# Patient Record
Sex: Female | Born: 1940 | Race: Black or African American | Hispanic: No | Marital: Married | Smoking: Current every day smoker
Health system: Southern US, Community
[De-identification: ages and names within clinical notes are randomized; demographics above are authoritative.]

## PROBLEM LIST (undated history)

## (undated) DIAGNOSIS — N189 Chronic kidney disease, unspecified: Secondary | ICD-10-CM

## (undated) DIAGNOSIS — K2270 Barrett's esophagus without dysplasia: Secondary | ICD-10-CM

## (undated) DIAGNOSIS — K219 Gastro-esophageal reflux disease without esophagitis: Secondary | ICD-10-CM

## (undated) DIAGNOSIS — E785 Hyperlipidemia, unspecified: Secondary | ICD-10-CM

## (undated) DIAGNOSIS — I701 Atherosclerosis of renal artery: Secondary | ICD-10-CM

## (undated) DIAGNOSIS — D649 Anemia, unspecified: Secondary | ICD-10-CM

## (undated) DIAGNOSIS — I6523 Occlusion and stenosis of bilateral carotid arteries: Secondary | ICD-10-CM

## (undated) DIAGNOSIS — I10 Essential (primary) hypertension: Secondary | ICD-10-CM

## (undated) DIAGNOSIS — E213 Hyperparathyroidism, unspecified: Secondary | ICD-10-CM

## (undated) HISTORY — PX: COLON SURGERY: SHX602

## (undated) HISTORY — PX: OTHER SURGICAL HISTORY: SHX169

## (undated) HISTORY — PX: RENAL ARTERY STENT: SHX2321

## (undated) HISTORY — PX: COLON RESECTION: SHX5231

---

## 2004-10-13 ENCOUNTER — Ambulatory Visit: Payer: Self-pay | Admitting: Family Medicine

## 2004-10-28 ENCOUNTER — Ambulatory Visit: Payer: Self-pay | Admitting: Family Medicine

## 2004-12-08 ENCOUNTER — Ambulatory Visit: Payer: Self-pay | Admitting: Family Medicine

## 2005-10-28 ENCOUNTER — Ambulatory Visit: Payer: Self-pay | Admitting: Internal Medicine

## 2005-11-02 ENCOUNTER — Ambulatory Visit: Payer: Self-pay | Admitting: Family Medicine

## 2005-11-12 ENCOUNTER — Other Ambulatory Visit: Payer: Self-pay

## 2005-11-16 ENCOUNTER — Inpatient Hospital Stay: Payer: Self-pay | Admitting: General Surgery

## 2006-03-26 ENCOUNTER — Emergency Department: Payer: Self-pay | Admitting: Emergency Medicine

## 2006-03-26 ENCOUNTER — Other Ambulatory Visit: Payer: Self-pay

## 2006-12-13 ENCOUNTER — Ambulatory Visit: Payer: Self-pay | Admitting: Family Medicine

## 2006-12-28 ENCOUNTER — Ambulatory Visit: Payer: Self-pay | Admitting: Family Medicine

## 2007-01-19 ENCOUNTER — Ambulatory Visit: Payer: Self-pay | Admitting: Vascular Surgery

## 2007-01-24 ENCOUNTER — Ambulatory Visit: Payer: Self-pay | Admitting: Gastroenterology

## 2007-08-10 ENCOUNTER — Ambulatory Visit: Payer: Self-pay | Admitting: Family Medicine

## 2008-01-16 ENCOUNTER — Ambulatory Visit: Payer: Self-pay | Admitting: Gastroenterology

## 2008-02-08 ENCOUNTER — Ambulatory Visit: Payer: Self-pay | Admitting: Family Medicine

## 2008-03-15 ENCOUNTER — Ambulatory Visit: Payer: Self-pay | Admitting: Gastroenterology

## 2008-03-28 ENCOUNTER — Other Ambulatory Visit: Payer: Self-pay

## 2008-03-29 ENCOUNTER — Inpatient Hospital Stay: Payer: Self-pay | Admitting: Internal Medicine

## 2008-10-16 ENCOUNTER — Inpatient Hospital Stay: Payer: Self-pay | Admitting: Internal Medicine

## 2010-03-26 ENCOUNTER — Ambulatory Visit: Payer: Self-pay | Admitting: Gastroenterology

## 2011-04-05 ENCOUNTER — Observation Stay: Payer: Self-pay | Admitting: Internal Medicine

## 2011-04-08 ENCOUNTER — Ambulatory Visit: Payer: Self-pay | Admitting: Vascular Surgery

## 2012-04-10 ENCOUNTER — Ambulatory Visit: Payer: Self-pay | Admitting: Family Medicine

## 2012-08-22 ENCOUNTER — Ambulatory Visit: Payer: Self-pay | Admitting: Oncology

## 2012-08-25 ENCOUNTER — Ambulatory Visit: Payer: Self-pay | Admitting: Oncology

## 2013-02-13 ENCOUNTER — Ambulatory Visit: Payer: Self-pay | Admitting: Oncology

## 2014-02-12 ENCOUNTER — Ambulatory Visit: Payer: Self-pay | Admitting: Family Medicine

## 2014-02-15 ENCOUNTER — Ambulatory Visit: Payer: Self-pay | Admitting: Family Medicine

## 2014-12-17 ENCOUNTER — Ambulatory Visit: Payer: Self-pay | Admitting: Gastroenterology

## 2015-02-17 LAB — SURGICAL PATHOLOGY

## 2015-04-09 ENCOUNTER — Other Ambulatory Visit: Payer: Self-pay | Admitting: Family Medicine

## 2015-04-09 DIAGNOSIS — Z1231 Encounter for screening mammogram for malignant neoplasm of breast: Secondary | ICD-10-CM

## 2015-04-10 ENCOUNTER — Ambulatory Visit
Admission: RE | Admit: 2015-04-10 | Discharge: 2015-04-10 | Disposition: A | Discharge status: Home / Self Care | Payer: Medicare Other | Source: Ambulatory Visit | Attending: Family Medicine | Admitting: Family Medicine

## 2015-04-10 ENCOUNTER — Other Ambulatory Visit: Payer: Self-pay | Admitting: Family Medicine

## 2015-04-10 DIAGNOSIS — Z1231 Encounter for screening mammogram for malignant neoplasm of breast: Secondary | ICD-10-CM | POA: Insufficient documentation

## 2016-04-14 ENCOUNTER — Other Ambulatory Visit: Payer: Self-pay | Admitting: Family Medicine

## 2016-04-14 DIAGNOSIS — Z1231 Encounter for screening mammogram for malignant neoplasm of breast: Secondary | ICD-10-CM

## 2016-05-06 ENCOUNTER — Ambulatory Visit
Admission: RE | Admit: 2016-05-06 | Discharge: 2016-05-06 | Disposition: A | Discharge status: Home / Self Care | Payer: Medicare Other | Source: Ambulatory Visit | Attending: Family Medicine | Admitting: Family Medicine

## 2016-05-06 DIAGNOSIS — Z1231 Encounter for screening mammogram for malignant neoplasm of breast: Secondary | ICD-10-CM | POA: Insufficient documentation

## 2018-08-28 ENCOUNTER — Other Ambulatory Visit (INDEPENDENT_AMBULATORY_CARE_PROVIDER_SITE_OTHER): Payer: Self-pay | Admitting: Vascular Surgery

## 2018-08-28 ENCOUNTER — Encounter (INDEPENDENT_AMBULATORY_CARE_PROVIDER_SITE_OTHER): Payer: Medicare Other

## 2018-08-28 DIAGNOSIS — N186 End stage renal disease: Secondary | ICD-10-CM

## 2018-08-28 DIAGNOSIS — Z992 Dependence on renal dialysis: Principal | ICD-10-CM

## 2018-11-10 ENCOUNTER — Encounter (INDEPENDENT_AMBULATORY_CARE_PROVIDER_SITE_OTHER): Payer: Self-pay

## 2018-11-12 DIAGNOSIS — N189 Chronic kidney disease, unspecified: Secondary | ICD-10-CM

## 2018-11-12 DIAGNOSIS — D631 Anemia in chronic kidney disease: Secondary | ICD-10-CM | POA: Insufficient documentation

## 2018-11-12 NOTE — Progress Notes (Deleted)
  [place]  Telephone:(336) [phone] [phone]) [phone]  ID: Anita Wells OB: 10-18-1941  MR#: [id number]  UXN#:[id number]  Patient Care Team: Dion Body, MD as PCP - General (Family Medicine)  CHIEF COMPLAINT: Anemia secondary to chronic kidney disease  INTERVAL HISTORY: ***  REVIEW OF SYSTEMS:   ROS  As per HPI. Otherwise, a complete review of systems is negative.  PAST MEDICAL HISTORY: No past medical history on file.  PAST SURGICAL HISTORY: No past surgical history on file.  FAMILY HISTORY: No family history on file.  ADVANCED DIRECTIVES (Y/N):  N  HEALTH MAINTENANCE: Social History   Tobacco Use  . Smoking status: Not on file  Substance Use Topics  . Alcohol use: Not on file  . Drug use: Not on file     Colonoscopy:  PAP:  Bone density:  Lipid panel:  Allergies not on file  No current outpatient medications on file.   No current facility-administered medications for this visit.     OBJECTIVE: There were no vitals filed for this visit.   There is no height or weight on file to calculate BMI.    ECOG FS:{CHL ONC [id number]  General: Well-developed, well-nourished, no acute distress. Eyes: Pink conjunctiva, anicteric sclera. HEENT: Normocephalic, moist mucous membranes, clear oropharnyx. Lungs: Clear to auscultation bilaterally. Heart: Regular rate and rhythm. No rubs, murmurs, or gallops. Abdomen: Soft, nontender, nondistended. No organomegaly noted, normoactive bowel sounds. Musculoskeletal: No edema, cyanosis, or clubbing. Neuro: Alert, answering all questions appropriately. Cranial nerves grossly intact. Skin: No rashes or petechiae noted. Psych: Normal affect. Lymphatics: No cervical, calvicular, axillary or inguinal LAD.   LAB RESULTS:  No results found for: NA, K, CL, CO2, GLUCOSE, BUN, CREATININE, CALCIUM, PROT, ALBUMIN, AST, ALT, ALKPHOS, BILITOT, GFRNONAA, GFRAA  No results found for: WBC, NEUTROABS,  HGB, HCT, MCV, PLT   STUDIES: No results found.  ASSESSMENT: Anemia secondary to chronic kidney disease  PLAN:    1. Anemia secondary to chronic kidney disease:  Patient expressed understanding and was in agreement with this plan. She also understands that She can call clinic at any time with any questions, concerns, or complaints.   Cancer Staging No matching staging information was found for the patient.  [name], MD   11/12/2018 9:03 AM

## 2018-11-14 ENCOUNTER — Other Ambulatory Visit (INDEPENDENT_AMBULATORY_CARE_PROVIDER_SITE_OTHER): Payer: Self-pay | Admitting: Nurse Practitioner

## 2018-11-14 MED ORDER — CEFAZOLIN SODIUM-DEXTROSE 1-4 GM/50ML-% IV SOLN
1.0000 g | Freq: Once | INTRAVENOUS | Status: AC
  Administered 2018-11-15: 1 g via INTRAVENOUS

## 2018-11-15 ENCOUNTER — Other Ambulatory Visit: Payer: Self-pay

## 2018-11-15 ENCOUNTER — Encounter (INDEPENDENT_AMBULATORY_CARE_PROVIDER_SITE_OTHER): Payer: Medicare Other

## 2018-11-15 ENCOUNTER — Encounter
Admission: RE | Disposition: A | Discharge status: Home / Self Care | Payer: Self-pay | Source: Home / Self Care | Attending: Vascular Surgery

## 2018-11-15 ENCOUNTER — Other Ambulatory Visit (INDEPENDENT_AMBULATORY_CARE_PROVIDER_SITE_OTHER): Payer: Medicare Other

## 2018-11-15 ENCOUNTER — Ambulatory Visit
Admission: RE | Admit: 2018-11-15 | Discharge: 2018-11-15 | Disposition: A | Discharge status: Home / Self Care | Payer: Medicare Other | Attending: Vascular Surgery | Admitting: Vascular Surgery

## 2018-11-15 ENCOUNTER — Encounter (INDEPENDENT_AMBULATORY_CARE_PROVIDER_SITE_OTHER): Payer: Medicare Other | Admitting: Vascular Surgery

## 2018-11-15 DIAGNOSIS — F17200 Nicotine dependence, unspecified, uncomplicated: Secondary | ICD-10-CM

## 2018-11-15 DIAGNOSIS — Z992 Dependence on renal dialysis: Secondary | ICD-10-CM

## 2018-11-15 DIAGNOSIS — Z4901 Encounter for fitting and adjustment of extracorporeal dialysis catheter: Secondary | ICD-10-CM | POA: Insufficient documentation

## 2018-11-15 DIAGNOSIS — I120 Hypertensive chronic kidney disease with stage 5 chronic kidney disease or end stage renal disease: Secondary | ICD-10-CM

## 2018-11-15 DIAGNOSIS — I701 Atherosclerosis of renal artery: Secondary | ICD-10-CM | POA: Insufficient documentation

## 2018-11-15 DIAGNOSIS — E785 Hyperlipidemia, unspecified: Secondary | ICD-10-CM | POA: Diagnosis not present

## 2018-11-15 DIAGNOSIS — E213 Hyperparathyroidism, unspecified: Secondary | ICD-10-CM | POA: Insufficient documentation

## 2018-11-15 DIAGNOSIS — I6523 Occlusion and stenosis of bilateral carotid arteries: Secondary | ICD-10-CM | POA: Insufficient documentation

## 2018-11-15 DIAGNOSIS — N186 End stage renal disease: Secondary | ICD-10-CM | POA: Insufficient documentation

## 2018-11-15 DIAGNOSIS — F17210 Nicotine dependence, cigarettes, uncomplicated: Secondary | ICD-10-CM | POA: Insufficient documentation

## 2018-11-15 DIAGNOSIS — K219 Gastro-esophageal reflux disease without esophagitis: Secondary | ICD-10-CM | POA: Insufficient documentation

## 2018-11-15 DIAGNOSIS — N189 Chronic kidney disease, unspecified: Secondary | ICD-10-CM

## 2018-11-15 HISTORY — DX: Barrett's esophagus without dysplasia: K22.70

## 2018-11-15 HISTORY — DX: Essential (primary) hypertension: I10

## 2018-11-15 HISTORY — DX: Anemia, unspecified: D64.9

## 2018-11-15 HISTORY — DX: Hyperparathyroidism, unspecified: E21.3

## 2018-11-15 HISTORY — DX: Gastro-esophageal reflux disease without esophagitis: K21.9

## 2018-11-15 HISTORY — DX: Occlusion and stenosis of bilateral carotid arteries: I65.23

## 2018-11-15 HISTORY — DX: Chronic kidney disease, unspecified: N18.9

## 2018-11-15 HISTORY — PX: DIALYSIS/PERMA CATHETER INSERTION: CATH118288

## 2018-11-15 HISTORY — DX: Atherosclerosis of renal artery: I70.1

## 2018-11-15 HISTORY — DX: Hyperlipidemia, unspecified: E78.5

## 2018-11-15 LAB — POTASSIUM (ARMC VASCULAR LAB ONLY): Potassium (ARMC vascular lab): 4.3 (ref 3.5–5.1)

## 2018-11-15 SURGERY — DIALYSIS/PERMA CATHETER INSERTION
Anesthesia: Moderate Sedation

## 2018-11-15 MED ORDER — FENTANYL CITRATE (PF) 100 MCG/2ML IJ SOLN
INTRAMUSCULAR | Status: DC | PRN
  Administered 2018-11-15: 25 ug via INTRAVENOUS
  Administered 2018-11-15: 50 ug via INTRAVENOUS

## 2018-11-15 MED ORDER — MIDAZOLAM HCL 2 MG/2ML IJ SOLN
INTRAMUSCULAR | Status: DC | PRN
  Administered 2018-11-15: 2 mg via INTRAVENOUS
  Administered 2018-11-15: 1 mg via INTRAVENOUS

## 2018-11-15 MED ORDER — HEPARIN (PORCINE) IN NACL 1000-0.9 UT/500ML-% IV SOLN
INTRAVENOUS | Status: AC
  Filled 2018-11-15: qty 500

## 2018-11-15 MED ORDER — FENTANYL CITRATE (PF) 100 MCG/2ML IJ SOLN
INTRAMUSCULAR | Status: AC
  Filled 2018-11-15: qty 2

## 2018-11-15 MED ORDER — HYDROMORPHONE HCL 1 MG/ML IJ SOLN: 1.0000 mg | Freq: Once | INTRAMUSCULAR | Status: DC | PRN

## 2018-11-15 MED ORDER — MIDAZOLAM HCL 5 MG/5ML IJ SOLN
INTRAMUSCULAR | Status: AC
  Filled 2018-11-15: qty 5

## 2018-11-15 MED ORDER — HEPARIN SODIUM (PORCINE) 10000 UNIT/ML IJ SOLN
INTRAMUSCULAR | Status: AC
  Filled 2018-11-15: qty 1

## 2018-11-15 MED ORDER — CEFAZOLIN SODIUM-DEXTROSE 1-4 GM/50ML-% IV SOLN
INTRAVENOUS | Status: AC
  Administered 2018-11-15: 1 g via INTRAVENOUS
  Filled 2018-11-15: qty 50

## 2018-11-15 MED ORDER — ONDANSETRON HCL 4 MG/2ML IJ SOLN: 4.0000 mg | Freq: Four times a day (QID) | INTRAMUSCULAR | Status: DC | PRN

## 2018-11-15 MED ORDER — LIDOCAINE-EPINEPHRINE (PF) 1 %-1:200000 IJ SOLN
INTRAMUSCULAR | Status: AC
  Filled 2018-11-15: qty 30

## 2018-11-15 MED ORDER — SODIUM CHLORIDE 0.9 % IV SOLN
INTRAVENOUS | Status: DC
  Administered 2018-11-15: 1000 mL via INTRAVENOUS

## 2018-11-15 SURGICAL SUPPLY — 6 items
CATH PALINDROME RT-P 15FX19CM (CATHETERS) ×3 IMPLANT
DERMABOND ADVANCED (GAUZE/BANDAGES/DRESSINGS) ×2
DERMABOND ADVANCED .7 DNX12 (GAUZE/BANDAGES/DRESSINGS) ×1 IMPLANT
PACK ANGIOGRAPHY (CUSTOM PROCEDURE TRAY) ×3 IMPLANT
SUT MNCRL AB 4-0 PS2 18 (SUTURE) ×3 IMPLANT
SUT PROLENE 0 CT 1 30 (SUTURE) ×3 IMPLANT

## 2018-11-15 NOTE — H&P (Signed)
[facility] History & Physical Update  The patient was interviewed and re-examined.  The patient's previous History and Physical has been reviewed and is unchanged.  There is no change in the plan of care. We plan to proceed with the scheduled procedure.  Leotis Pain, MD  11/15/2018, 8:12 AM

## 2018-11-15 NOTE — Op Note (Signed)
OPERATIVE NOTE    PRE-OPERATIVE DIAGNOSIS: 1. ESRD  POST-OPERATIVE DIAGNOSIS: same as above  PROCEDURE: 1. Ultrasound guidance for vascular access to the right internal jugular vein 2. Fluoroscopic guidance for placement of catheter 3. Placement of a 19 cm tip to cuff tunneled hemodialysis catheter via the right internal jugular vein  SURGEON: Leotis Pain, MD  ANESTHESIA:  Local with Moderate conscious sedation for approximately 20 minutes using 3 mg of Versed and 75 mcg of Fentanyl  ESTIMATED BLOOD LOSS: 10 cc  FLUORO TIME: less than one minute  CONTRAST: none  FINDING(S): 1.  Patent right internal jugular vein  SPECIMEN(S):  None  INDICATIONS:   Anita Wells is a 79 y.o.female who presents with progressive renal failure now requiring dialysis.  The patient needs long term dialysis access for their ESRD, and a Permcath is necessary.  Risks and benefits are discussed and informed consent is obtained.    DESCRIPTION: After obtaining full informed written consent, the patient was brought back to the vascular suited. The patient's right neck and chest were sterilely prepped and draped in a sterile surgical field was created. Moderate conscious sedation was administered during a face to face encounter with the patient throughout the procedure with my supervision of the RN administering medicines and monitoring the patient's vital signs, pulse oximetry, telemetry and mental status throughout from the start of the procedure until the patient was taken to the recovery room.  The right internal jugular vein was visualized with ultrasound and found to be patent. It was then accessed under direct ultrasound guidance and a permanent image was recorded. A wire was placed. After skin nick and dilatation, the peel-away sheath was placed over the wire. I then turned my attention to an area under the clavicle. Approximately 1-2 fingerbreadths below the clavicle a small counterincision was created and  tunneled from the subclavicular incision to the access site. Using fluoroscopic guidance, a 19 centimeter tip to cuff tunneled hemodialysis catheter was selected, and tunneled from the subclavicular incision to the access site. It was then placed through the peel-away sheath and the peel-away sheath was removed. Using fluoroscopic guidance the catheter tips were parked in the right atrium. The appropriate distal connectors were placed. It withdrew blood well and flushed easily with heparinized saline and a concentrated heparin solution was then placed. It was secured to the chest wall with 2 Prolene sutures. The access incision was closed single 4-0 Monocryl. A 4-0 Monocryl pursestring suture was placed around the exit site. Sterile dressings were placed. The patient tolerated the procedure well and was taken to the recovery room in stable condition.  COMPLICATIONS: None  CONDITION: Stable  Leotis Pain, MD 11/15/2018 10:01 AM   This note was created with Dragon Medical transcription system. Any errors in dictation are purely unintentional.

## 2018-11-15 NOTE — H&P (Signed)
[facility] Admission History & Physical  MRN : [id number]  Anita Wells is a 78 y.o. (1941/09/04) female who presents with chief complaint of No chief complaint on file. Marland Kitchen  History of Present Illness: Patient is referred from Dr. Juleen China for placement of a dialysis catheter for initiation of dialysis.  Patient reports what sounds like longstanding chronic kidney disease.  She does not know her last GFR.  She has been following with her nephrologist and his PA and with the steady decline in her renal function they have decided to initiate dialysis.  Some swelling is present and some lethargy but no severe shortness of breath.  No fevers or chills.  The patient apparently has a previous history of renal artery stenosis and hypertension that are likely the causes of her renal failure.  Current Facility-Administered Medications  Medication Dose Route Frequency Provider Last Rate Last Dose  . 0.9 %  sodium chloride infusion   Intravenous Continuous Kris Hartmann, NP 10 mL/hr at 11/15/18 0901 1,000 mL at 11/15/18 0901  . ceFAZolin (ANCEF) IVPB 1 g/50 mL premix  1 g Intravenous Once Kris Hartmann, NP 100 mL/hr at 11/15/18 0927 1 g at 11/15/18 0927  . HYDROmorphone (DILAUDID) injection 1 mg  1 mg Intravenous Once PRN Kris Hartmann, NP      . ondansetron [facility]) injection 4 mg  4 mg Intravenous Q6H PRN Kris Hartmann, NP        Past Medical History:  Diagnosis Date  . Anemia   . Barrett's esophagus   . Carotid stenosis, bilateral   . Chronic kidney disease   . GERD (gastroesophageal reflux disease)   . Hyperlipidemia   . Hyperparathyroidism ([place])   . Hypertension   . Renal artery stenosis ([facility])    left    Past Surgical History:  Procedure Laterality Date  . COLON RESECTION    . COLON SURGERY    . colonoscpy    . RENAL ARTERY STENT Left     Social History Social History   Tobacco Use  . Smoking status: Current Every Day Smoker    Packs/day: 0.25    Years: 50.00    Pack years: 12.50  . Smokeless tobacco: Never Used  Substance Use Topics  . Alcohol use: Not Currently  . Drug use: Never    Family History No bleeding disorders, clotting disorders, autoimmune diseases, or porphyria  No Known Allergies   REVIEW OF SYSTEMS (Negative unless checked)  Constitutional: [] Weight loss  [] Fever  [] Chills Cardiac: [] Chest pain   [] Chest pressure   [] Palpitations   [] Shortness of breath when laying flat   [] Shortness of breath at rest   [] Shortness of breath with exertion. Vascular:  [] Pain in legs with walking   [] Pain in legs at rest   [] Pain in legs when laying flat   [] Claudication   [] Pain in feet when walking  [] Pain in feet at rest  [] Pain in feet when laying flat   [] History of DVT   [] Phlebitis   [] Swelling in legs   [] Varicose veins   [] Non-healing ulcers Pulmonary:   [] Uses home oxygen   [] Productive cough   [] Hemoptysis   [] Wheeze  [] COPD   [] Asthma Neurologic:  [] Dizziness  [] Blackouts   [] Seizures   [] History of stroke   [] History of TIA  [] Aphasia   [] Temporary blindness   [] Dysphagia   [] Weakness or numbness in arms   [] Weakness or numbness in legs Musculoskeletal:  [x] Arthritis   []   Joint swelling   [] Joint pain   [] Low back pain Hematologic:  [] Easy bruising  [] Easy bleeding   [] Hypercoagulable state   [x] Anemic  [] Hepatitis Gastrointestinal:  [] Blood in stool   [] Vomiting blood  [x] Gastroesophageal reflux/heartburn   [] Difficulty swallowing. Genitourinary:  [x] Chronic kidney disease   [] Difficult urination  [] Frequent urination  [] Burning with urination   [] Blood in urine Skin:  [] Rashes   [] Ulcers   [] Wounds Psychological:  [] History of anxiety   []  History of major depression.  Physical Examination  Vitals:   11/15/18 0837 11/15/18 0930  BP: (!) 148/60   Pulse: 68   Resp: (!) 22   Temp: (!) 97.5 F (36.4 C)   TempSrc: Oral   SpO2: 96% 99%  Weight: 55.3 kg   Height: 5\' 6"  (1.676 m)    Body mass index is 19.69  kg/m. Gen: WD/WN, NAD Head: [place]/AT, No temporalis wasting.  Ear/Nose/Throat: Hearing grossly intact, nares w/o erythema or drainage, oropharynx w/o Erythema/Exudate,  Eyes: Conjunctiva clear, sclera non-icteric Neck: Trachea midline.  No JVD.  Pulmonary:  Good air movement, respirations not labored, no use of accessory muscles.  Cardiac: RRR, no JVD Vascular:  Vessel Right Left  Radial Palpable Palpable   Musculoskeletal: M/S 5/5 throughout.  Extremities without ischemic changes.  No deformity or atrophy.  Neurologic: Sensation grossly intact in extremities.  Symmetrical.  Speech is fluent. Motor exam as listed above. Psychiatric: Judgment intact, Mood & affect appropriate for pt's clinical situation. Dermatologic: No rashes or ulcers noted.  No cellulitis or open wounds.      CBC No results found for: WBC, HGB, HCT, MCV, PLT  BMET No results found for: NA, K, CL, CO2, GLUCOSE, BUN, CREATININE, CALCIUM, GFRNONAA, GFRAA CrCl cannot be calculated (No successful lab value found.).  COAG No results found for: INR, PROTIME  Radiology No results found.    Assessment/Plan 1.  Renal failure.  Has had longstanding chronic kidney disease and is now felt to be end-stage renal disease requiring dialysis.  As such, a PermCath will be placed today.  Would recommend an outpatient work-up with vein mapping to evaluate for fistula creation.  Patient voices her understanding and agrees to proceed with PermCath today 2.  Hypertension.  Likely an underlying cause of her renal failure and blood pressure control important in reducing the progression of atherosclerotic disease. On appropriate oral medications. 3.  Renal artery stenosis.  Apparently status post intervention some years ago.  I do not have record of that today or when it was last checked.  This may be beneficial to evaluate as an outpatient going forward.   Leotis Pain, MD  11/15/2018 9:35 AM

## 2018-11-16 ENCOUNTER — Inpatient Hospital Stay: Payer: Medicare Other | Admitting: Oncology

## 2018-11-19 NOTE — Progress Notes (Deleted)
[place]  Telephone:(336) [phone] Fax:(336) [phone]  ID: Anita Wells OB: [birth year]  MR#: [id number]  YDX#:[id number]  Patient Care Team: Dion Body, MD as PCP - General (Family Medicine)  CHIEF COMPLAINT: Anemia secondary to chronic kidney disease  INTERVAL HISTORY: ***  REVIEW OF SYSTEMS:   ROS  As per HPI. Otherwise, a complete review of systems is negative.  PAST MEDICAL HISTORY: Past Medical History:  Diagnosis Date  . Anemia   . Barrett's esophagus   . Carotid stenosis, bilateral   . Chronic kidney disease   . GERD (gastroesophageal reflux disease)   . Hyperlipidemia   . Hyperparathyroidism ([place])   . Hypertension   . Renal artery stenosis (HCC)    left    PAST SURGICAL HISTORY: Past Surgical History:  Procedure Laterality Date  . COLON RESECTION    . COLON SURGERY    . colonoscpy    . DIALYSIS/PERMA CATHETER INSERTION N/A 11/15/2018   Procedure: DIALYSIS/PERMA CATHETER INSERTION;  Surgeon: Algernon Huxley, MD;  Location: [facility];  Service: Cardiovascular;  Laterality: N/A;  . RENAL ARTERY STENT Left     FAMILY HISTORY: No family history on file.  ADVANCED DIRECTIVES (Y/N):  N  HEALTH MAINTENANCE: Social History   Tobacco Use  . Smoking status: Current Every Day Smoker    Packs/day: 0.25    Years: 50.00    Pack years: 12.50  . Smokeless tobacco: Never Used  Substance Use Topics  . Alcohol use: Not Currently  . Drug use: Never     Colonoscopy:  PAP:  Bone density:  Lipid panel:  No Known Allergies  Current Outpatient Medications  Medication Sig Dispense Refill  . aspirin EC 81 MG tablet Take 81 mg by mouth daily.    Marland Kitchen atorvastatin (LIPITOR) 40 MG tablet Take 40 mg by mouth daily at 6 PM.    . calcitRIOL (ROCALTROL) 0.25 MCG capsule Take 0.25 mcg by mouth 3 (three) times a week. Take on Mondays, Wednesdays, and Fridays    . cloNIDine (CATAPRES) 0.2 MG tablet Take 0.2 mg by mouth 2 (two) times daily.  Take 1 tablet in am and 2 tablets in pm    . docusate sodium (COLACE) 100 MG capsule Take 100 mg by mouth 2 (two) times daily.    Marland Kitchen escitalopram (LEXAPRO) 10 MG tablet Take 10 mg by mouth daily.    . ferrous sulfate 325 (65 FE) MG tablet Take 325 mg by mouth daily with breakfast.    . furosemide (LASIX) 80 MG tablet Take 80 mg by mouth daily.    . hydrALAZINE (APRESOLINE) 100 MG tablet Take 100 mg by mouth 3 (three) times daily.    Marland Kitchen losartan (COZAAR) 100 MG tablet Take 100 mg by mouth daily.    . metoprolol tartrate (LOPRESSOR) 100 MG tablet Take 100 mg by mouth 2 (two) times daily.    . mirtazapine (REMERON) 7.5 MG tablet Take 7.5 mg by mouth at bedtime.    Marland Kitchen omeprazole (PRILOSEC) 40 MG capsule Take 40 mg by mouth daily.    . sodium bicarbonate 325 MG tablet Take 325 mg by mouth 2 (two) times daily.    Marland Kitchen torsemide (DEMADEX) 10 MG tablet Take 10 mg by mouth daily. May take 1 additional tablet as needed for swelling     No current facility-administered medications for this visit.     OBJECTIVE: There were no vitals filed for this visit.   There is no height or  weight on file to calculate BMI.    ECOG FS:{CHL ONC [id number]  General: Well-developed, well-nourished, no acute distress. Eyes: Pink conjunctiva, anicteric sclera. HEENT: Normocephalic, moist mucous membranes, clear oropharnyx. Lungs: Clear to auscultation bilaterally. Heart: Regular rate and rhythm. No rubs, murmurs, or gallops. Abdomen: Soft, nontender, nondistended. No organomegaly noted, normoactive bowel sounds. Musculoskeletal: No edema, cyanosis, or clubbing. Neuro: Alert, answering all questions appropriately. Cranial nerves grossly intact. Skin: No rashes or petechiae noted. Psych: Normal affect. Lymphatics: No cervical, calvicular, axillary or inguinal LAD.   LAB RESULTS:  No results found for: NA, K, CL, CO2, GLUCOSE, BUN, CREATININE, CALCIUM, PROT, ALBUMIN, AST, ALT, ALKPHOS, BILITOT, GFRNONAA, GFRAA  No  results found for: WBC, NEUTROABS, HGB, HCT, MCV, PLT   STUDIES: No results found.  ASSESSMENT: Anemia secondary to chronic kidney disease  PLAN:    1. Anemia secondary to chronic kidney disease:  Patient expressed understanding and was in agreement with this plan. She also understands that She can call clinic at any time with any questions, concerns, or complaints.   Cancer Staging No matching staging information was found for the patient.  [name], MD   11/19/2018 11:09 PM

## 2018-11-21 ENCOUNTER — Inpatient Hospital Stay: Payer: Medicare Other | Admitting: Oncology

## 2018-11-22 ENCOUNTER — Other Ambulatory Visit (INDEPENDENT_AMBULATORY_CARE_PROVIDER_SITE_OTHER): Payer: Self-pay | Admitting: Vascular Surgery

## 2018-11-22 ENCOUNTER — Ambulatory Visit (INDEPENDENT_AMBULATORY_CARE_PROVIDER_SITE_OTHER): Payer: Medicare Other

## 2018-11-22 ENCOUNTER — Encounter (INDEPENDENT_AMBULATORY_CARE_PROVIDER_SITE_OTHER): Payer: Self-pay | Admitting: Vascular Surgery

## 2018-11-22 ENCOUNTER — Ambulatory Visit (INDEPENDENT_AMBULATORY_CARE_PROVIDER_SITE_OTHER): Payer: Medicare Other | Admitting: Vascular Surgery

## 2018-11-22 VITALS — BP 147/58 | HR 56 | Resp 16 | Ht 66.0 in | Wt 116.4 lb

## 2018-11-22 DIAGNOSIS — N186 End stage renal disease: Secondary | ICD-10-CM

## 2018-11-22 DIAGNOSIS — E785 Hyperlipidemia, unspecified: Secondary | ICD-10-CM

## 2018-11-22 DIAGNOSIS — D631 Anemia in chronic kidney disease: Secondary | ICD-10-CM

## 2018-11-22 DIAGNOSIS — I120 Hypertensive chronic kidney disease with stage 5 chronic kidney disease or end stage renal disease: Secondary | ICD-10-CM

## 2018-11-22 DIAGNOSIS — Z992 Dependence on renal dialysis: Secondary | ICD-10-CM | POA: Diagnosis not present

## 2018-11-22 DIAGNOSIS — F17200 Nicotine dependence, unspecified, uncomplicated: Secondary | ICD-10-CM

## 2018-11-22 DIAGNOSIS — N185 Chronic kidney disease, stage 5: Secondary | ICD-10-CM

## 2018-11-22 DIAGNOSIS — I10 Essential (primary) hypertension: Secondary | ICD-10-CM | POA: Insufficient documentation

## 2018-11-22 NOTE — Progress Notes (Signed)
Subjective:    Patient ID: Anita Wells, female    DOB: Nov 09, 1940, 78 y.o.   MRN: [id number] Chief Complaint  Patient presents with  . Establish Care    Patient presents to undergo bilateral upper extremity vein mapping for permanent dialysis access creation.  After progressively worsening kidney function the patient is status post a right IJ PermCath placed by Dr. Lazaro Arms on November 15, 2018.  The patient denies any issues with her PermCath.  States that it is functioning well.  At this time, the patient denies any uremic symptoms, shortness of breath or swelling to the bilateral lower extremity.  The patient is still in need of a permanent dialysis access and therefore is underwent upper extremity bilateral vein mapping today.  The patient has triphasic waveforms noted in the brachial, radial and ulnar arteries bilaterally.  Upper extremity vein mapping was notable for veins to small caliber for creation of a fistula.  The patient is right-hand dominant.  The patients would be amicable to a left brachial axillary graft creation.  The patient denies any fever, nausea or vomiting.  Review of Systems  Constitutional: Negative.   HENT: Negative.   Eyes: Negative.   Respiratory: Negative.   Cardiovascular: Negative.   Gastrointestinal: Negative.   Endocrine: Negative.   Genitourinary:       ESRD  Musculoskeletal: Negative.   Skin: Negative.   Allergic/Immunologic: Negative.   Neurological: Negative.   Hematological: Negative.   Psychiatric/Behavioral: Negative.       Objective:   Physical Exam Vitals signs reviewed.  Constitutional:      Appearance: Normal appearance.  HENT:     Head: Normocephalic and atraumatic.     Right Ear: External ear normal.     Left Ear: External ear normal.     Nose: Nose normal.     Mouth/Throat:     Mouth: Mucous membranes are dry.     Pharynx: Oropharynx is clear.  Eyes:     Extraocular Movements: Extraocular movements intact.     Conjunctiva/sclera:  Conjunctivae normal.     Pupils: Pupils are equal, round, and reactive to light.  Neck:     Musculoskeletal: Normal range of motion.  Cardiovascular:     Rate and Rhythm: Normal rate and regular rhythm.     Comments: Right IJ Permcath: Intact. No infection noted.  Pulmonary:     Effort: Pulmonary effort is normal.     Breath sounds: Normal breath sounds.  Musculoskeletal: Normal range of motion.        General: No swelling.  Skin:    General: Skin is warm and dry.  Neurological:     General: No focal deficit present.     Mental Status: She is alert and oriented to person, place, and time. Mental status is at baseline.  Psychiatric:        Mood and Affect: Mood normal.        Behavior: Behavior normal.        Thought Content: Thought content normal.        Judgment: Judgment normal.    BP (!) 147/58 (BP Location: Right Arm, Patient Position: Sitting, Cuff Size: Normal)   Pulse (!) 56   Resp 16   Ht 5\' 6"  (1.676 m)   Wt 116 lb 6.4 oz (52.8 kg)   BMI 18.79 kg/m    Past Medical History:  Diagnosis Date  . Anemia   . Barrett's esophagus   . Carotid stenosis, bilateral   .  Chronic kidney disease   . GERD (gastroesophageal reflux disease)   . Hyperlipidemia   . Hyperparathyroidism ([place])   . Hypertension   . Renal artery stenosis (HCC)    left   Social History   Socioeconomic History  . Marital status: Married    Spouse name: Not on file  . Number of children: Not on file  . Years of education: Not on file  . Highest education level: Not on file  Occupational History  . Not on file  Social Needs  . Financial resource strain: Not on file  . Food insecurity:    Worry: Not on file    Inability: Not on file  . Transportation needs:    Medical: Not on file    Non-medical: Not on file  Tobacco Use  . Smoking status: Current Every Day Smoker    Packs/day: 0.25    Years: 50.00    Pack years: 12.50  . Smokeless tobacco: Never Used  Substance and Sexual Activity  .  Alcohol use: Not Currently  . Drug use: Never  . Sexual activity: Not on file  Lifestyle  . Physical activity:    Days per week: Not on file    Minutes per session: Not on file  . Stress: Not on file  Relationships  . Social connections:    Talks on phone: Not on file    Gets together: Not on file    Attends religious service: Not on file    Active member of club or organization: Not on file    Attends meetings of clubs or organizations: Not on file    Relationship status: Not on file  . Intimate partner violence:    Fear of current or ex partner: Not on file    Emotionally abused: Not on file    Physically abused: Not on file    Forced sexual activity: Not on file  Other Topics Concern  . Not on file  Social History Narrative  . Not on file    Past Surgical History:  Procedure Laterality Date  . COLON RESECTION    . COLON SURGERY    . colonoscpy    . DIALYSIS/PERMA CATHETER INSERTION N/A 11/15/2018   Procedure: DIALYSIS/PERMA CATHETER INSERTION;  Surgeon: Algernon Huxley, MD;  Location: [facility];  Service: Cardiovascular;  Laterality: N/A;  . RENAL ARTERY STENT Left    No family history on file.  Allergies  Allergen Reactions  . Ace Inhibitors Cough  . Amlodipine Other (See Comments)    Peripheral edema  . Clopidogrel Other (See Comments)    Mouth swelling      Assessment & Plan:   Patient presents to undergo bilateral upper extremity vein mapping for permanent dialysis access creation.  After progressively worsening kidney function the patient is status post a right IJ PermCath placed by Dr. Lazaro Arms on November 15, 2018.  The patient denies any issues with her PermCath.  States that it is functioning well.  At this time, the patient denies any uremic symptoms, shortness of breath or swelling to the bilateral lower extremity.  The patient is still in need of a permanent dialysis access and therefore is underwent upper extremity bilateral vein mapping today.  The  patient has triphasic waveforms noted in the brachial, radial and ulnar arteries bilaterally.  Upper extremity vein mapping was notable for veins to small caliber for creation of a fistula.  The patient is right-hand dominant.  The patients would be amicable to  a left brachial axillary graft creation.  The patient denies any fever, nausea or vomiting.  1. ESRD on dialysis [facility] - New The patient's known history of chronic kidney disease has progressed to the point that she is now in need of dialysis. The patient had a PermCath placed by Dr. Lucky Cowboy on November 15, 2018 initiate dialysis. The patient's permacath is functioning appropriately however this is not a permanent dialysis access. Bilateral upper extremity vein mapping was notable for creation of a left brachial axillary graft as the patient's anatomy was too small for fistula creation and she is right-hand dominant. Procedure, risks and benefits were explained to the patient and her family member present with her today. All questions were answered. Patient would like to proceed.  2. Anemia due to stage 5 chronic kidney disease, not on chronic dialysis ([place]) - Stable This is followed by the patient's nephrologist and primary care physician  3. Essential hypertension - Stable Encouraged good control as its slows the progression of atherosclerotic disease  4. Hyperlipidemia, unspecified hyperlipidemia type - Stable Encouraged good control as its slows the progression of atherosclerotic disease  Current Outpatient Medications on File Prior to Visit  Medication Sig Dispense Refill  . aspirin EC 81 MG tablet Take 81 mg by mouth daily.    Marland Kitchen atorvastatin (LIPITOR) 40 MG tablet Take 40 mg by mouth daily at 6 PM.    . calcitRIOL (ROCALTROL) 0.25 MCG capsule Take 0.25 mcg by mouth 3 (three) times a week. Take on Mondays, Wednesdays, and Fridays    . cloNIDine (CATAPRES) 0.2 MG tablet Take 0.2 mg by mouth 2 (two) times daily. Take 1 tablet in am and  2 tablets in pm    . docusate sodium (COLACE) 100 MG capsule Take 100 mg by mouth 2 (two) times daily.    Marland Kitchen doxycycline (VIBRA-TABS) 100 MG tablet Take by mouth 2 (two) times daily. For 10 days    . escitalopram (LEXAPRO) 10 MG tablet Take 10 mg by mouth daily.    . ferrous sulfate 325 (65 FE) MG tablet Take 325 mg by mouth daily with breakfast.    . furosemide (LASIX) 80 MG tablet Take 80 mg by mouth daily.    . hydrALAZINE (APRESOLINE) 100 MG tablet Take 100 mg by mouth 3 (three) times daily.    Marland Kitchen losartan (COZAAR) 100 MG tablet Take 100 mg by mouth daily.    . metoprolol tartrate (LOPRESSOR) 100 MG tablet Take 100 mg by mouth 2 (two) times daily.    . mirtazapine (REMERON) 7.5 MG tablet Take 7.5 mg by mouth at bedtime.    Marland Kitchen omeprazole (PRILOSEC) 40 MG capsule Take 40 mg by mouth daily.    . sodium bicarbonate 325 MG tablet Take 325 mg by mouth 2 (two) times daily.    Marland Kitchen torsemide (DEMADEX) 10 MG tablet Take 10 mg by mouth daily. May take 1 additional tablet as needed for swelling     No current facility-administered medications on file prior to visit.    There are no Patient Instructions on file for this visit. No follow-ups on file.  Mumin Denomme A Jalynn Betzold, PA-C

## 2018-11-23 ENCOUNTER — Telehealth: Payer: Self-pay | Admitting: *Deleted

## 2018-11-23 NOTE — Telephone Encounter (Signed)
Per- Desirae from [facility] called and stated that Per: Maddie Tedrow-PA stated that patient no longer needed to be seen.  The scheduled 11/28/18 MD appt was CAN. as requested

## 2018-11-23 NOTE — Telephone Encounter (Signed)
Patient will call back by 11/27/18 to let us know if she intends on keeping the 11/28/18 appt to see Dr.Finn

## 2018-11-28 ENCOUNTER — Encounter: Payer: Medicare Other | Admitting: Oncology

## 2018-11-29 ENCOUNTER — Ambulatory Visit (INDEPENDENT_AMBULATORY_CARE_PROVIDER_SITE_OTHER): Payer: Medicare Other | Admitting: Nurse Practitioner

## 2018-11-29 ENCOUNTER — Encounter (INDEPENDENT_AMBULATORY_CARE_PROVIDER_SITE_OTHER): Payer: Medicare Other

## 2018-11-29 ENCOUNTER — Other Ambulatory Visit (INDEPENDENT_AMBULATORY_CARE_PROVIDER_SITE_OTHER): Payer: Medicare Other

## 2018-12-13 ENCOUNTER — Other Ambulatory Visit (INDEPENDENT_AMBULATORY_CARE_PROVIDER_SITE_OTHER): Payer: Self-pay | Admitting: Vascular Surgery

## 2018-12-13 ENCOUNTER — Encounter (INDEPENDENT_AMBULATORY_CARE_PROVIDER_SITE_OTHER): Payer: Self-pay

## 2018-12-18 ENCOUNTER — Encounter
Admission: RE | Admit: 2018-12-18 | Discharge: 2018-12-18 | Disposition: A | Discharge status: Home / Self Care | Payer: Medicare Other | Source: Ambulatory Visit | Attending: Vascular Surgery | Admitting: Vascular Surgery

## 2018-12-18 ENCOUNTER — Other Ambulatory Visit: Payer: Self-pay

## 2018-12-18 DIAGNOSIS — Z79899 Other long term (current) drug therapy: Secondary | ICD-10-CM | POA: Diagnosis not present

## 2018-12-18 DIAGNOSIS — N186 End stage renal disease: Secondary | ICD-10-CM | POA: Diagnosis present

## 2018-12-18 DIAGNOSIS — D631 Anemia in chronic kidney disease: Secondary | ICD-10-CM | POA: Diagnosis not present

## 2018-12-18 DIAGNOSIS — I701 Atherosclerosis of renal artery: Secondary | ICD-10-CM | POA: Diagnosis not present

## 2018-12-18 DIAGNOSIS — Z01818 Encounter for other preprocedural examination: Secondary | ICD-10-CM | POA: Insufficient documentation

## 2018-12-18 DIAGNOSIS — I120 Hypertensive chronic kidney disease with stage 5 chronic kidney disease or end stage renal disease: Secondary | ICD-10-CM | POA: Diagnosis not present

## 2018-12-18 DIAGNOSIS — Z7982 Long term (current) use of aspirin: Secondary | ICD-10-CM | POA: Diagnosis not present

## 2018-12-18 DIAGNOSIS — K219 Gastro-esophageal reflux disease without esophagitis: Secondary | ICD-10-CM | POA: Diagnosis not present

## 2018-12-18 DIAGNOSIS — K2270 Barrett's esophagus without dysplasia: Secondary | ICD-10-CM | POA: Diagnosis not present

## 2018-12-18 DIAGNOSIS — E213 Hyperparathyroidism, unspecified: Secondary | ICD-10-CM | POA: Diagnosis not present

## 2018-12-18 DIAGNOSIS — E785 Hyperlipidemia, unspecified: Secondary | ICD-10-CM | POA: Diagnosis not present

## 2018-12-18 LAB — CBC WITH DIFFERENTIAL/PLATELET
Abs Immature Granulocytes: 0.02 10*3/uL (ref 0.00–0.07)
Basophils Absolute: 0 10*3/uL (ref 0.0–0.1)
Basophils Relative: 1 %
EOS ABS: 0.2 10*3/uL (ref 0.0–0.5)
Eosinophils Relative: 4 %
HCT: 26.7 % — ABNORMAL LOW (ref 36.0–46.0)
Hemoglobin: 8.2 g/dL — ABNORMAL LOW (ref 12.0–15.0)
Immature Granulocytes: 0 %
Lymphocytes Relative: 16 %
Lymphs Abs: 0.7 10*3/uL (ref 0.7–4.0)
MCH: 30.7 pg (ref 26.0–34.0)
MCHC: 30.7 g/dL (ref 30.0–36.0)
MCV: 100 fL (ref 80.0–100.0)
Monocytes Absolute: 0.6 10*3/uL (ref 0.1–1.0)
Monocytes Relative: 13 %
NEUTROS ABS: 3.1 10*3/uL (ref 1.7–7.7)
Neutrophils Relative %: 66 %
Platelets: 238 10*3/uL (ref 150–400)
RBC: 2.67 MIL/uL — ABNORMAL LOW (ref 3.87–5.11)
RDW: 16 % — AB (ref 11.5–15.5)
WBC: 4.6 10*3/uL (ref 4.0–10.5)
nRBC: 0 % (ref 0.0–0.2)

## 2018-12-18 LAB — BASIC METABOLIC PANEL
Anion gap: 10 (ref 5–15)
BUN: 35 mg/dL — ABNORMAL HIGH (ref 8–23)
CO2: 26 mmol/L (ref 22–32)
Calcium: 8.5 mg/dL — ABNORMAL LOW (ref 8.9–10.3)
Chloride: 96 mmol/L — ABNORMAL LOW (ref 98–111)
Creatinine, Ser: 3.29 mg/dL — ABNORMAL HIGH (ref 0.44–1.00)
GFR calc non Af Amer: 13 mL/min — ABNORMAL LOW (ref >60)
GFR, EST AFRICAN AMERICAN: 15 mL/min — AB (ref >60)
Glucose, Bld: 140 mg/dL — ABNORMAL HIGH (ref 70–99)
Potassium: 3 mmol/L — ABNORMAL LOW (ref 3.5–5.1)
Sodium: 132 mmol/L — ABNORMAL LOW (ref 135–145)

## 2018-12-18 LAB — TYPE AND SCREEN
ABO/RH(D): B POS
Antibody Screen: NEGATIVE

## 2018-12-18 LAB — PROTIME-INR
INR: 0.8
Prothrombin Time: 11.3 seconds — ABNORMAL LOW (ref 11.4–15.2)

## 2018-12-18 LAB — APTT: aPTT: 31 seconds (ref 24–36)

## 2018-12-18 NOTE — Patient Instructions (Signed)
  Your procedure is scheduled on: Thursday December 21, 2018 Report to Same Day Surgery 2nd floor [facility] Entrance-take elevator on left to 2nd floor.  Check in with surgery information desk.) To find out your arrival time, call [phone] 1:00-3:00 PM on Wednesday December 20, 2018  Remember: Instructions that are not followed completely may result in serious medical risk, up to and including death, or upon the discretion of your surgeon and anesthesiologist your surgery may need to be rescheduled.    __x__ 1. Do not eat food (including mints, candies, chewing gum) after midnight the night before your procedure. You may drink clear liquids up to 2 hours before you are scheduled to arrive at the hospital for your procedure.  Do not drink anything within 2 hours of your scheduled arrival to the hospital.  Approved clear liquids:  --Water or Apple juice without pulp  --Clear carbohydrate beverage such as Gatorade or Powerade  --Black Coffee or Clear Tea (No milk, no creamers, do not add anything to the coffee or tea)    __x__ 2. No Alcohol for 24 hours before or after surgery.   __x__ 3. No Smoking or e-cigarettes for 24 hours before surgery.  Do not use any chewable tobacco products for at least 6 hours before surgery.   __x__ 4. Notify your doctor if there is any change in your medical condition (cold, fever, infections).   __x__ 5. On the morning of surgery brush your teeth with toothpaste and water.  You may rinse your mouth with mouthwash if you wish.  Do not swallow any toothpaste or mouthwash.  Please read over the following fact sheets that you were given:   [facility] Preparing for Surgery and/or MRSA Information    __x__ Use Sage wipes as directed on instruction sheet   Do not wear jewelry, make-up, hairpins, clips or nail polish on the day of surgery.  Do not wear lotions, powders, deodorant, or perfumes.   Do not bring valuables to the hospital.    [facility] is not responsible for any belongings or valuables.               Contacts, dentures or bridgework may not be worn into surgery.  For patients discharged on the day of surgery, you will NOT be permitted to drive yourself home.  You must have a responsible adult with you for 24 hours after surgery.  __x__ Take these medicines on the morning of surgery with a SMALL SIP OF WATER:  1. Omeprazole (Prilosec)  2. Clonidine  3. Hydralazine  4. Metoprolol   5. Escitalopram   Do not take your Furosemide or Losartan on the morning of surgery  __x__ Follow recommendations from Cardiologist, Pulmonologist or PCP regarding stopping Aspirin, Coumadin, Plavix, Eliquis, Effient, Pradaxa, and Pletal.  __x__ Do not take any anti-inflammatories such as Advil, Ibuprofen, Motrin, Aleve, Naproxen, Naprosyn, BC/Goodies powders or aspirin products. You may continue to take Tylenol and Celebrex.   __x__ Do not take any over the counter supplements until after surgery.

## 2018-12-19 NOTE — Pre-Procedure Instructions (Signed)
Met B results sent to Dr. Dew and Anesthesia for review. 

## 2018-12-20 MED ORDER — CEFAZOLIN SODIUM-DEXTROSE 2-4 GM/100ML-% IV SOLN
2.0000 g | INTRAVENOUS | Status: AC
  Administered 2018-12-21: 2 g via INTRAVENOUS

## 2018-12-21 ENCOUNTER — Ambulatory Visit
Admission: RE | Admit: 2018-12-21 | Discharge: 2018-12-21 | Disposition: A | Discharge status: Home / Self Care | Payer: Medicare Other | Attending: Vascular Surgery | Admitting: Vascular Surgery

## 2018-12-21 ENCOUNTER — Encounter
Admission: RE | Disposition: A | Discharge status: Home / Self Care | Payer: Self-pay | Source: Home / Self Care | Attending: Vascular Surgery

## 2018-12-21 ENCOUNTER — Ambulatory Visit: Payer: Medicare Other

## 2018-12-21 ENCOUNTER — Other Ambulatory Visit: Payer: Self-pay

## 2018-12-21 DIAGNOSIS — K2270 Barrett's esophagus without dysplasia: Secondary | ICD-10-CM | POA: Insufficient documentation

## 2018-12-21 DIAGNOSIS — E213 Hyperparathyroidism, unspecified: Secondary | ICD-10-CM | POA: Insufficient documentation

## 2018-12-21 DIAGNOSIS — N186 End stage renal disease: Secondary | ICD-10-CM | POA: Diagnosis not present

## 2018-12-21 DIAGNOSIS — Z79899 Other long term (current) drug therapy: Secondary | ICD-10-CM | POA: Insufficient documentation

## 2018-12-21 DIAGNOSIS — E785 Hyperlipidemia, unspecified: Secondary | ICD-10-CM | POA: Insufficient documentation

## 2018-12-21 DIAGNOSIS — I701 Atherosclerosis of renal artery: Secondary | ICD-10-CM | POA: Insufficient documentation

## 2018-12-21 DIAGNOSIS — I120 Hypertensive chronic kidney disease with stage 5 chronic kidney disease or end stage renal disease: Secondary | ICD-10-CM | POA: Insufficient documentation

## 2018-12-21 DIAGNOSIS — Z7982 Long term (current) use of aspirin: Secondary | ICD-10-CM | POA: Insufficient documentation

## 2018-12-21 DIAGNOSIS — K219 Gastro-esophageal reflux disease without esophagitis: Secondary | ICD-10-CM | POA: Insufficient documentation

## 2018-12-21 DIAGNOSIS — D631 Anemia in chronic kidney disease: Secondary | ICD-10-CM | POA: Insufficient documentation

## 2018-12-21 HISTORY — PX: AV FISTULA PLACEMENT: SHX1204

## 2018-12-21 LAB — POCT I-STAT 4, (NA,K, GLUC, HGB,HCT)
Glucose, Bld: 105 mg/dL — ABNORMAL HIGH (ref 70–99)
HCT: 28 % — ABNORMAL LOW (ref 36.0–46.0)
Hemoglobin: 9.5 g/dL — ABNORMAL LOW (ref 12.0–15.0)
Potassium: 3.1 mmol/L — ABNORMAL LOW (ref 3.5–5.1)
Sodium: 133 mmol/L — ABNORMAL LOW (ref 135–145)

## 2018-12-21 LAB — ABO/RH: ABO/RH(D): B POS

## 2018-12-21 SURGERY — ARTERIOVENOUS (AV) FISTULA CREATION
Anesthesia: General | Laterality: Left

## 2018-12-21 MED ORDER — SUCCINYLCHOLINE CHLORIDE 20 MG/ML IJ SOLN
INTRAMUSCULAR | Status: AC
  Filled 2018-12-21: qty 1

## 2018-12-21 MED ORDER — HYDROCODONE-ACETAMINOPHEN 5-325 MG PO TABS: 1.0000 | ORAL_TABLET | Freq: Four times a day (QID) | ORAL | 0 refills | Status: DC | PRN

## 2018-12-21 MED ORDER — CEFAZOLIN SODIUM-DEXTROSE 2-4 GM/100ML-% IV SOLN
INTRAVENOUS | Status: AC
  Filled 2018-12-21: qty 100

## 2018-12-21 MED ORDER — LIDOCAINE HCL (PF) 2 % IJ SOLN
INTRAMUSCULAR | Status: AC
  Filled 2018-12-21: qty 10

## 2018-12-21 MED ORDER — FENTANYL CITRATE (PF) 100 MCG/2ML IJ SOLN
25.0000 ug | INTRAMUSCULAR | Status: DC | PRN
  Administered 2018-12-21: 25 ug via INTRAVENOUS

## 2018-12-21 MED ORDER — HYDROCODONE-ACETAMINOPHEN 5-325 MG PO TABS
1.0000 | ORAL_TABLET | Freq: Once | ORAL | Status: AC
  Administered 2018-12-21: 1 via ORAL

## 2018-12-21 MED ORDER — MIDAZOLAM HCL 2 MG/2ML IJ SOLN
INTRAMUSCULAR | Status: AC
  Filled 2018-12-21: qty 2

## 2018-12-21 MED ORDER — FENTANYL CITRATE (PF) 100 MCG/2ML IJ SOLN
INTRAMUSCULAR | Status: AC
  Administered 2018-12-21: 25 ug via INTRAVENOUS
  Filled 2018-12-21: qty 2

## 2018-12-21 MED ORDER — HEPARIN SODIUM (PORCINE) 5000 UNIT/ML IJ SOLN
INTRAMUSCULAR | Status: AC
  Filled 2018-12-21: qty 1

## 2018-12-21 MED ORDER — FENTANYL CITRATE (PF) 100 MCG/2ML IJ SOLN
INTRAMUSCULAR | Status: DC | PRN
  Administered 2018-12-21 (×2): 50 ug via INTRAVENOUS

## 2018-12-21 MED ORDER — HYDROCODONE-ACETAMINOPHEN 5-325 MG PO TABS
ORAL_TABLET | ORAL | Status: AC
  Filled 2018-12-21: qty 1

## 2018-12-21 MED ORDER — SODIUM CHLORIDE 0.9 % IV SOLN
INTRAVENOUS | Status: DC
  Administered 2018-12-21: 12:00:00 via INTRAVENOUS

## 2018-12-21 MED ORDER — LACTATED RINGERS IV SOLN: INTRAVENOUS | Status: DC | PRN

## 2018-12-21 MED ORDER — PHENYLEPHRINE HCL 10 MG/ML IJ SOLN
INTRAMUSCULAR | Status: DC | PRN
  Administered 2018-12-21 (×2): 100 ug via INTRAVENOUS

## 2018-12-21 MED ORDER — MIDAZOLAM HCL 2 MG/2ML IJ SOLN
INTRAMUSCULAR | Status: DC | PRN
  Administered 2018-12-21: 1 mg via INTRAVENOUS

## 2018-12-21 MED ORDER — ROCURONIUM BROMIDE 100 MG/10ML IV SOLN
INTRAVENOUS | Status: DC | PRN
  Administered 2018-12-21: 10 mg via INTRAVENOUS

## 2018-12-21 MED ORDER — CHLORHEXIDINE GLUCONATE CLOTH 2 % EX PADS: 6.0000 | MEDICATED_PAD | Freq: Once | CUTANEOUS | Status: DC

## 2018-12-21 MED ORDER — PROPOFOL 10 MG/ML IV BOLUS
INTRAVENOUS | Status: DC | PRN
  Administered 2018-12-21: 100 mg via INTRAVENOUS

## 2018-12-21 MED ORDER — ONDANSETRON HCL 4 MG/2ML IJ SOLN: 4.0000 mg | Freq: Once | INTRAMUSCULAR | Status: DC | PRN

## 2018-12-21 MED ORDER — DEXAMETHASONE SODIUM PHOSPHATE 10 MG/ML IJ SOLN
INTRAMUSCULAR | Status: DC | PRN
  Administered 2018-12-21: 5 mg via INTRAVENOUS

## 2018-12-21 MED ORDER — DEXAMETHASONE SODIUM PHOSPHATE 10 MG/ML IJ SOLN
INTRAMUSCULAR | Status: AC
  Filled 2018-12-21: qty 1

## 2018-12-21 MED ORDER — SUCCINYLCHOLINE CHLORIDE 20 MG/ML IJ SOLN
INTRAMUSCULAR | Status: DC | PRN
  Administered 2018-12-21: 80 mg via INTRAVENOUS

## 2018-12-21 MED ORDER — HEPARIN SODIUM (PORCINE) 1000 UNIT/ML IJ SOLN
INTRAMUSCULAR | Status: AC
  Filled 2018-12-21: qty 1

## 2018-12-21 MED ORDER — ONDANSETRON HCL 4 MG/2ML IJ SOLN
INTRAMUSCULAR | Status: DC | PRN
  Administered 2018-12-21: 4 mg via INTRAVENOUS

## 2018-12-21 MED ORDER — LIDOCAINE HCL (CARDIAC) PF 100 MG/5ML IV SOSY
PREFILLED_SYRINGE | INTRAVENOUS | Status: DC | PRN
  Administered 2018-12-21: 60 mg via INTRAVENOUS

## 2018-12-21 MED ORDER — SUGAMMADEX SODIUM 200 MG/2ML IV SOLN
INTRAVENOUS | Status: DC | PRN
  Administered 2018-12-21: 100 mg via INTRAVENOUS

## 2018-12-21 MED ORDER — PROPOFOL 10 MG/ML IV BOLUS
INTRAVENOUS | Status: AC
  Filled 2018-12-21: qty 20

## 2018-12-21 MED ORDER — BUPIVACAINE HCL (PF) 0.5 % IJ SOLN
INTRAMUSCULAR | Status: AC
  Filled 2018-12-21: qty 30

## 2018-12-21 MED ORDER — HEPARIN SODIUM (PORCINE) 1000 UNIT/ML IJ SOLN
INTRAMUSCULAR | Status: DC | PRN
  Administered 2018-12-21: 3000 [IU] via INTRAVENOUS

## 2018-12-21 MED ORDER — FENTANYL CITRATE (PF) 100 MCG/2ML IJ SOLN
INTRAMUSCULAR | Status: AC
  Filled 2018-12-21: qty 2

## 2018-12-21 MED ORDER — SODIUM CHLORIDE 0.9 % IV SOLN
INTRAVENOUS | Status: DC | PRN
  Administered 2018-12-21: 40 ug/min via INTRAVENOUS

## 2018-12-21 MED ORDER — ONDANSETRON HCL 4 MG/2ML IJ SOLN
INTRAMUSCULAR | Status: AC
  Filled 2018-12-21: qty 2

## 2018-12-21 SURGICAL SUPPLY — 55 items
BAG DECANTER FOR FLEXI CONT (MISCELLANEOUS) ×4 IMPLANT
BLADE SURG SZ11 CARB STEEL (BLADE) ×4 IMPLANT
BOOT SUTURE AID YELLOW STND (SUTURE) ×4 IMPLANT
BRUSH SCRUB EZ  4% CHG (MISCELLANEOUS) ×2
BRUSH SCRUB EZ 4% CHG (MISCELLANEOUS) ×2 IMPLANT
CANISTER SUCT 1200ML W/VALVE (MISCELLANEOUS) ×4 IMPLANT
CHLORAPREP W/TINT 26ML (MISCELLANEOUS) ×4 IMPLANT
CLIP SPRNG 6MM S-JAW DBL (CLIP) ×4
COVER WAND RF STERILE (DRAPES) ×4 IMPLANT
DECANTER SPIKE VIAL GLASS SM (MISCELLANEOUS) ×4 IMPLANT
DERMABOND ADVANCED (GAUZE/BANDAGES/DRESSINGS) ×2
DERMABOND ADVANCED .7 DNX12 (GAUZE/BANDAGES/DRESSINGS) ×2 IMPLANT
ELECT CAUTERY BLADE 6.4 (BLADE) ×4 IMPLANT
ELECT REM PT RETURN 9FT ADLT (ELECTROSURGICAL) ×4
ELECTRODE REM PT RTRN 9FT ADLT (ELECTROSURGICAL) ×2 IMPLANT
GEL ULTRASOUND 20GR AQUASONIC (MISCELLANEOUS) IMPLANT
GLOVE BIO SURGEON STRL SZ7 (GLOVE) ×8 IMPLANT
GLOVE INDICATOR 7.5 STRL GRN (GLOVE) ×4 IMPLANT
GOWN STRL REUS W/ TWL LRG LVL3 (GOWN DISPOSABLE) ×2 IMPLANT
GOWN STRL REUS W/ TWL XL LVL3 (GOWN DISPOSABLE) ×4 IMPLANT
GOWN STRL REUS W/TWL LRG LVL3 (GOWN DISPOSABLE) ×2
GOWN STRL REUS W/TWL XL LVL3 (GOWN DISPOSABLE) ×4
HEMOSTAT SURGICEL 2X3 (HEMOSTASIS) ×4 IMPLANT
IV NS 500ML (IV SOLUTION) ×2
IV NS 500ML BAXH (IV SOLUTION) ×2 IMPLANT
KIT TURNOVER KIT A (KITS) ×4 IMPLANT
LABEL OR SOLS (LABEL) ×4 IMPLANT
LOOP RED MAXI  1X406MM (MISCELLANEOUS) ×2
LOOP VESSEL MAXI 1X406 RED (MISCELLANEOUS) ×2 IMPLANT
LOOP VESSEL MINI 0.8X406 BLUE (MISCELLANEOUS) ×2 IMPLANT
LOOPS BLUE MINI 0.8X406MM (MISCELLANEOUS) ×2
NEEDLE FILTER BLUNT 18X 1/2SAF (NEEDLE) ×2
NEEDLE FILTER BLUNT 18X1 1/2 (NEEDLE) ×2 IMPLANT
NEEDLE HYPO 30X.5 LL (NEEDLE) IMPLANT
NS IRRIG 500ML POUR BTL (IV SOLUTION) ×4 IMPLANT
PACK EXTREMITY ARMC (MISCELLANEOUS) ×4 IMPLANT
PAD PREP 24X41 OB/GYN DISP (PERSONAL CARE ITEMS) ×4 IMPLANT
SOLUTION CELL SAVER (CLIP) ×2 IMPLANT
STOCKINETTE STRL 4IN 9604848 (GAUZE/BANDAGES/DRESSINGS) ×4 IMPLANT
SUT GORETEX CV-6TTC-13 36IN (SUTURE) ×8 IMPLANT
SUT MNCRL AB 4-0 PS2 18 (SUTURE) ×4 IMPLANT
SUT PROLENE 6 0 BV (SUTURE) ×16 IMPLANT
SUT SILK 0 SH 30 (SUTURE) ×4 IMPLANT
SUT SILK 2 0 (SUTURE) ×2
SUT SILK 2-0 18XBRD TIE 12 (SUTURE) ×2 IMPLANT
SUT SILK 3 0 (SUTURE) ×2
SUT SILK 3-0 18XBRD TIE 12 (SUTURE) ×2 IMPLANT
SUT SILK 4 0 (SUTURE) ×2
SUT SILK 4-0 18XBRD TIE 12 (SUTURE) ×2 IMPLANT
SUT VIC AB 3-0 SH 27 (SUTURE) ×2
SUT VIC AB 3-0 SH 27X BRD (SUTURE) ×2 IMPLANT
SYR 20CC LL (SYRINGE) ×4 IMPLANT
SYR 3ML LL SCALE MARK (SYRINGE) ×4 IMPLANT
SYR TB 1ML 27GX1/2 LL (SYRINGE) IMPLANT
TOWEL OR 17X26 4PK STRL BLUE (TOWEL DISPOSABLE) ×4 IMPLANT

## 2018-12-21 NOTE — H&P (Signed)
[facility] History & Physical Update  The patient was interviewed and re-examined.  The patient's previous History and Physical has been reviewed and is unchanged.  There is no change in the plan of care. We plan to proceed with the scheduled procedure. Heart: RRR, No JVD Lungs CTAB  Leotis Pain, MD  12/21/2018, 12:35 PM

## 2018-12-21 NOTE — Discharge Instructions (Signed)

## 2018-12-21 NOTE — Anesthesia Post-op Follow-up Note (Signed)
Anesthesia QCDR form completed.        

## 2018-12-21 NOTE — Op Note (Signed)
[facility]   OPERATIVE NOTE   PROCEDURE: Left brachiocephalic arteriovenous fistula placement  PRE-OPERATIVE DIAGNOSIS: 1.  ESRD        POST-OPERATIVE DIAGNOSIS: 1. ESRD     SURGEON: Leotis Pain, MD  ASSISTANT(S): Hezzie Bump, PA-C  ANESTHESIA: general  ESTIMATED BLOOD LOSS: 10 cc  FINDING(S): Adequate cephalic vein for fistula creation  SPECIMEN(S):  none  INDICATIONS:   Anita Wells is a 78 y.o. female who presents with renal failure in need of pemanent dialysis acces.  The patient is scheduled for left arm access placement.  The patient is aware the risks include but are not limited to: bleeding, infection, steal syndrome, nerve damage, ischemic monomelic neuropathy, failure to mature, and need for additional procedures.  Her preoperative vein mapping had suggested that her cephalic vein and basilic veins would not be usable for fistula creation, but it was discussed with the patient and family that if adequate cephalic vein was seen at the time of surgery fistula creation would be planned rather than an AV graft.  The patient is aware of the risks of the procedure and elects to proceed forward. An assistant was present during the procedure to help facilitate the exposure and expedite the procedure.  DESCRIPTION: After full informed written consent was obtained from the patient, the patient was brought back to the operating room and placed supine upon the operating table.  Prior to induction, the patient received IV antibiotics.   After obtaining adequate anesthesia, the patient was then prepped and draped in the standard fashion for a left arm access procedure. The assistant provided retraction and mobilization to help facilitate exposure and expedite the procedure throughout the entire procedure.  This included following suture, using retractors, and optimizing lighting. I made a curvilinear incision at the level of the antecubital fossa and dissected  through the subcutaneous tissue and fascia to gain exposure of the brachial artery.  This was noted to be patent and adequate in size for fistula creation.  This was dissected out proximally and distally and prepared for control with vessel loops .  I then dissected out the cephalic vein.  This was noted to be patent and adequate in size for fistula creation.  This was a pleasant surprise and looked better than her vein mapping had suggested.  I then gave the patient 3000 units of intravenous heparin.  The vein was marked for orientation and the distal segment of the vein was ligated with a  2-0 silk, and the vein was transected.  Given her preoperative vein mapping, I felt it was best to interrogate the vein with dilators.  After 2 venous branches were ligated and divided between silk ties, I interrogated the cephalic vein with a series of dilators.  It easily accommodated a 2, 2.5, and 3 mm dilator without significant resistance.  I then instilled the heparinized saline into the vein and clamped it.  At this point, I reset my exposure of the brachial artery and pulled up control on the vessel loops.  I made an arteriotomy with a #11 blade, and then I extended the arteriotomy with a Potts scissor.  I injected heparinized saline proximal and distal to this arteriotomy.  The vein was then sewn to the artery in an end-to-side configuration with a running stitch of 6-0 Prolene.  Prior to completing this anastomosis, I allowed the vein and artery to backbleed.  There was no evidence of clot from any vessels.  I completed the anastomosis  in the usual fashion and then released all vessel loops and clamps.  There was a palpable  thrill in the venous outflow, and there was a palpable pulse in the artery distal to the anastomosis.  At this point, I irrigated out the surgical wound.  Surgicel was placed. There was no further active bleeding.  The subcutaneous tissue was reapproximated with a running stitch of 3-0 Vicryl.  The  skin was then closed with a 4-0 Monocryl suture.  The skin was then cleaned, dried, and reinforced with Dermabond.  The patient tolerated this procedure well and was taken to the recovery room in stable condition  COMPLICATIONS: None  CONDITION: Stable   Leotis Pain    12/21/2018, 2:47 PM  This note was created with Dragon Medical transcription system. Any errors in dictation are purely unintentional.

## 2018-12-21 NOTE — Transfer of Care (Signed)
Immediate Anesthesia Transfer of Care Note  Patient: Anita Wells  Procedure(s) Performed: INSERTION OF ARTERIOVENOUS (AV) GORE-TEX GRAFT ARM ( BRACHIAL AXILLARY ) (Left )  Patient Location: PACU  Anesthesia Type:General  Level of Consciousness: awake and drowsy  Airway & Oxygen Therapy: Patient Spontanous Breathing and Patient connected to face mask oxygen  Post-op Assessment: Report given to RN and Post -op Vital signs reviewed and stable  Post vital signs: Reviewed and stable  Last Vitals:  Vitals Value Taken Time  BP 141/51 12/21/2018  2:54 PM  Temp 36.7 C 12/21/2018  2:54 PM  Pulse 67 12/21/2018  2:56 PM  Resp 14 12/21/2018  2:56 PM  SpO2 100 % 12/21/2018  2:56 PM  Vitals shown include unvalidated device data.  Last Pain:  Vitals:   12/21/18 1454  TempSrc:   PainSc: Asleep         Complications: No apparent anesthesia complications

## 2018-12-21 NOTE — Anesthesia Procedure Notes (Signed)
Procedure Name: Intubation Date/Time: 12/21/2018 1:21 PM Performed by: Launa Grill, RN Pre-anesthesia Checklist: Patient identified, Emergency Drugs available, Suction available, Patient being monitored and Timeout performed Patient Re-evaluated:Patient Re-evaluated prior to induction Oxygen Delivery Method: Circle system utilized Preoxygenation: Pre-oxygenation with 100% oxygen Induction Type: IV induction Ventilation: Mask ventilation without difficulty Laryngoscope Size: Miller and 2 Grade View: Grade I Tube type: Oral Tube size: 7.0 mm Number of attempts: 1 Airway Equipment and Method: Stylet Placement Confirmation: ETT inserted through vocal cords under direct vision,  positive ETCO2,  CO2 detector and breath sounds checked- equal and bilateral Secured at: 21 cm Tube secured with: Tape Dental Injury: Teeth and Oropharynx as per pre-operative assessment

## 2018-12-21 NOTE — Anesthesia Preprocedure Evaluation (Signed)
Anesthesia Evaluation  Patient identified by MRN, date of birth, ID band Patient awake    Reviewed: Allergy & Precautions, NPO status , Patient's Chart, lab work & pertinent test results  Airway Mallampati: III       Dental   Pulmonary Current Smoker,    Pulmonary exam normal        Cardiovascular hypertension, + Peripheral Vascular Disease  Normal cardiovascular exam     Neuro/Psych    GI/Hepatic Neg liver ROS, GERD  ,barretts esophagus   Endo/Other  negative endocrine ROS  Renal/GU ESRFRenal disease  negative genitourinary   Musculoskeletal   Abdominal Normal abdominal exam  (+)   Peds  Hematology  (+) anemia ,   Anesthesia Other Findings Past Medical History: No date: Anemia No date: Barrett's esophagus No date: Carotid stenosis, bilateral No date: Chronic kidney disease No date: GERD (gastroesophageal reflux disease) No date: Hyperlipidemia No date: Hyperparathyroidism ([place]) No date: Hypertension No date: Renal artery stenosis ([facility]CC)     Comment:  left  Reproductive/Obstetrics                             Anesthesia Physical Anesthesia Plan  ASA: III  Anesthesia Plan: General   Post-op Pain Management:    Induction: Intravenous, Rapid sequence and Cricoid pressure planned  PONV Risk Score and Plan:   Airway Management Planned: Oral ETT  Additional Equipment:   Intra-op Plan:   Post-operative Plan: Extubation in OR  Informed Consent: I have reviewed the patients History and Physical, chart, labs and discussed the procedure including the risks, benefits and alternatives for the proposed anesthesia with the patient or authorized representative who has indicated his/her understanding and acceptance.     Dental advisory given  Plan Discussed with: CRNA and Surgeon  Anesthesia Plan Comments:         Anesthesia Quick Evaluation

## 2018-12-22 ENCOUNTER — Encounter: Payer: Self-pay | Admitting: Vascular Surgery

## 2018-12-25 NOTE — Anesthesia Postprocedure Evaluation (Signed)
Anesthesia Post Note  Patient: Anita Wells  Procedure(s) Performed: ARTERIOVENOUS (AV) FISTULA CREATION  Patient location during evaluation: PACU Anesthesia Type: General Level of consciousness: awake and alert and oriented Pain management: pain level controlled Vital Signs Assessment: post-procedure vital signs reviewed and stable Respiratory status: spontaneous breathing Cardiovascular status: blood pressure returned to baseline Anesthetic complications: no     Last Vitals:  Vitals:   12/21/18 1539 12/21/18 1556  BP: (!) 105/46 (!) 127/40  Pulse: 66 64  Resp: 12 16  Temp: 36.7 C (!) 36.4 C  SpO2: 97% 100%    Last Pain:  Vitals:   12/22/18 0844  TempSrc:   PainSc: 6                  Marlo Arriola

## 2019-01-29 ENCOUNTER — Other Ambulatory Visit (INDEPENDENT_AMBULATORY_CARE_PROVIDER_SITE_OTHER): Payer: Self-pay | Admitting: Vascular Surgery

## 2019-01-29 DIAGNOSIS — N186 End stage renal disease: Secondary | ICD-10-CM

## 2019-02-01 ENCOUNTER — Other Ambulatory Visit: Payer: Self-pay

## 2019-02-01 ENCOUNTER — Ambulatory Visit (INDEPENDENT_AMBULATORY_CARE_PROVIDER_SITE_OTHER): Payer: Medicare Other

## 2019-02-01 ENCOUNTER — Ambulatory Visit (INDEPENDENT_AMBULATORY_CARE_PROVIDER_SITE_OTHER): Payer: Medicare Other | Admitting: Nurse Practitioner

## 2019-02-01 DIAGNOSIS — N186 End stage renal disease: Secondary | ICD-10-CM

## 2019-02-01 DIAGNOSIS — Z992 Dependence on renal dialysis: Secondary | ICD-10-CM

## 2019-02-15 ENCOUNTER — Encounter (INDEPENDENT_AMBULATORY_CARE_PROVIDER_SITE_OTHER): Payer: Self-pay

## 2019-02-15 ENCOUNTER — Other Ambulatory Visit (INDEPENDENT_AMBULATORY_CARE_PROVIDER_SITE_OTHER): Payer: Self-pay | Admitting: Nurse Practitioner

## 2019-02-16 ENCOUNTER — Other Ambulatory Visit (INDEPENDENT_AMBULATORY_CARE_PROVIDER_SITE_OTHER): Payer: Self-pay | Admitting: Nurse Practitioner

## 2019-02-18 MED ORDER — CEFAZOLIN SODIUM-DEXTROSE 1-4 GM/50ML-% IV SOLN
1.0000 g | Freq: Once | INTRAVENOUS | Status: AC
  Administered 2019-02-19: 1 g via INTRAVENOUS

## 2019-02-19 ENCOUNTER — Other Ambulatory Visit: Payer: Self-pay

## 2019-02-19 ENCOUNTER — Ambulatory Visit
Admission: RE | Admit: 2019-02-19 | Discharge: 2019-02-19 | Disposition: A | Discharge status: Home / Self Care | Payer: Medicare Other | Attending: Vascular Surgery | Admitting: Vascular Surgery

## 2019-02-19 ENCOUNTER — Encounter
Admission: RE | Disposition: A | Discharge status: Home / Self Care | Payer: Self-pay | Source: Home / Self Care | Attending: Vascular Surgery

## 2019-02-19 DIAGNOSIS — E785 Hyperlipidemia, unspecified: Secondary | ICD-10-CM | POA: Diagnosis not present

## 2019-02-19 DIAGNOSIS — I6523 Occlusion and stenosis of bilateral carotid arteries: Secondary | ICD-10-CM | POA: Diagnosis not present

## 2019-02-19 DIAGNOSIS — Z888 Allergy status to other drugs, medicaments and biological substances status: Secondary | ICD-10-CM | POA: Diagnosis not present

## 2019-02-19 DIAGNOSIS — Z960 Presence of urogenital implants: Secondary | ICD-10-CM | POA: Insufficient documentation

## 2019-02-19 DIAGNOSIS — I701 Atherosclerosis of renal artery: Secondary | ICD-10-CM | POA: Insufficient documentation

## 2019-02-19 DIAGNOSIS — K219 Gastro-esophageal reflux disease without esophagitis: Secondary | ICD-10-CM | POA: Insufficient documentation

## 2019-02-19 DIAGNOSIS — Y832 Surgical operation with anastomosis, bypass or graft as the cause of abnormal reaction of the patient, or of later complication, without mention of misadventure at the time of the procedure: Secondary | ICD-10-CM | POA: Diagnosis not present

## 2019-02-19 DIAGNOSIS — Z992 Dependence on renal dialysis: Secondary | ICD-10-CM | POA: Diagnosis not present

## 2019-02-19 DIAGNOSIS — F17210 Nicotine dependence, cigarettes, uncomplicated: Secondary | ICD-10-CM | POA: Diagnosis not present

## 2019-02-19 DIAGNOSIS — E213 Hyperparathyroidism, unspecified: Secondary | ICD-10-CM | POA: Insufficient documentation

## 2019-02-19 DIAGNOSIS — T82858A Stenosis of vascular prosthetic devices, implants and grafts, initial encounter: Secondary | ICD-10-CM | POA: Insufficient documentation

## 2019-02-19 DIAGNOSIS — K2270 Barrett's esophagus without dysplasia: Secondary | ICD-10-CM | POA: Insufficient documentation

## 2019-02-19 DIAGNOSIS — I120 Hypertensive chronic kidney disease with stage 5 chronic kidney disease or end stage renal disease: Secondary | ICD-10-CM | POA: Diagnosis not present

## 2019-02-19 DIAGNOSIS — N186 End stage renal disease: Secondary | ICD-10-CM | POA: Insufficient documentation

## 2019-02-19 HISTORY — PX: A/V FISTULAGRAM: CATH118298

## 2019-02-19 LAB — POTASSIUM (ARMC VASCULAR LAB ONLY): Potassium (ARMC vascular lab): 4.3 (ref 3.5–5.1)

## 2019-02-19 SURGERY — A/V FISTULAGRAM
Anesthesia: Moderate Sedation | Laterality: Left

## 2019-02-19 MED ORDER — SODIUM CHLORIDE 0.9 % IV SOLN: INTRAVENOUS | Status: DC

## 2019-02-19 MED ORDER — IOHEXOL 300 MG/ML  SOLN
INTRAMUSCULAR | Status: DC | PRN
  Administered 2019-02-19: 10:00:00 30 mL via INTRAVENOUS

## 2019-02-19 MED ORDER — FAMOTIDINE 20 MG PO TABS: 40.0000 mg | ORAL_TABLET | Freq: Once | ORAL | Status: DC | PRN

## 2019-02-19 MED ORDER — METHYLPREDNISOLONE SODIUM SUCC 125 MG IJ SOLR: 125.0000 mg | Freq: Once | INTRAMUSCULAR | Status: DC | PRN

## 2019-02-19 MED ORDER — FENTANYL CITRATE (PF) 100 MCG/2ML IJ SOLN
INTRAMUSCULAR | Status: DC | PRN
  Administered 2019-02-19: 50 ug via INTRAVENOUS
  Administered 2019-02-19: 25 ug via INTRAVENOUS

## 2019-02-19 MED ORDER — LIDOCAINE-EPINEPHRINE (PF) 1 %-1:200000 IJ SOLN
INTRAMUSCULAR | Status: AC
  Filled 2019-02-19: qty 30

## 2019-02-19 MED ORDER — MIDAZOLAM HCL 2 MG/ML PO SYRP: 8.0000 mg | ORAL_SOLUTION | Freq: Once | ORAL | Status: DC | PRN

## 2019-02-19 MED ORDER — HEPARIN SODIUM (PORCINE) 1000 UNIT/ML IJ SOLN
INTRAMUSCULAR | Status: AC
  Filled 2019-02-19: qty 1

## 2019-02-19 MED ORDER — MIDAZOLAM HCL 2 MG/2ML IJ SOLN
INTRAMUSCULAR | Status: DC | PRN
  Administered 2019-02-19: 1 mg via INTRAVENOUS

## 2019-02-19 MED ORDER — HEPARIN SODIUM (PORCINE) 1000 UNIT/ML IJ SOLN
INTRAMUSCULAR | Status: DC | PRN
  Administered 2019-02-19: 3000 [IU] via INTRAVENOUS

## 2019-02-19 MED ORDER — DIPHENHYDRAMINE HCL 50 MG/ML IJ SOLN: 50.0000 mg | Freq: Once | INTRAMUSCULAR | Status: DC | PRN

## 2019-02-19 MED ORDER — MIDAZOLAM HCL 5 MG/5ML IJ SOLN
INTRAMUSCULAR | Status: AC
  Filled 2019-02-19: qty 5

## 2019-02-19 MED ORDER — FENTANYL CITRATE (PF) 100 MCG/2ML IJ SOLN
INTRAMUSCULAR | Status: AC
  Filled 2019-02-19: qty 2

## 2019-02-19 MED ORDER — ONDANSETRON HCL 4 MG/2ML IJ SOLN: 4.0000 mg | Freq: Four times a day (QID) | INTRAMUSCULAR | Status: DC | PRN

## 2019-02-19 MED ORDER — HYDROMORPHONE HCL 1 MG/ML IJ SOLN: 1.0000 mg | Freq: Once | INTRAMUSCULAR | Status: DC | PRN

## 2019-02-19 SURGICAL SUPPLY — 11 items
BALLN DORADO 5X40X80 (BALLOONS) ×3
BALLN LUTONIX DCB 6X60X130 (BALLOONS) ×3
BALLOON DORADO 5X40X80 (BALLOONS) ×1 IMPLANT
BALLOON LUTONIX DCB 6X60X130 (BALLOONS) ×1 IMPLANT
CANNULA 5F STIFF (CANNULA) ×3 IMPLANT
DEVICE PRESTO INFLATION (MISCELLANEOUS) ×3 IMPLANT
DRAPE BRACHIAL (DRAPES) ×3 IMPLANT
PACK ANGIOGRAPHY (CUSTOM PROCEDURE TRAY) ×3 IMPLANT
SHEATH BRITE TIP 6FRX5.5 (SHEATH) ×3 IMPLANT
SUT MNCRL AB 4-0 PS2 18 (SUTURE) ×3 IMPLANT
WIRE MAGIC TOR.035 180C (WIRE) ×3 IMPLANT

## 2019-02-19 NOTE — Discharge Instructions (Signed)
Dialysis Fistulogram, Care After °This sheet gives you information about how to care for yourself after your procedure. Your health care provider may also give you more specific instructions. If you have problems or questions, contact your health care provider. °What can I expect after the procedure? °After the procedure, it is common to have: °· A small amount of discomfort in the area where the small, thin tube (catheter) was placed for the procedure. °· A small amount of bruising around the fistula. °· Sleepiness and tiredness (fatigue). °Follow these instructions at home: °Activity ° °· Rest at home and do not lift anything that is heavier than 5 lb (2.3 kg) on the day after your procedure. °· Return to your normal activities as told by your health care provider. Ask your health care provider what activities are safe for you. °· Do not drive or use heavy machinery while taking prescription pain medicine. °· Do not drive for 24 hours if you were given a medicine to help you relax (sedative) during your procedure. °Medicines ° °· Take over-the-counter and prescription medicines only as told by your health care provider. °Puncture site care °· Follow instructions from your health care provider about how to take care of the site where catheters were inserted. Make sure you: °? Wash your hands with soap and water before you change your bandage (dressing). If soap and water are not available, use hand sanitizer. °? Change your dressing as told by your health care provider. °? Leave stitches (sutures), skin glue, or adhesive strips in place. These skin closures may need to stay in place for 2 weeks or longer. If adhesive strip edges start to loosen and curl up, you may trim the loose edges. Do not remove adhesive strips completely unless your health care provider tells you to do that. °· Check your puncture area every day for signs of infection. Check for: °? Redness, swelling, or pain. °? Fluid or  blood. °? Warmth. °? Pus or a bad smell. °General instructions °· Do not take baths, swim, or use a hot tub until your health care provider approves. Ask your health care provider if you may take showers. You may only be allowed to take sponge baths. °· Monitor your dialysis fistula closely. Check to make sure that you can feel a vibration or buzz (a thrill) when you put your fingers over the fistula. °· Prevent damage to your graft or fistula: °? Do not wear tight-fitting clothing or jewelry on the arm or leg that has your graft or fistula. °? Tell all your health care providers that you have a dialysis fistula or graft. °? Do not allow blood draws, IVs, or blood pressure readings to be done in the arm that has your fistula or graft. °? Do not allow flu shots or vaccinations in the arm with your fistula or graft. °· Keep all follow-up visits as told by your health care provider. This is important. °Contact a health care provider if: °· You have redness, swelling, or pain at the site where the catheter was put in. °· You have fluid or blood coming from the catheter site. °· The catheter site feels warm to the touch. °· You have pus or a bad smell coming from the catheter site. °· You have a fever or chills. °Get help right away if: °· You feel weak. °· You have trouble balancing. °· You have trouble moving your arms or legs. °· You have problems with your speech or vision. °· You   can no longer feel a vibration or buzz when you put your fingers over your dialysis fistula.  The limb that was used for the procedure: ? Swells. ? Is painful. ? Is cold. ? Is discolored, such as blue or pale white.  You have chest pain or shortness of breath. Summary  After a dialysis fistulogram, it is common to have a small amount of discomfort or bruising in the area where the small, thin tube (catheter) was placed.  Rest at home on the day after your procedure. Return to your normal activities as told by your health care  provider.  Take over-the-counter and prescription medicines only as told by your health care provider.  Follow instructions from your health care provider about how to take care of the site where the catheter was inserted.  Keep all follow-up visits as told by your health care provider. This information is not intended to replace advice given to you by your health care provider. Make sure you discuss any questions you have with your health care provider. Document Released: 02/25/2014 Document Revised: 11/11/2017 Document Reviewed: 11/11/2017 Elsevier Interactive Patient Education  2019 [place].  Moderate Conscious Sedation, Adult, Care After These instructions provide you with information about caring for yourself after your procedure. Your health care provider may also give you more specific instructions. Your treatment has been planned according to current medical practices, but problems sometimes occur. Call your health care provider if you have any problems or questions after your procedure. What can I expect after the procedure? After your procedure, it is common:  To feel sleepy for several hours.  To feel clumsy and have poor balance for several hours.  To have poor judgment for several hours.  To vomit if you eat too soon. Follow these instructions at home: For at least 24 hours after the procedure:   Do not: ? Participate in activities where you could fall or become injured. ? Drive. ? Use heavy machinery. ? Drink alcohol. ? Take sleeping pills or medicines that cause drowsiness. ? Make important decisions or sign legal documents. ? Take care of children on your own.  Rest. Eating and drinking  Follow the diet recommended by your health care provider.  If you vomit: ? Drink water, juice, or soup when you can drink without vomiting. ? Make sure you have little or no nausea before eating solid foods. General instructions  Have a responsible adult stay with  you until you are awake and alert.  Take over-the-counter and prescription medicines only as told by your health care provider.  If you smoke, do not smoke without supervision.  Keep all follow-up visits as told by your health care provider. This is important. Contact a health care provider if:  You keep feeling nauseous or you keep vomiting.  You feel light-headed.  You develop a rash.  You have a fever. Get help right away if:  You have trouble breathing. This information is not intended to replace advice given to you by your health care provider. Make sure you discuss any questions you have with your health care provider. Document Released: 08/01/2013 Document Revised: 03/15/2016 Document Reviewed: 01/31/2016 Elsevier Interactive Patient Education  2019 [employer].

## 2019-02-19 NOTE — Op Note (Signed)
[facility]    OPERATIVE NOTE   PROCEDURE: 1.   Left brachiocephalic arteriovenous fistula cannulation under ultrasound guidance 2.   Left arm fistulagram including central venogram 3.   Percutaneous transluminal angioplasty of perianastomotic cephalic vein stenosis with 6 mm diameter Lutonix drug-coated and 5 mm diameter high-pressure angioplasty balloons  PRE-OPERATIVE DIAGNOSIS: 1. ESRD 2. Poorly functional not yet mature left brachiocephalic AVF  POST-OPERATIVE DIAGNOSIS: same as above   SURGEON: Leotis Pain, MD  ANESTHESIA: local with MCS  ESTIMATED BLOOD LOSS: 5 cc  FINDING(S): 1. Perianastomotic cephalic vein stenosis in the 70 to 80% range.  There was narrowing in the cephalic vein at the access site but this appeared to be related to the sheath in place and some hematoma around the sheath.  After this, the vein then normalized and there were no other focal stenoses through the remainder of the cephalic vein.  The central venous circulation appear to be patent.  SPECIMEN(S):  None  CONTRAST: 30 cc  FLUORO TIME: 2.2 minutes  MODERATE CONSCIOUS SEDATION TIME: Approximately 20 minutes with 1 mg of Versed and 75 Mcg of Fentanyl   INDICATIONS: Anita Wells is a 78 y.o. female who presents with malfunctioning left brachiocephalic arteriovenous fistula.  The patient is scheduled for left arm fistulagram.  The patient is aware the risks include but are not limited to: bleeding, infection, thrombosis of the cannulated access, and possible anaphylactic reaction to the contrast.  The patient is aware of the risks of the procedure and elects to proceed forward.  DESCRIPTION: After full informed written consent was obtained, the patient was brought back to the angiography suite and placed supine upon the angiography table.  The patient was connected to monitoring equipment. Moderate conscious sedation was administered with a face to face encounter with the  patient throughout the procedure with my supervision of the RN administering medicines and monitoring the patient's vital signs and mental status throughout from the start of the procedure until the patient was taken to the recovery room. The left arm was prepped and draped in the standard fashion for a percutaneous access intervention.  Under ultrasound guidance, the left brachiocephalic arteriovenous fistula was cannulated with a micropuncture needle under direct ultrasound guidance where it was patent and a permanent image was performed.  This was done in a retrograde fashion in the mid upper arm.  The microwire was advanced into the fistula and the needle was exchanged for the a microsheath.  I then upsized to a 6 Fr Sheath and imaging was performed.  Hand injections were completed to image the access including the central venous system. This demonstrated perianastomotic cephalic vein stenosis in the 70 to 80% range.  There was narrowing in the cephalic vein at the access site but this appeared to be related to the sheath in place and some hematoma around the sheath.  After this, the vein then normalized and there were no other focal stenoses through the remainder of the cephalic vein.  The central venous circulation appear to be patent.  Based on the images, this patient will need intervention to the perianastomotic stenosis.  I felt the area in the mid upper arm cephalic vein around the sheath was because of the sheath and not an actual stenosis and this was also how it appeared on the original ultrasound as I was getting access. I then gave the patient 3000 units of intravenous heparin.  I then crossed the stenosis with a Magic Tourqe  wire.  Based on the imaging, a 6 mm x 6 cm  Lutonix drug coated angioplasty balloon was selected.  The balloon was centered around the perianastomotic cephalic vein stenosis and inflated to 12 ATM for 1 minute(s) but the waist would not resolve.  I then exchanged for a 5 mm  diameter by 4 cm length high-pressure angioplasty balloon and we inflated this to 24 atm and the waist resolved.  On completion imaging, a less than 20 % residual stenosis was present.     Based on the completion imaging, no further intervention is necessary.  The wire and balloon were removed from the sheath.  A 4-0 Monocryl purse-string suture was sewn around the sheath.  The sheath was removed while tying down the suture.  A sterile bandage was applied to the puncture site.  COMPLICATIONS: None  CONDITION: Stable   Leotis Pain  02/19/2019 9:37 AM   This note was created with Dragon Medical transcription system. Any errors in dictation are purely unintentional.

## 2019-02-19 NOTE — H&P (Signed)
[facility] History & Physical Update  The patient was interviewed and re-examined.  The patient's previous History and Physical has been reviewed and is unchanged.  There is no change in the plan of care. We plan to proceed with the scheduled procedure.  Leotis Pain, MD  02/19/2019, 8:10 AM

## 2019-02-19 NOTE — H&P (Signed)
[facility] Admission History & Physical  MRN : [id number]  Anita Wells is a 78 y.o. (03-18-1941) female who presents with chief complaint of No chief complaint on file. Marland Kitchen  History of Present Illness: I am asked to evaluate the patient by the dialysis center. The patient was sent here because they were unable to achieve adequate dialysis this morning. Furthermore the Center states there is very poor thrill and bruit. The patient has not yet starting using the AVF and it is not yet matured. The patient is unaware of any other change.  Patient denies pain or tenderness overlying the access.  There is no pain with dialysis.  The patient denies hand pain or finger pain consistent with steal syndrome.   There have not any past interventions or declots of this access.  The patient is not chronically hypotensive on dialysis.  Current Facility-Administered Medications  Medication Dose Route Frequency Provider Last Rate Last Dose  . ceFAZolin (ANCEF) IVPB 1 g/50 mL premix  1 g Intravenous Once Kris Hartmann, NP        Past Medical History:  Diagnosis Date  . Anemia   . Barrett's esophagus   . Carotid stenosis, bilateral   . Chronic kidney disease   . GERD (gastroesophageal reflux disease)   . Hyperlipidemia   . Hyperparathyroidism ([place])   . Hypertension   . Renal artery stenosis ([facility])    left    Past Surgical History:  Procedure Laterality Date  . AV FISTULA PLACEMENT  12/21/2018   Procedure: ARTERIOVENOUS (AV) FISTULA CREATION;  Surgeon: Algernon Huxley, MD;  Location: [facility]S;  Service: Vascular;;  . COLON RESECTION    . COLON SURGERY    . colonoscpy    . DIALYSIS/PERMA CATHETER INSERTION N/A 11/15/2018   Procedure: DIALYSIS/PERMA CATHETER INSERTION;  Surgeon: Algernon Huxley, MD;  Location: [facility];  Service: Cardiovascular;  Laterality: N/A;  . RENAL ARTERY STENT Left     Social History Social History   Tobacco Use  . Smoking status:  Current Every Day Smoker    Packs/day: 0.25    Years: 50.00    Pack years: 12.50  . Smokeless tobacco: Never Used  Substance Use Topics  . Alcohol use: Not Currently  . Drug use: Never    Family History No family history on file.  No family history of bleeding or clotting disorders, autoimmune disease or porphyria  Allergies  Allergen Reactions  . Ace Inhibitors Cough  . Amlodipine Other (See Comments)    Peripheral edema  . Clopidogrel Other (See Comments)    Mouth swelling     REVIEW OF SYSTEMS (Negative unless checked)  Constitutional: [] Weight loss  [] Fever  [] Chills Cardiac: [] Chest pain   [] Chest pressure   [] Palpitations   [] Shortness of breath when laying flat   [] Shortness of breath at rest   [x] Shortness of breath with exertion. Vascular:  [] Pain in legs with walking   [] Pain in legs at rest   [] Pain in legs when laying flat   [] Claudication   [] Pain in feet when walking  [] Pain in feet at rest  [] Pain in feet when laying flat   [] History of DVT   [] Phlebitis   [] Swelling in legs   [] Varicose veins   [] Non-healing ulcers Pulmonary:   [] Uses home oxygen   [] Productive cough   [] Hemoptysis   [] Wheeze  [] COPD   [] Asthma Neurologic:  [] Dizziness  [] Blackouts   [] Seizures   [] History of  stroke   [] History of TIA  [] Aphasia   [] Temporary blindness   [] Dysphagia   [] Weakness or numbness in arms   [] Weakness or numbness in legs Musculoskeletal:  [x] Arthritis   [] Joint swelling   [] Joint pain   [] Low back pain Hematologic:  [] Easy bruising  [] Easy bleeding   [] Hypercoagulable state   [] Anemic  [] Hepatitis Gastrointestinal:  [] Blood in stool   [] Vomiting blood  [x] Gastroesophageal reflux/heartburn   [] Difficulty swallowing. Genitourinary:  [x] Chronic kidney disease   [] Difficult urination  [] Frequent urination  [] Burning with urination   [] Blood in urine Skin:  [] Rashes   [] Ulcers   [] Wounds Psychological:  [] History of anxiety   []  History of major depression.  Physical  Examination  There were no vitals filed for this visit. There is no height or weight on file to calculate BMI. Gen: WD/WN, NAD Head: [place]/AT, No temporalis wasting.  Ear/Nose/Throat: Hearing grossly intact, nares w/o erythema or drainage, oropharynx w/o Erythema/Exudate,  Eyes: Conjunctiva clear, sclera non-icteric Neck: Trachea midline.  No JVD.  Pulmonary:  Good air movement, respirations not labored, no use of accessory muscles.  Cardiac: RRR, normal S1, S2. Vascular: weak thrill and bruit left arm AVF Vessel Right Left  Radial Palpable Palpable   Musculoskeletal: M/S 5/5 throughout.  Extremities without ischemic changes.  No deformity or atrophy.  Neurologic: Sensation grossly intact in extremities.  Symmetrical.  Speech is fluent. Motor exam as listed above. Psychiatric: Judgment intact, Mood & affect appropriate for pt's clinical situation. Dermatologic: No rashes or ulcers noted.  No cellulitis or open wounds.    CBC Lab Results  Component Value Date   WBC 4.6 12/18/2018   HGB 9.5 (L) 12/21/2018   HCT 28.0 (L) 12/21/2018   MCV 100.0 12/18/2018   PLT 238 12/18/2018    BMET    Component Value Date/Time   NA 133 (L) 12/21/2018 1213   K 3.1 (L) 12/21/2018 1213   CL 96 (L) 12/18/2018 1536   CO2 26 12/18/2018 1536   GLUCOSE 105 (H) 12/21/2018 1213   BUN 35 (H) 12/18/2018 1536   CREATININE 3.29 (H) 12/18/2018 1536   CALCIUM 8.5 (L) 12/18/2018 1536   GFRNONAA 13 (L) 12/18/2018 1536   GFRAA 15 (L) 12/18/2018 1536   CrCl cannot be calculated (Patient's most recent lab result is older than the maximum 21 days allowed.).  COAG Lab Results  Component Value Date   INR 0.8 12/18/2018    Radiology Vas US Duplex Dialysis Access (avf,avg)  Result Date: 02/06/2019 DIALYSIS ACCESS Access Site: Left Upper Extremity. Access Type: Brachial-cephalic AVF. History: New left BrachioCephalic AVF created 1/75/1025. Comparison Study: none Performing Technologist: Concha Norway RVT   Examination Guidelines: A complete evaluation includes B-mode imaging, spectral Doppler, color Doppler, and power Doppler as needed of all accessible portions of each vessel. Unilateral testing is considered an integral part of a complete examination. Limited examinations for reoccurring indications may be performed as noted.  Findings: +--------------------+----------+-----------------+--------+ AVF                 PSV (cm/s)Flow Vol (mL/min)Comments +--------------------+----------+-----------------+--------+ Native artery inflow   178           842                +--------------------+----------+-----------------+--------+ AVF Anastomosis        379                              +--------------------+----------+-----------------+--------+  +---------------+----------+-------------+----------+--------+  OUTFLOW VEIN   PSV (cm/s)Diameter (cm)Depth (cm)Describe +---------------+----------+-------------+----------+--------+ Subclavian vein   222                                    +---------------+----------+-------------+----------+--------+ Axillary vein     272                                    +---------------+----------+-------------+----------+--------+ Confluence        293                                    +---------------+----------+-------------+----------+--------+ Prox UA           127                                    +---------------+----------+-------------+----------+--------+ Mid UA             87                                    +---------------+----------+-------------+----------+--------+ Dist UA           276        0.44                        +---------------+----------+-------------+----------+--------+  +----------------+-------------+----------+---------+----------+---------------+                 Diameter (cm)Depth (cm)BranchingPSV (cm/s)  Flow Volume                                                                 (ml/min)     +----------------+-------------+----------+---------+----------+---------------+ Left rad art                                        58                    distal                                                                    +----------------+-------------+----------+---------+----------+---------------+ Antegrade                                                                 +----------------+-------------+----------+---------+----------+---------------+  Summary: Patent new left BrachioCephalic AVF with increased velocities at the arterial anastomosis site and at the confluence. AVF has not been used for  dialysis at this time.  *See table(s) above for measurements and observations.  Diagnosing physician: Leotis Pain MD Electronically signed by Leotis Pain MD on 02/06/2019 at 12:12:44 PM.    --------------------------------------------------------------------------------   Final     Assessment/Plan 1.  Complication dialysis device with dysfunction of AV access:  Patient's dialysis access is malfunctioning. The patient will undergo angiography and correction of any problems using interventional techniques with the hope of restoring function to the access.  The risks and benefits were described to the patient.  All questions were answered.  The patient agrees to proceed with angiography and intervention. Potassium will be drawn to ensure that it is an appropriate level prior to performing intervention. 2.  End-stage renal disease requiring hemodialysis:  Patient will continue dialysis therapy without further interruption if a successful intervention is not achieved then a tunneled catheter will be placed. Dialysis has already been arranged. 3.  Hypertension:  Patient will continue medical management; nephrology is following no changes in oral medications.     Leotis Pain, MD  02/19/2019 8:11 AM

## 2019-02-28 ENCOUNTER — Ambulatory Visit
Admission: RE | Admit: 2019-02-28 | Discharge: 2019-02-28 | Disposition: A | Discharge status: Home / Self Care | Payer: Medicare Other | Source: Ambulatory Visit | Attending: Student | Admitting: Student

## 2019-03-01 ENCOUNTER — Inpatient Hospital Stay: Admission: RE | Admit: 2019-03-01 | Payer: Medicare Other | Source: Ambulatory Visit

## 2019-03-20 ENCOUNTER — Encounter (INDEPENDENT_AMBULATORY_CARE_PROVIDER_SITE_OTHER): Payer: Self-pay | Admitting: Nurse Practitioner

## 2019-03-20 ENCOUNTER — Ambulatory Visit (INDEPENDENT_AMBULATORY_CARE_PROVIDER_SITE_OTHER): Payer: Medicare Other

## 2019-03-20 ENCOUNTER — Other Ambulatory Visit: Payer: Self-pay

## 2019-03-20 ENCOUNTER — Ambulatory Visit (INDEPENDENT_AMBULATORY_CARE_PROVIDER_SITE_OTHER): Payer: Medicare Other | Admitting: Nurse Practitioner

## 2019-03-20 ENCOUNTER — Other Ambulatory Visit (INDEPENDENT_AMBULATORY_CARE_PROVIDER_SITE_OTHER): Payer: Self-pay | Admitting: Vascular Surgery

## 2019-03-20 VITALS — BP 133/47 | HR 66 | Resp 16 | Wt 120.6 lb

## 2019-03-20 DIAGNOSIS — Z9862 Peripheral vascular angioplasty status: Secondary | ICD-10-CM

## 2019-03-20 DIAGNOSIS — Z79899 Other long term (current) drug therapy: Secondary | ICD-10-CM

## 2019-03-20 DIAGNOSIS — Z992 Dependence on renal dialysis: Secondary | ICD-10-CM | POA: Diagnosis not present

## 2019-03-20 DIAGNOSIS — N186 End stage renal disease: Secondary | ICD-10-CM

## 2019-03-20 DIAGNOSIS — E785 Hyperlipidemia, unspecified: Secondary | ICD-10-CM

## 2019-03-20 DIAGNOSIS — F17210 Nicotine dependence, cigarettes, uncomplicated: Secondary | ICD-10-CM

## 2019-03-20 DIAGNOSIS — I120 Hypertensive chronic kidney disease with stage 5 chronic kidney disease or end stage renal disease: Secondary | ICD-10-CM

## 2019-03-20 DIAGNOSIS — I10 Essential (primary) hypertension: Secondary | ICD-10-CM

## 2019-03-20 DIAGNOSIS — D631 Anemia in chronic kidney disease: Secondary | ICD-10-CM

## 2019-03-22 ENCOUNTER — Encounter (INDEPENDENT_AMBULATORY_CARE_PROVIDER_SITE_OTHER): Payer: Self-pay

## 2019-03-23 ENCOUNTER — Encounter (INDEPENDENT_AMBULATORY_CARE_PROVIDER_SITE_OTHER): Payer: Self-pay | Admitting: Nurse Practitioner

## 2019-03-23 NOTE — Progress Notes (Signed)
SUBJECTIVE:  Patient ID: Anita Wells, female    DOB: December 14, 1940, 78 y.o.   MRN: [id number] No chief complaint on file.   HPI  Anita Wells is a 78 y.o. female that is following up post fistulogram on 02/19/2019.The patient returns to the office for followup status post intervention of the dialysis access left brachiocephalic AV fistula. Following the intervention the excess function was unchanged per the patient.  The patient continues to be experiencing increased bleeding times following decannulation and increased recirculation with diminished efficiency of their dialysis. The patient denies an increase in arm swelling. At the present time the patient denies hand pain.  The patient denies amaurosis fugax or recent TIA symptoms. There are no recent neurological changes noted. The patient denies claudication symptoms or rest pain symptoms. The patient denies history of DVT, PE or superficial thrombophlebitis. The patient denies recent episodes of angina or shortness of breath.   Today the patient has a flow volume of 1564 however she has hemodynamically significant velocity elevations at the AV fistula anastomosis as well as in the distal upper arm. Past Medical History:  Diagnosis Date  . Anemia   . Barrett's esophagus   . Carotid stenosis, bilateral   . Chronic kidney disease   . GERD (gastroesophageal reflux disease)   . Hyperlipidemia   . Hyperparathyroidism ([place])   . Hypertension   . Renal artery stenosis ([facility])    left    Past Surgical History:  Procedure Laterality Date  . A/V FISTULAGRAM Left 02/19/2019   Procedure: A/V FISTULAGRAM;  Surgeon: Algernon Huxley, MD;  Location: [place] [facility];  Service: Cardiovascular;  Laterality: Left;  . AV FISTULA PLACEMENT  12/21/2018   Procedure: ARTERIOVENOUS (AV) FISTULA CREATION;  Surgeon: Algernon Huxley, MD;  Location: [facility];  Service: Vascular;;  . COLON RESECTION    . COLON SURGERY    . colonoscpy    . DIALYSIS/PERMA  CATHETER INSERTION N/A 11/15/2018   Procedure: DIALYSIS/PERMA CATHETER INSERTION;  Surgeon: Algernon Huxley, MD;  Location: [facility] [facility];  Service: Cardiovascular;  Laterality: N/A;  . RENAL ARTERY STENT Left     Social History   Socioeconomic History  . Marital status: Married    Spouse name: Not on file  . Number of children: Not on file  . Years of education: Not on file  . Highest education level: Not on file  Occupational History  . Not on file  Social Needs  . Financial resource strain: Not on file  . Food insecurity:    Worry: Not on file    Inability: Not on file  . Transportation needs:    Medical: Not on file    Non-medical: Not on file  Tobacco Use  . Smoking status: Current Every Day Smoker    Packs/day: 0.25    Years: 50.00    Pack years: 12.50  . Smokeless tobacco: Never Used  Substance and Sexual Activity  . Alcohol use: Not Currently  . Drug use: Never  . Sexual activity: Not on file  Lifestyle  . Physical activity:    Days per week: Not on file    Minutes per session: Not on file  . Stress: Not on file  Relationships  . Social connections:    Talks on phone: Not on file    Gets together: Not on file    Attends religious service: Not on file    Active member of club or organization: Not on file  Attends meetings of clubs or organizations: Not on file    Relationship status: Not on file  . Intimate partner violence:    Fear of current or ex partner: Not on file    Emotionally abused: Not on file    Physically abused: Not on file    Forced sexual activity: Not on file  Other Topics Concern  . Not on file  Social History Narrative  . Not on file    History reviewed. No pertinent family history.  Allergies  Allergen Reactions  . Clopidogrel Swelling    Mouth swelling  . Ace Inhibitors Cough  . Amlodipine Other (See Comments)    Peripheral edema     Review of Systems   Review of Systems: Negative Unless Checked Constitutional:  [] Weight loss  [] Fever  [] Chills Cardiac: [] Chest pain   []  Atrial Fibrillation  [] Palpitations   [] Shortness of breath when laying flat   [] Shortness of breath with exertion. [] Shortness of breath at rest Vascular:  [] Pain in legs with walking   [] Pain in legs with standing [] Pain in legs when laying flat   [] Claudication    [] Pain in feet when laying flat    [] History of DVT   [] Phlebitis   [] Swelling in legs   [] Varicose veins   [] Non-healing ulcers Pulmonary:   [] Uses home oxygen   [] Productive cough   [] Hemoptysis   [] Wheeze  [] COPD   [] Asthma Neurologic:  [] Dizziness   [] Seizures  [] Blackouts [] History of stroke   [] History of TIA  [] Aphasia   [] Temporary Blindness   [] Weakness or numbness in arm   [] Weakness or numbness in leg Musculoskeletal:   [] Joint swelling   [] Joint pain   [] Low back pain  []  History of Knee Replacement [] Arthritis [] back Surgeries  []  Spinal Stenosis    Hematologic:  [] Easy bruising  [] Easy bleeding   [] Hypercoagulable state   [x] Anemic Gastrointestinal:  [] Diarrhea   [] Vomiting  [] Gastroesophageal reflux/heartburn   [] Difficulty swallowing. [] Abdominal pain Genitourinary:  [x] Chronic kidney disease   [] Difficult urination  [] Anuric   [] Blood in urine [] Frequent urination  [] Burning with urination   [] Hematuria Skin:  [] Rashes   [] Ulcers [] Wounds Psychological:  [x] History of anxiety   [x]  History of major depression  []  Memory Difficulties      OBJECTIVE:   Physical Exam  BP (!) 133/47 (BP Location: Right Arm)   Pulse 66   Resp 16   Wt 120 lb 9.6 oz (54.7 kg)   BMI 19.47 kg/m   Gen: WD/WN, NAD Head: [place]/AT, No temporalis wasting.  Ear/Nose/Throat: Hearing grossly intact, nares w/o erythema or drainage Eyes: PER, EOMI, sclera nonicteric.  Neck: Supple, no masses.  No JVD.  Pulmonary:  Good air movement, no use of accessory muscles.  Cardiac: RRR Vascular:  Vessel Right Left  Radial Palpable Palpable  Brachial Palpable Palpable  Femoral Palpable  Palpable  Popliteal Palpable Palpable  Dorsalis Pedis Palpable Palpable  Posterior Tibial Palpable Palpable   Gastrointestinal: soft, non-distended. No guarding/no peritoneal signs.  Musculoskeletal: M/S 5/5 throughout.  No deformity or atrophy.  Neurologic: Pain and light touch intact in extremities.  Symmetrical.  Speech is fluent. Motor exam as listed above. Psychiatric: Judgment intact, Mood & affect appropriate for pt's clinical situation. Dermatologic: No Venous rashes. No Ulcers Noted.  No changes consistent with cellulitis. Lymph : No Cervical lymphadenopathy, no lichenification or skin changes of chronic lymphedema.       ASSESSMENT AND PLAN:  1. ESRD on dialysis [facility]) Recommend:  The  patient is experiencing increasing problems with their dialysis access.  Patient should have a fistulagram with the intention for intervention.  The intention for intervention is to restore appropriate flow and prevent thrombosis and possible loss of the access.  As well as improve the quality of dialysis therapy.  The risks, benefits and alternative therapies were reviewed in detail with the patient.  All questions were answered.  The patient agrees to proceed with angio/intervention.      2. Essential hypertension Continue antihypertensive medications as already ordered, these medications have been reviewed and there are no changes at this time.   3. Hyperlipidemia, unspecified hyperlipidemia type Continue statin as ordered and reviewed, no changes at this time   4. Anemia due to stage 5 chronic kidney disease, not on chronic dialysis [facility]) Currently managed via nephrologist, however no manifestations at this time.   Current Outpatient Medications on File Prior to Visit  Medication Sig Dispense Refill  . aspirin EC 81 MG tablet Take 81 mg by mouth daily.    Marland Kitchen atorvastatin (LIPITOR) 40 MG tablet Take 40 mg by mouth daily at 6 PM.    . calcitRIOL (ROCALTROL) 0.25 MCG capsule Take 0.25  mcg by mouth 3 (three) times a week. Take on Mondays, Wednesdays, and Fridays    . cloNIDine (CATAPRES) 0.2 MG tablet Take 0.2 mg by mouth 2 (two) times daily. Take 1 tablet in am and 2 tablets in pm    . docusate sodium (COLACE) 100 MG capsule Take 100 mg by mouth 2 (two) times daily.    Marland Kitchen escitalopram (LEXAPRO) 10 MG tablet Take 10 mg by mouth daily.    . ferrous sulfate 325 (65 FE) MG tablet Take 325 mg by mouth daily with breakfast.    . furosemide (LASIX) 80 MG tablet Take 80 mg by mouth daily.    . hydrALAZINE (APRESOLINE) 100 MG tablet Take 100 mg by mouth 3 (three) times daily.    Marland Kitchen HYDROcodone-acetaminophen (NORCO) 5-325 MG tablet Take 1 tablet by mouth every 6 (six) hours as needed for moderate pain. (Patient not taking: Reported on 02/19/2019) 30 tablet 0  . losartan (COZAAR) 100 MG tablet Take 100 mg by mouth daily.    . metoprolol tartrate (LOPRESSOR) 100 MG tablet Take 100 mg by mouth 2 (two) times daily.    . mirtazapine (REMERON) 7.5 MG tablet Take 7.5 mg by mouth at bedtime.    . multivitamin (RENA-VIT) TABS tablet Take 1 tablet by mouth 3 (three) times a week.    Marland Kitchen omeprazole (PRILOSEC) 40 MG capsule Take 40 mg by mouth daily.    . sodium bicarbonate 325 MG tablet Take 325 mg by mouth 2 (two) times daily.    Marland Kitchen torsemide (DEMADEX) 10 MG tablet Take 10 mg by mouth daily. May take 1 additional tablet as needed for swelling     No current facility-administered medications on file prior to visit.     There are no Patient Instructions on file for this visit. No follow-ups on file.   Kris Hartmann, NP  This note was completed with Sales executive.  Any errors are purely unintentional.

## 2019-03-27 ENCOUNTER — Other Ambulatory Visit (INDEPENDENT_AMBULATORY_CARE_PROVIDER_SITE_OTHER): Payer: Self-pay | Admitting: Nurse Practitioner

## 2019-04-02 ENCOUNTER — Telehealth (INDEPENDENT_AMBULATORY_CARE_PROVIDER_SITE_OTHER): Payer: Self-pay | Admitting: Vascular Surgery

## 2019-04-02 NOTE — Telephone Encounter (Signed)
Patient's fistulagram on Thursday 04/05/2019 with Dr. Lucky Cowboy has been canceled.

## 2019-04-04 ENCOUNTER — Inpatient Hospital Stay: Admission: RE | Admit: 2019-04-04 | Payer: Medicare Other | Source: Ambulatory Visit

## 2019-04-05 ENCOUNTER — Ambulatory Visit: Admit: 2019-04-05 | Payer: Medicare Other | Admitting: Vascular Surgery

## 2019-04-05 SURGERY — A/V FISTULAGRAM
Anesthesia: Moderate Sedation | Site: Arm Upper | Laterality: Left

## 2019-07-27 ENCOUNTER — Other Ambulatory Visit: Payer: Self-pay

## 2019-07-27 ENCOUNTER — Encounter: Payer: Self-pay | Admitting: Emergency Medicine

## 2019-07-27 ENCOUNTER — Inpatient Hospital Stay
Admission: EM | Admit: 2019-07-27 | Discharge: 2019-07-30 | DRG: 189 | Disposition: A | Discharge status: Home / Self Care | Payer: Medicare Other | Attending: Internal Medicine | Admitting: Internal Medicine

## 2019-07-27 ENCOUNTER — Emergency Department: Payer: Medicare Other

## 2019-07-27 DIAGNOSIS — Z20828 Contact with and (suspected) exposure to other viral communicable diseases: Secondary | ICD-10-CM | POA: Diagnosis present

## 2019-07-27 DIAGNOSIS — R0902 Hypoxemia: Secondary | ICD-10-CM

## 2019-07-27 DIAGNOSIS — I120 Hypertensive chronic kidney disease with stage 5 chronic kidney disease or end stage renal disease: Secondary | ICD-10-CM | POA: Diagnosis present

## 2019-07-27 DIAGNOSIS — E785 Hyperlipidemia, unspecified: Secondary | ICD-10-CM | POA: Diagnosis present

## 2019-07-27 DIAGNOSIS — N186 End stage renal disease: Secondary | ICD-10-CM | POA: Diagnosis present

## 2019-07-27 DIAGNOSIS — F329 Major depressive disorder, single episode, unspecified: Secondary | ICD-10-CM | POA: Diagnosis present

## 2019-07-27 DIAGNOSIS — J189 Pneumonia, unspecified organism: Secondary | ICD-10-CM | POA: Diagnosis present

## 2019-07-27 DIAGNOSIS — K219 Gastro-esophageal reflux disease without esophagitis: Secondary | ICD-10-CM | POA: Diagnosis present

## 2019-07-27 DIAGNOSIS — Z7951 Long term (current) use of inhaled steroids: Secondary | ICD-10-CM | POA: Diagnosis not present

## 2019-07-27 DIAGNOSIS — N2581 Secondary hyperparathyroidism of renal origin: Secondary | ICD-10-CM | POA: Diagnosis present

## 2019-07-27 DIAGNOSIS — T82590A Other mechanical complication of surgically created arteriovenous fistula, initial encounter: Secondary | ICD-10-CM | POA: Diagnosis present

## 2019-07-27 DIAGNOSIS — Y832 Surgical operation with anastomosis, bypass or graft as the cause of abnormal reaction of the patient, or of later complication, without mention of misadventure at the time of the procedure: Secondary | ICD-10-CM | POA: Diagnosis present

## 2019-07-27 DIAGNOSIS — Z79899 Other long term (current) drug therapy: Secondary | ICD-10-CM | POA: Diagnosis not present

## 2019-07-27 DIAGNOSIS — I272 Pulmonary hypertension, unspecified: Secondary | ICD-10-CM | POA: Diagnosis present

## 2019-07-27 DIAGNOSIS — J811 Chronic pulmonary edema: Secondary | ICD-10-CM

## 2019-07-27 DIAGNOSIS — J810 Acute pulmonary edema: Secondary | ICD-10-CM | POA: Diagnosis present

## 2019-07-27 DIAGNOSIS — I071 Rheumatic tricuspid insufficiency: Secondary | ICD-10-CM | POA: Diagnosis present

## 2019-07-27 DIAGNOSIS — F17210 Nicotine dependence, cigarettes, uncomplicated: Secondary | ICD-10-CM | POA: Diagnosis present

## 2019-07-27 DIAGNOSIS — J9601 Acute respiratory failure with hypoxia: Secondary | ICD-10-CM | POA: Diagnosis present

## 2019-07-27 DIAGNOSIS — Z7982 Long term (current) use of aspirin: Secondary | ICD-10-CM | POA: Diagnosis not present

## 2019-07-27 DIAGNOSIS — D631 Anemia in chronic kidney disease: Secondary | ICD-10-CM | POA: Diagnosis present

## 2019-07-27 DIAGNOSIS — R0602 Shortness of breath: Secondary | ICD-10-CM | POA: Diagnosis present

## 2019-07-27 DIAGNOSIS — D509 Iron deficiency anemia, unspecified: Secondary | ICD-10-CM | POA: Diagnosis present

## 2019-07-27 DIAGNOSIS — D72829 Elevated white blood cell count, unspecified: Secondary | ICD-10-CM

## 2019-07-27 DIAGNOSIS — Z992 Dependence on renal dialysis: Secondary | ICD-10-CM

## 2019-07-27 LAB — BLOOD GAS, VENOUS
Acid-Base Excess: 3.1 mmol/L — ABNORMAL HIGH (ref 0.0–2.0)
Bicarbonate: 29.5 mmol/L — ABNORMAL HIGH (ref 20.0–28.0)
O2 Saturation: 22.6 %
Patient temperature: 37
pCO2, Ven: 51 mmHg (ref 44.0–60.0)
pH, Ven: 7.37 (ref 7.250–7.430)
pO2, Ven: <31 mmHg — CL (ref 32.0–45.0)

## 2019-07-27 LAB — CBC WITH DIFFERENTIAL/PLATELET
Abs Immature Granulocytes: 0.12 10*3/uL — ABNORMAL HIGH (ref 0.00–0.07)
Basophils Absolute: 0 10*3/uL (ref 0.0–0.1)
Basophils Relative: 0 %
Eosinophils Absolute: 0 10*3/uL (ref 0.0–0.5)
Eosinophils Relative: 0 %
HCT: 23.9 % — ABNORMAL LOW (ref 36.0–46.0)
Hemoglobin: 7.6 g/dL — ABNORMAL LOW (ref 12.0–15.0)
Immature Granulocytes: 1 %
Lymphocytes Relative: 3 %
Lymphs Abs: 0.6 10*3/uL — ABNORMAL LOW (ref 0.7–4.0)
MCH: 31.1 pg (ref 26.0–34.0)
MCHC: 31.8 g/dL (ref 30.0–36.0)
MCV: 98 fL (ref 80.0–100.0)
Monocytes Absolute: 1.3 10*3/uL — ABNORMAL HIGH (ref 0.1–1.0)
Monocytes Relative: 7 %
Neutro Abs: 16.8 10*3/uL — ABNORMAL HIGH (ref 1.7–7.7)
Neutrophils Relative %: 89 %
Platelets: 225 10*3/uL (ref 150–400)
RBC: 2.44 MIL/uL — ABNORMAL LOW (ref 3.87–5.11)
RDW: 15.7 % — ABNORMAL HIGH (ref 11.5–15.5)
WBC: 18.8 10*3/uL — ABNORMAL HIGH (ref 4.0–10.5)
nRBC: 0.3 % — ABNORMAL HIGH (ref 0.0–0.2)

## 2019-07-27 LAB — SARS CORONAVIRUS 2 BY RT PCR (HOSPITAL ORDER, PERFORMED IN ~~LOC~~ HOSPITAL LAB): SARS Coronavirus 2: NEGATIVE

## 2019-07-27 LAB — BASIC METABOLIC PANEL
Anion gap: 11 (ref 5–15)
BUN: 19 mg/dL (ref 8–23)
CO2: 28 mmol/L (ref 22–32)
Calcium: 8.4 mg/dL — ABNORMAL LOW (ref 8.9–10.3)
Chloride: 99 mmol/L (ref 98–111)
Creatinine, Ser: 2.57 mg/dL — ABNORMAL HIGH (ref 0.44–1.00)
GFR calc Af Amer: 20 mL/min — ABNORMAL LOW (ref >60)
GFR calc non Af Amer: 17 mL/min — ABNORMAL LOW (ref >60)
Glucose, Bld: 118 mg/dL — ABNORMAL HIGH (ref 70–99)
Potassium: 3.5 mmol/L (ref 3.5–5.1)
Sodium: 138 mmol/L (ref 135–145)

## 2019-07-27 LAB — PROCALCITONIN: Procalcitonin: 2.36 ng/mL

## 2019-07-27 LAB — BRAIN NATRIURETIC PEPTIDE: B Natriuretic Peptide: 2830 pg/mL — ABNORMAL HIGH (ref 0.0–100.0)

## 2019-07-27 LAB — TROPONIN I (HIGH SENSITIVITY): Troponin I (High Sensitivity): 22 ng/L — ABNORMAL HIGH (ref <18)

## 2019-07-27 LAB — LACTIC ACID, PLASMA: Lactic Acid, Venous: 2.2 mmol/L (ref 0.5–1.9)

## 2019-07-27 MED ORDER — ACETAMINOPHEN 650 MG RE SUPP: 650.0000 mg | Freq: Four times a day (QID) | RECTAL | Status: DC | PRN

## 2019-07-27 MED ORDER — CALCITRIOL 0.25 MCG PO CAPS: 0.2500 ug | ORAL_CAPSULE | ORAL | Status: DC

## 2019-07-27 MED ORDER — LOSARTAN POTASSIUM 50 MG PO TABS: 100.0000 mg | ORAL_TABLET | Freq: Every day | ORAL | Status: DC

## 2019-07-27 MED ORDER — CLONIDINE HCL 0.1 MG PO TABS
0.2000 mg | ORAL_TABLET | Freq: Every day | ORAL | Status: DC
  Administered 2019-07-27: 0.2 mg via ORAL
  Filled 2019-07-27: qty 2

## 2019-07-27 MED ORDER — ONDANSETRON HCL 4 MG PO TABS: 4.0000 mg | ORAL_TABLET | Freq: Four times a day (QID) | ORAL | Status: DC | PRN

## 2019-07-27 MED ORDER — DOCUSATE SODIUM 100 MG PO CAPS: 100.0000 mg | ORAL_CAPSULE | Freq: Two times a day (BID) | ORAL | Status: DC

## 2019-07-27 MED ORDER — METOPROLOL TARTRATE 50 MG PO TABS
100.0000 mg | ORAL_TABLET | Freq: Two times a day (BID) | ORAL | Status: DC
  Administered 2019-07-27: 100 mg via ORAL
  Filled 2019-07-27: qty 2

## 2019-07-27 MED ORDER — MIRTAZAPINE 15 MG PO TABS
7.5000 mg | ORAL_TABLET | Freq: Every day | ORAL | Status: DC
  Administered 2019-07-27 – 2019-07-29 (×3): 7.5 mg via ORAL
  Filled 2019-07-27 (×3): qty 1

## 2019-07-27 MED ORDER — SODIUM BICARBONATE 650 MG PO TABS
325.0000 mg | ORAL_TABLET | Freq: Two times a day (BID) | ORAL | Status: DC
  Administered 2019-07-27: 325 mg via ORAL
  Filled 2019-07-27: qty 1

## 2019-07-27 MED ORDER — FERROUS SULFATE 325 (65 FE) MG PO TABS
325.0000 mg | ORAL_TABLET | Freq: Every day | ORAL | Status: DC
  Administered 2019-07-29 – 2019-07-30 (×2): 325 mg via ORAL
  Filled 2019-07-27 (×2): qty 1

## 2019-07-27 MED ORDER — ATORVASTATIN CALCIUM 20 MG PO TABS
40.0000 mg | ORAL_TABLET | Freq: Every day | ORAL | Status: DC
  Administered 2019-07-28 – 2019-07-29 (×2): 40 mg via ORAL
  Filled 2019-07-27 (×2): qty 2

## 2019-07-27 MED ORDER — TORSEMIDE 10 MG PO TABS
10.0000 mg | ORAL_TABLET | Freq: Every day | ORAL | Status: DC
  Filled 2019-07-27: qty 1

## 2019-07-27 MED ORDER — CLONIDINE HCL 0.1 MG PO TABS: 0.1000 mg | ORAL_TABLET | Freq: Every day | ORAL | Status: DC

## 2019-07-27 MED ORDER — ESCITALOPRAM OXALATE 10 MG PO TABS
10.0000 mg | ORAL_TABLET | Freq: Every day | ORAL | Status: DC
  Administered 2019-07-29 – 2019-07-30 (×2): 10 mg via ORAL
  Filled 2019-07-27 (×3): qty 1

## 2019-07-27 MED ORDER — RENA-VITE PO TABS
1.0000 | ORAL_TABLET | ORAL | Status: DC
  Administered 2019-07-30: 1 via ORAL
  Filled 2019-07-27: qty 1

## 2019-07-27 MED ORDER — CHLORHEXIDINE GLUCONATE CLOTH 2 % EX PADS
6.0000 | MEDICATED_PAD | Freq: Every day | CUTANEOUS | Status: DC
  Administered 2019-07-28 – 2019-07-30 (×3): 6 via TOPICAL
  Filled 2019-07-27: qty 6

## 2019-07-27 MED ORDER — TRAZODONE HCL 50 MG PO TABS
25.0000 mg | ORAL_TABLET | Freq: Every evening | ORAL | Status: DC | PRN
  Administered 2019-07-28 – 2019-07-29 (×2): 25 mg via ORAL
  Filled 2019-07-27 (×2): qty 1

## 2019-07-27 MED ORDER — ONDANSETRON HCL 4 MG/2ML IJ SOLN: 4.0000 mg | Freq: Four times a day (QID) | INTRAMUSCULAR | Status: DC | PRN

## 2019-07-27 MED ORDER — DOCUSATE SODIUM 100 MG PO CAPS
100.0000 mg | ORAL_CAPSULE | Freq: Two times a day (BID) | ORAL | Status: DC
  Administered 2019-07-27 – 2019-07-30 (×4): 100 mg via ORAL
  Filled 2019-07-27 (×5): qty 1

## 2019-07-27 MED ORDER — LEVOFLOXACIN IN D5W 750 MG/150ML IV SOLN
750.0000 mg | Freq: Once | INTRAVENOUS | Status: AC
  Administered 2019-07-27: 750 mg via INTRAVENOUS
  Filled 2019-07-27: qty 150

## 2019-07-27 MED ORDER — HYDROCODONE-ACETAMINOPHEN 5-325 MG PO TABS: 1.0000 | ORAL_TABLET | ORAL | Status: DC | PRN

## 2019-07-27 MED ORDER — FUROSEMIDE 40 MG PO TABS: 80.0000 mg | ORAL_TABLET | Freq: Every day | ORAL | Status: DC

## 2019-07-27 MED ORDER — ACETAMINOPHEN 325 MG PO TABS
650.0000 mg | ORAL_TABLET | Freq: Four times a day (QID) | ORAL | Status: DC | PRN
  Administered 2019-07-30: 650 mg via ORAL
  Filled 2019-07-27: qty 2

## 2019-07-27 MED ORDER — ASPIRIN EC 81 MG PO TBEC
81.0000 mg | DELAYED_RELEASE_TABLET | Freq: Every day | ORAL | Status: DC
  Administered 2019-07-29 – 2019-07-30 (×2): 81 mg via ORAL
  Filled 2019-07-27 (×2): qty 1

## 2019-07-27 MED ORDER — HEPARIN SODIUM (PORCINE) 5000 UNIT/ML IJ SOLN
5000.0000 [IU] | Freq: Three times a day (TID) | INTRAMUSCULAR | Status: DC
  Administered 2019-07-28 – 2019-07-30 (×2): 5000 [IU] via SUBCUTANEOUS
  Filled 2019-07-27 (×4): qty 1

## 2019-07-27 MED ORDER — HYDRALAZINE HCL 50 MG PO TABS
100.0000 mg | ORAL_TABLET | Freq: Three times a day (TID) | ORAL | Status: DC
  Administered 2019-07-27: 100 mg via ORAL
  Filled 2019-07-27: qty 2

## 2019-07-27 MED ORDER — BISACODYL 5 MG PO TBEC: 5.0000 mg | DELAYED_RELEASE_TABLET | Freq: Every day | ORAL | Status: DC | PRN

## 2019-07-27 NOTE — ED Notes (Signed)
Pt returned to ED from dialysis 

## 2019-07-27 NOTE — ED Provider Notes (Signed)
[facility] Emergency Department Provider Note       Time seen: ----------------------------------------- 4:50 PM on 07/27/2019 -----------------------------------------   I have reviewed the triage vital signs and the nursing notes.  HISTORY   Chief Complaint No chief complaint on file.    HPI Anita Wells is a 78 y.o. female with a history of anemia, chronic kidney disease on dialysis, GERD, hyperlipidemia, hyperparathyroidism, hypertension, renal artery stenosis who presents to the ED for shortness of breath.  Patient presents with shortness of breath to the ER by EMS.  She was placed on CPAP prior to arrival.  Patient states she was dialyzed today but they could not pull off any significant fluid.  She denies fevers, chills or other complaints.  Past Medical History:  Diagnosis Date  . Anemia   . Barrett's esophagus   . Carotid stenosis, bilateral   . Chronic kidney disease   . GERD (gastroesophageal reflux disease)   . Hyperlipidemia   . Hyperparathyroidism ([place])   . Hypertension   . Renal artery stenosis [facility])    left    Patient Active Problem List   Diagnosis Date Noted  . ESRD on dialysis ([place]) 11/22/2018  . Essential hypertension 11/22/2018  . Hyperlipidemia 11/22/2018  . Anemia due to chronic kidney disease 11/12/2018    Past Surgical History:  Procedure Laterality Date  . A/V FISTULAGRAM Left 02/19/2019   Procedure: A/V FISTULAGRAM;  Surgeon: Algernon Huxley, MD;  Location: [place] [facility];  Service: Cardiovascular;  Laterality: Left;  . AV FISTULA PLACEMENT  12/21/2018   Procedure: ARTERIOVENOUS (AV) FISTULA CREATION;  Surgeon: Algernon Huxley, MD;  Location: [facility];  Service: Vascular;;  . COLON RESECTION    . COLON SURGERY    . colonoscpy    . DIALYSIS/PERMA CATHETER INSERTION N/A 11/15/2018   Procedure: DIALYSIS/PERMA CATHETER INSERTION;  Surgeon: Algernon Huxley, MD;  Location: [place] [facility];  Service: Cardiovascular;   Laterality: N/A;  . RENAL ARTERY STENT Left     Allergies Clopidogrel, Ace inhibitors, and Amlodipine  Social History Social History   Tobacco Use  . Smoking status: Current Every Day Smoker    Packs/day: 0.25    Years: 50.00    Pack years: 12.50  . Smokeless tobacco: Never Used  Substance Use Topics  . Alcohol use: Not Currently  . Drug use: Never   Review of Systems Constitutional: Negative for fever. Cardiovascular: Negative for chest pain. Respiratory: Positive for shortness of breath Gastrointestinal: Negative for abdominal pain, vomiting and diarrhea. Musculoskeletal: Negative for back pain. Skin: Negative for rash. Neurological: Negative for headaches, focal weakness or numbness.  All systems negative/normal/unremarkable except as stated in the HPI  ____________________________________________   PHYSICAL EXAM:  VITAL SIGNS: ED Triage Vitals  Enc Vitals Group     BP      Pulse      Resp      Temp      Temp src      SpO2      Weight      Height      Head Circumference      Peak Flow      Pain Score      Pain Loc      Pain Edu?      Excl. in [place]?    Constitutional: Alert and oriented.  Mild to moderate distress Eyes: Conjunctivae are normal. Normal extraocular movements. Cardiovascular: Normal rate, regular rhythm. No murmurs, rubs, or gallops.  Respiratory: Tachypnea with mostly clear breath sounds, trace rales Gastrointestinal: Soft and nontender. Normal bowel sounds Musculoskeletal: Nontender with normal range of motion in extremities. No lower extremity tenderness nor edema. Neurologic:  Normal speech and language. No gross focal neurologic deficits are appreciated.  Skin:  Skin is warm, dry and intact. No rash noted. Psychiatric: Mood and affect are normal. Speech and behavior are normal.  ____________________________________________  EKG: Interpreted by me.  Sinus rhythm the rate of 70 bpm, LVH, anterior Q waves, long QT, normal  axis  ____________________________________________  ED COURSE:  As part of my medical decision making, I reviewed the following data within the [place] History obtained from family if available, nursing notes, old chart and ekg, as well as notes from prior ED visits. Patient presented for dyspnea, we will assess with labs and imaging as indicated at this time.   Procedures  Anita Wells was evaluated in Emergency Department on 07/27/2019 for the symptoms described in the history of present illness. She was evaluated in the context of the global COVID-19 pandemic, which necessitated consideration that the patient might be at risk for infection with the SARS-CoV-2 virus that causes COVID-19. Institutional protocols and algorithms that pertain to the evaluation of patients at risk for COVID-19 are in a state of rapid change based on information released by regulatory bodies including the CDC and federal and state organizations. These policies and algorithms were followed during the patient's care in the ED.  ____________________________________________   LABS (pertinent positives/negatives)  Labs Reviewed  BLOOD GAS, VENOUS - Abnormal; Notable for the following components:      Result Value   pO2, Ven <31.0 (*)    Bicarbonate 29.5 (*)    Acid-Base Excess 3.1 (*)    All other components within normal limits  CBC WITH DIFFERENTIAL/PLATELET - Abnormal; Notable for the following components:   WBC 18.8 (*)    RBC 2.44 (*)    Hemoglobin 7.6 (*)    HCT 23.9 (*)    RDW 15.7 (*)    nRBC 0.3 (*)    Neutro Abs 16.8 (*)    Lymphs Abs 0.6 (*)    Monocytes Absolute 1.3 (*)    Abs Immature Granulocytes 0.12 (*)    All other components within normal limits  BASIC METABOLIC PANEL - Abnormal; Notable for the following components:   Glucose, Bld 118 (*)    Creatinine, Ser 2.57 (*)    Calcium 8.4 (*)    GFR calc non Af Amer 17 (*)    GFR calc Af Amer 20 (*)    All other  components within normal limits  BRAIN NATRIURETIC PEPTIDE - Abnormal; Notable for the following components:   B Natriuretic Peptide 2,830.0 (*)    All other components within normal limits  TROPONIN I (HIGH SENSITIVITY) - Abnormal; Notable for the following components:   Troponin I (High Sensitivity) 22 (*)    All other components within normal limits  SARS CORONAVIRUS 2 (HOSPITAL ORDER, [place] [facility])   CRITICAL CARE Performed by: Laurence Aly   Total critical care time: 30 minutes  Critical care time was exclusive of separately billable procedures and treating other patients.  Critical care was necessary to treat or prevent imminent or life-threatening deterioration.  Critical care was time spent personally by me on the following activities: development of treatment plan with patient and/or surrogate as well as nursing, discussions with consultants, evaluation of patient's response to treatment,  examination of patient, obtaining history from patient or surrogate, ordering and performing treatments and interventions, ordering and review of laboratory studies, ordering and review of radiographic studies, pulse oximetry and re-evaluation of patient's condition.  RADIOLOGY Images were viewed by me  Chest x-ray IMPRESSION:  Findings most suggestive of asymmetric pulmonary edema in the RIGHT  lung.  ____________________________________________   DIFFERENTIAL DIAGNOSIS   Acute pulmonary edema, renal failure, electrolyte abnormality, MI, arrhythmia  FINAL ASSESSMENT AND PLAN  Acute respiratory distress, leukocytosis, pulmonary edema   Plan: The patient had presented for acute respiratory distress. Patient's labs did reveal significant leukocytosis and also likely evidence of volume overload in the setting of chronic renal failure on dialysis. Patient's imaging was suggestive of asymmetric pulmonary edema.  Her white count does suggest that she may  have some infection as well.  I have added IV Levaquin and discussed with nephrology who has arrange dialysis for tonight.   Laurence Aly, MD    Note: This note was generated in part or whole with voice recognition software. Voice recognition is usually quite accurate but there are transcription errors that can and very often do occur. I apologize for any typographical errors that were not detected and corrected.     Earleen Newport, MD 07/27/19 [phone]

## 2019-07-27 NOTE — ED Notes (Signed)
X-ray at bedside

## 2019-07-27 NOTE — Progress Notes (Signed)
This note also relates to the following rows which could not be included: Pulse Rate - Cannot attach notes to unvalidated device data Resp - Cannot attach notes to unvalidated device data BP - Cannot attach notes to unvalidated device data SpO2 - Cannot attach notes to unvalidated device data   

## 2019-07-27 NOTE — H&P (Signed)
[place] [facility] [place] NAME: Female Masis    MR#:  [id number]  DATE OF BIRTH:  August 07, 1941  DATE OF ADMISSION:  07/27/2019  PRIMARY CARE PHYSICIAN: Dion Body, MD   REQUESTING/REFERRING PHYSICIAN: Dr. Lenise Arena  CHIEF COMPLAINT: Shortness of breath   Chief Complaint  Patient presents with  . Shortness of Breath  . Respiratory Distress    HISTORY OF PRESENT ILLNESS:  Anita Wells  is a 78 y.o. female with a known history of essential hypertension, ESRD on hemodialysis, hyperlipidemia, renal artery stenosis came to ER because of shortness of breath.  Patient brought by EMS because of shortness of breath started on CPAP for comfort, in the ER CPAP is changed to nasal cannula to 4 L and then had a lot of blood work done including COVID-19 test.  Patient COVID-19 test is negative, chest x-ray is concerning for asymmetric edema.  Patient main complaint is shortness of breath, chills.  Did not have chest pain, cough, fever.  Patient had dialysis today but they did not pull any significant fluid because of shortness of breath and also patient not feeling well.  PAST MEDICAL HISTORY:   Past Medical History:  Diagnosis Date  . Anemia   . Barrett's esophagus   . Carotid stenosis, bilateral   . Chronic kidney disease   . GERD (gastroesophageal reflux disease)   . Hyperlipidemia   . Hyperparathyroidism ([place])   . Hypertension   . Renal artery stenosis ([facility])    left    PAST SURGICAL HISTOIRY:   Past Surgical History:  Procedure Laterality Date  . A/V FISTULAGRAM Left 02/19/2019   Procedure: A/V FISTULAGRAM;  Surgeon: Algernon Huxley, MD;  Location: [facility];  Service: Cardiovascular;  Laterality: Left;  . AV FISTULA PLACEMENT  12/21/2018   Procedure: ARTERIOVENOUS (AV) FISTULA CREATION;  Surgeon: Algernon Huxley, MD;  Location: [facility];  Service: Vascular;;  . COLON RESECTION    . COLON SURGERY    . colonoscpy    .  DIALYSIS/PERMA CATHETER INSERTION N/A 11/15/2018   Procedure: DIALYSIS/PERMA CATHETER INSERTION;  Surgeon: Algernon Huxley, MD;  Location: [facility];  Service: Cardiovascular;  Laterality: N/A;  . RENAL ARTERY STENT Left     SOCIAL HISTORY:   Social History   Tobacco Use  . Smoking status: Current Every Day Smoker    Packs/day: 0.25    Years: 50.00    Pack years: 12.50    Types: Cigarettes  . Smokeless tobacco: Never Used  Substance Use Topics  . Alcohol use: Not Currently    FAMILY HISTORY:  History reviewed. No pertinent family history.  DRUG ALLERGIES:   Allergies  Allergen Reactions  . Clopidogrel Swelling    Mouth swelling  . Ace Inhibitors Cough  . Amlodipine Other (See Comments)    Peripheral edema    REVIEW OF SYSTEMS:  CONSTITUTIONAL: No fever, has shortness of breath, chills.   EYES: No blurred or double vision.  EARS, NOSE, AND THROAT: No tinnitus or ear pain.  RESPIRATORY: Shortness of breath CARDIOVASCULAR: No chest pain, orthopnea, edema.  GASTROINTESTINAL: No nausea, vomiting, diarrhea or abdominal pain.  GENITOURINARY: No dysuria, hematuria.,  Patient is on dialysis. ENDOCRINE: No polyuria, nocturia,  HEMATOLOGY: No anemia, easy bruising or bleeding SKIN: No rash or lesion. MUSCULOSKELETAL: No joint pain or arthritis.   NEUROLOGIC: No tingling, numbness, weakness.  PSYCHIATRY: No anxiety or depression.   MEDICATIONS AT HOME:  Prior to Admission medications   Medication Sig Start Date End Date Taking? Authorizing Provider  aspirin EC 81 MG tablet Take 81 mg by mouth daily.   Yes [provider]  atorvastatin (LIPITOR) 40 MG tablet Take 40 mg by mouth at bedtime.    Yes [provider]  calcitRIOL (ROCALTROL) 0.25 MCG capsule Take 0.25 mcg by mouth every Monday, Wednesday, and Friday.    Yes [provider]  cloNIDine (CATAPRES) 0.2 MG tablet Take 0.2-0.4 mg by mouth See admin instructions. 0.2 mg every morning  and 0.4 mg at bedtime   Yes [provider]  docusate sodium (COLACE) 100 MG capsule Take 100 mg by mouth 2 (two) times daily.   Yes [provider]  escitalopram (LEXAPRO) 10 MG tablet Take 10 mg by mouth daily.   Yes [provider]  ferrous sulfate 325 (65 FE) MG tablet Take 325 mg by mouth daily with breakfast.   Yes [provider]  fluticasone (FLONASE) 50 MCG/ACT nasal spray Place 2 sprays into the nose daily.   Yes [provider]  furosemide (LASIX) 40 MG tablet Take 40 mg by mouth daily. On non-dialysis days   Yes [provider]  hydrALAZINE (APRESOLINE) 100 MG tablet Take 100 mg by mouth 3 (three) times daily.   Yes [provider]  losartan (COZAAR) 100 MG tablet Take 100 mg by mouth daily.   Yes [provider]  metoprolol tartrate (LOPRESSOR) 100 MG tablet Take 100 mg by mouth 2 (two) times daily.   Yes [provider]  mirtazapine (REMERON) 7.5 MG tablet Take 7.5 mg by mouth at bedtime.   Yes [provider]  multivitamin (RENA-VIT) TABS tablet Take 1 tablet by mouth every Monday, Wednesday, and Friday.    Yes [provider]  omeprazole (PRILOSEC) 40 MG capsule Take 40 mg by mouth daily.   Yes [provider]  sodium bicarbonate 325 MG tablet Take 325 mg by mouth 2 (two) times daily.   Yes [provider]  torsemide (DEMADEX) 10 MG tablet Take 10 mg by mouth daily. May take 1 additional tablet as needed for swelling   Yes [provider]      VITAL SIGNS:  Blood pressure (!) 174/54, pulse 68, temperature 98.3 F (36.8 C), temperature source Oral, resp. rate (!) 21, height 5\' 6"  (1.676 m), weight 56.7 kg, SpO2 100 %.  PHYSICAL EXAMINATION:  GENERAL:  78 y.o.-year-old patient lying in the bed with no acute distress.  old and fragile female complains of chills. EYES: Pupils equal, round, reactive to light   No scleral icterus. Extraocular muscles intact.   HEENT: Head atraumatic, normocephalic. Oropharynx and nasopharynx clear.  NECK:  Supple, no jugular venous distention. No thyroid enlargement, no tenderness.  LUNGS: Normal breath sounds bilaterally, no wheezing, rales,rhonchi or crepitation. No use of accessory muscles of respiration.  CARDIOVASCULAR: S1, S2 normal. No murmurs, rubs, or gallops.  ABDOMEN: Soft, nontender, nondistended. Bowel sounds present. No organomegaly or mass.  Patient has peritoneal dialysis catheter in lower abdomen without evidence of infection.  Site looks clean.  No erythema, tenderness. EXTREMITIES: No pedal edema, cyanosis, or clubbing.  NEUROLOGIC: Cranial nerves II through XII are intact. Muscle strength 5/5 in all extremities. Sensation intact. Gait not checked.  PSYCHIATRIC: The patient is alert and oriented x 3.  SKIN: No obvious rash, lesion, or ulcer.   LABORATORY PANEL:   CBC Recent Labs  Lab 07/27/19 1653  WBC 18.8*  HGB 7.6*  HCT 23.9*  PLT 225   ------------------------------------------------------------------------------------------------------------------  Chemistries  Recent Labs  Lab 07/27/19 1653  NA 138  K 3.5  CL 99  CO2 28  GLUCOSE 118*  BUN 19  CREATININE 2.57*  CALCIUM 8.4*   ------------------------------------------------------------------------------------------------------------------  Cardiac Enzymes No results for input(s): TROPONINI in the last 168 hours. ------------------------------------------------------------------------------------------------------------------  RADIOLOGY:  Dg Chest Port 1 View  Result Date: 07/27/2019 CLINICAL DATA:  Pt came from dialysis today, pt does peritoneal dialysis but unable to draw fluid off, was found to be SOB by staff. dyspnea EXAM: PORTABLE CHEST 1 VIEW COMPARISON:  Radiograph 04/04/2011 FINDINGS: Large-bore RIGHT central venous line tips in the RIGHT atrium. Normal cardiac silhouette. There is asymmetric diffuse airspace  disease in the RIGHT upper lobe and to a lesser degree in the RIGHT lower lobe. Nipple shadows noted. No focal consolidation. No pneumothorax. Lungs are hyperinflated. IMPRESSION: Findings most suggestive of asymmetric pulmonary edema in the RIGHT lung. Electronically Signed   By: Suzy Bouchard M.D.   On: 07/27/2019 17:09    EKG:   Orders placed or performed during the hospital encounter of 07/27/19  . ED EKG  . ED EKG  Normal sinus rhythm 70 bpm, LVH, long QT, anterior Q waves, normal axis.  IMPRESSION AND PLAN:   78 year old female with history of ESRD on hemodialysis Monday, Wednesday, Friday, history of GERD, hyperlipidemia, hypertension, renal artery stenosis, Barrett's esophagus, carotid stenosis bilaterally, chronic anemia comes in because of shortness of breath, chills.  #1 acute respiratory failure with hypoxia likely secondary to fluid overload and not getting for hemodialysis today, admit to hospitalist service, nephrology is consulted to arrange for dialysis again today, initially CPAP was started by EMS for comfort and now on 4 L of oxygen sats are 100%.  Wean off oxygen as tolerated. 2.  Leukocytosis, chills, check lactic acid, procalcitonin at this time, empiric antibiotics are started secondary to her immunosuppression with her hemodialysis and elderly female with a PD catheter in place, check CBC and follow the trend for leukocytosis.  Received a dose of Levaquin in the emergency room.  #3/anemia of chronic kidney disease, hemoglobin 7.6.  Hold off on transfusion and monitor closely. Elevated BNP secondary to hemodialysis.  All the records are reviewed and case discussed with ED provider. Management plans discussed with the patient, family and they are in agreement.  CODE STATUS: Full code  TOTAL TIME TAKING CARE OF THIS PATIENT: 55 minutes.  Discussed with daughter.  Epifanio Lesches M.D on 07/27/2019 at 6:37 PM  Between 7am to 6pm - Pager - [phone]  After  6pm go to [email or web address] - password [email or web address] [place]  Office  [phone]  CC: Primary care physician; Dion Body, MD  Note: This dictation was prepared with Dragon dictation along with smaller phrase technology. Any transcriptional errors that result from this process are unintentional.

## 2019-07-27 NOTE — ED Notes (Signed)
Patient transported to dialysis

## 2019-07-27 NOTE — ED Triage Notes (Signed)
Pt to ED via EMS from dialysis today, pt does peritoneal dialysis but unable to draw fluid off, was found to be SOB by staff.  Pt was 92% 2L [place] with EMS and placed on CPAP, difficulty speaking sentences and bilat new ankle swelling.  EMS vitals 167 CBG, 98.7 temp, 80 HR.  Presents A&Ox4

## 2019-07-27 NOTE — Progress Notes (Signed)
   07/27/19 2200  Vital Signs  Temp 97.8 F (36.6 C)  Temp Source Oral  Pulse Rate 67  Pulse Rate Source Monitor  Resp (!) 31  BP 126/65  BP Location Right Arm  BP Method Automatic  Patient Position (if appropriate) Sitting  Oxygen Therapy  SpO2 94 %  Pt stable tolerated tx well ended Seq tx 25 mins early due to cramping vitals stable pulled 2571ml via CVC WDL

## 2019-07-27 NOTE — Progress Notes (Signed)
   07/27/19 1900  Neurological  Level of Consciousness Alert  Orientation Level Oriented X4  Respiratory  Respiratory Pattern Regular;Labored;Dyspnea at rest  Bilateral Breath Sounds Clear;Diminished  Cardiac  Pulse Regular  ECG Monitor Yes  Pt arrived to unit at 1855 via [facility] with ED transport, c/o SOB no other c/os

## 2019-07-27 NOTE — Progress Notes (Signed)
[facility]  ROUNDING NOTE   Subjective:   [name] admitted to [facility] on 07/27/2019 for Acute pulmonary edema ([place]) [J81.0] Hypoxia [R09.02] Leukocytosis, unspecified type [D72.829]  Patient currently undergoing peritoneal dialysis training. However she has developed significant edema, so she was then scheduled for back up hemodialysis today. She had a full hemodialysis treatment with ultrafiltration of 1.6 liters removed. Ultrafiltration was limited due to hypotension, patient took her home blood pressure medications this morning.   Patient developed acute respiratory distress and was brought to the ED. Placed on CPAP, now weaned down to 4.5 liters oxygen.   Brought down to dialysis for urgent dialysis treatment. Sequential only. Uf goal of 3 .liters.       Objective:  Vital signs in last 24 hours:  Temp:  [98.3 F (36.8 C)] 98.3 F (36.8 C) (10/02 1654) Pulse Rate:  [67-70] 68 (10/02 1830) Resp:  [20-28] 26 (10/02 1830) BP: (160-174)/(46-83) 162/50 (10/02 1830) SpO2:  [98 %-100 %] 98 % (10/02 1830) Weight:  [56.7 kg] 56.7 kg (10/02 1655)  Weight change:  Filed Weights   07/27/19 1655  Weight: 56.7 kg    Intake/Output: No intake/output data recorded.   Intake/Output this shift:  No intake/output data recorded.  Physical Exam: General: NAD, sitting in chair  Head: Normocephalic, atraumatic. Moist oral mucosal membranes  Eyes: Anicteric, PERRL  Neck: Supple, trachea midline  Lungs:  Bilateral crackles, R > L , 4L [place] O2  Heart: Regular rate and rhythm  Abdomen:  Soft, nontender,   Extremities: ++ peripheral edema.  Neurologic: Nonfocal, moving all four extremities  Skin: No lesions  Access: RIJ permcath, Left AVF maturing, PD catheter    Basic Metabolic Panel: Recent Labs  Lab 07/27/19 1653  NA 138  K 3.5  CL 99  CO2 28  GLUCOSE 118*  BUN 19  CREATININE 2.57*  CALCIUM 8.4*    Liver Function Tests: No results for input(s): AST,  ALT, ALKPHOS, BILITOT, PROT, ALBUMIN in the last 168 hours. No results for input(s): LIPASE, AMYLASE in the last 168 hours. No results for input(s): AMMONIA in the last 168 hours.  CBC: Recent Labs  Lab 07/27/19 1653  WBC 18.8*  NEUTROABS 16.8*  HGB 7.6*  HCT 23.9*  MCV 98.0  PLT 225    Cardiac Enzymes: No results for input(s): CKTOTAL, CKMB, CKMBINDEX, TROPONINI in the last 168 hours.  BNP: Invalid input(s): POCBNP  CBG: No results for input(s): GLUCAP in the last 168 hours.  Microbiology: Results for orders placed or performed during the hospital encounter of 07/27/19  SARS Coronavirus 2 [facility] order, Performed in [facility] hospital lab) Nasopharyngeal Nasopharyngeal Swab     Status: None   Collection Time: 07/27/19  4:53 PM   Specimen: Nasopharyngeal Swab  Result Value Ref Range Status   SARS Coronavirus 2 NEGATIVE NEGATIVE Final    Comment: (NOTE) If result is NEGATIVE SARS-CoV-2 target nucleic acids are NOT DETECTED. The SARS-CoV-2 RNA is generally detectable in upper and lower  respiratory specimens during the acute phase of infection. The lowest  concentration of SARS-CoV-2 viral copies this assay can detect is 250  copies / mL. A negative result does not preclude SARS-CoV-2 infection  and should not be used as the sole basis for treatment or other  patient management decisions.  A negative result may occur with  improper specimen collection / handling, submission of specimen other  than nasopharyngeal swab, presence of viral mutation(s) within the  areas  targeted by this assay, and inadequate number of viral copies  (<250 copies / mL). A negative result must be combined with clinical  observations, patient history, and epidemiological information. If result is POSITIVE SARS-CoV-2 target nucleic acids are DETECTED. The SARS-CoV-2 RNA is generally detectable in upper and lower  respiratory specimens dur ing the acute phase of infection.  Positive  results  are indicative of active infection with SARS-CoV-2.  Clinical  correlation with patient history and other diagnostic information is  necessary to determine patient infection status.  Positive results do  not rule out bacterial infection or co-infection with other viruses. If result is PRESUMPTIVE POSTIVE SARS-CoV-2 nucleic acids MAY BE PRESENT.   A presumptive positive result was obtained on the submitted specimen  and confirmed on repeat testing.  While 2019 novel coronavirus  (SARS-CoV-2) nucleic acids may be present in the submitted sample  additional confirmatory testing may be necessary for epidemiological  and / or clinical management purposes  to differentiate between  SARS-CoV-2 and other Sarbecovirus currently known to infect humans.  If clinically indicated additional testing with an alternate test  methodology [phone]) is advised. The SARS-CoV-2 RNA is generally  detectable in upper and lower respiratory sp ecimens during the acute  phase of infection. The expected result is Negative. Fact Sheet for Patients:  StrictlyIdeas.no Fact Sheet for Healthcare Providers: BankingDealers.co.za This test is not yet approved or cleared by the [place] FDA and has been authorized for detection and/or diagnosis of SARS-CoV-2 by FDA under an Emergency Use Authorization (EUA).  This EUA will remain in effect (meaning this test can be used) for the duration of the COVID-19 declaration under Section 564(b)(1) of the Act, 21 U.S.C. section 360bbb-3(b)(1), unless the authorization is terminated or revoked sooner. Performed at [facility], [address] [place], [place] [address]     Coagulation Studies: No results for input(s): LABPROT, INR in the last 72 hours.  Urinalysis: No results for input(s): COLORURINE, LABSPEC, PHURINE, GLUCOSEU, HGBUR, BILIRUBINUR, KETONESUR, PROTEINUR, UROBILINOGEN, NITRITE, LEUKOCYTESUR in the  last 72 hours.  Invalid input(s): APPERANCEUR    Imaging: Dg Chest Port 1 View  Result Date: 07/27/2019 CLINICAL DATA:  Pt came from dialysis today, pt does peritoneal dialysis but unable to draw fluid off, was found to be SOB by staff. dyspnea EXAM: PORTABLE CHEST 1 VIEW COMPARISON:  Radiograph 04/04/2011 FINDINGS: Large-bore RIGHT central venous line tips in the RIGHT atrium. Normal cardiac silhouette. There is asymmetric diffuse airspace disease in the RIGHT upper lobe and to a lesser degree in the RIGHT lower lobe. Nipple shadows noted. No focal consolidation. No pneumothorax. Lungs are hyperinflated. IMPRESSION: Findings most suggestive of asymmetric pulmonary edema in the RIGHT lung. Electronically Signed   By: Suzy Bouchard M.D.   On: 07/27/2019 17:09     Medications:    . [START ON 07/28/2019] Chlorhexidine Gluconate Cloth  6 each Topical Q0600     Assessment/ Plan:  [name] is a 78 y.o. black female with end stage renal disease on hemodialysis, hypertension, renal artery stenosis, GERD, hyperlipidemia who presents to [facility] ED with Acute pulmonary edema ([place]) [J81.0] Hypoxia [R09.02] Leukocytosis, unspecified type [D72.829]  1. End Stage Renale Disease Currently getting peritoneal dialysis training. Underwent back up hemodialysis today.  However continues to have fluid overload - Second hemodialysis treatment today. Sequential UF only with UF goal of 3 liters.  - Evaluate daily for dialysis need. Suspect she will need dialysis again tomorrow.   2.  Hypertension: hypotensive at outpatient dialysis due to patient taking her medications this morning. Currently blood pressure is acceptable.   3. Anemia of chronic kidney disease: hemoglobin 7.6  4. Secondary Hyperparathyroidism:  - not currently on binders - calcitriol ordered  5. Acute respiratory failure: with right sided pulmonary edema. Requiring oxygen.  Also with leukocytosis - Empiric levofloxacin    LOS:  0 Leonte Horrigan 10/2/20208:23 PM

## 2019-07-28 LAB — CBC
HCT: 21.4 % — ABNORMAL LOW (ref 36.0–46.0)
Hemoglobin: 6.8 g/dL — ABNORMAL LOW (ref 12.0–15.0)
MCH: 31.3 pg (ref 26.0–34.0)
MCHC: 31.8 g/dL (ref 30.0–36.0)
MCV: 98.6 fL (ref 80.0–100.0)
Platelets: 186 10*3/uL (ref 150–400)
RBC: 2.17 MIL/uL — ABNORMAL LOW (ref 3.87–5.11)
RDW: 15.6 % — ABNORMAL HIGH (ref 11.5–15.5)
WBC: 12.4 10*3/uL — ABNORMAL HIGH (ref 4.0–10.5)
nRBC: 0.3 % — ABNORMAL HIGH (ref 0.0–0.2)

## 2019-07-28 LAB — PROCALCITONIN
Procalcitonin: 2.31 ng/mL
Procalcitonin: 2.84 ng/mL

## 2019-07-28 LAB — IRON AND TIBC
Iron: 18 ug/dL — ABNORMAL LOW (ref 28–170)
Saturation Ratios: 8 % — ABNORMAL LOW (ref 10.4–31.8)
TIBC: 218 ug/dL — ABNORMAL LOW (ref 250–450)
UIBC: 200 ug/dL

## 2019-07-28 LAB — BASIC METABOLIC PANEL
Anion gap: 10 (ref 5–15)
BUN: 26 mg/dL — ABNORMAL HIGH (ref 8–23)
CO2: 26 mmol/L (ref 22–32)
Calcium: 8.1 mg/dL — ABNORMAL LOW (ref 8.9–10.3)
Chloride: 101 mmol/L (ref 98–111)
Creatinine, Ser: 3.58 mg/dL — ABNORMAL HIGH (ref 0.44–1.00)
GFR calc Af Amer: 13 mL/min — ABNORMAL LOW (ref >60)
GFR calc non Af Amer: 11 mL/min — ABNORMAL LOW (ref >60)
Glucose, Bld: 121 mg/dL — ABNORMAL HIGH (ref 70–99)
Potassium: 3.1 mmol/L — ABNORMAL LOW (ref 3.5–5.1)
Sodium: 137 mmol/L (ref 135–145)

## 2019-07-28 LAB — RETICULOCYTES
Immature Retic Fract: 32.1 % — ABNORMAL HIGH (ref 2.3–15.9)
RBC.: 2.15 MIL/uL — ABNORMAL LOW (ref 3.87–5.11)
Retic Count, Absolute: 109.7 10*3/uL (ref 19.0–186.0)
Retic Ct Pct: 5.1 % — ABNORMAL HIGH (ref 0.4–3.1)

## 2019-07-28 LAB — MRSA PCR SCREENING: MRSA by PCR: NEGATIVE

## 2019-07-28 LAB — FOLATE: Folate: 35 ng/mL (ref >5.9)

## 2019-07-28 LAB — PREPARE RBC (CROSSMATCH)

## 2019-07-28 LAB — FERRITIN: Ferritin: 231 ng/mL (ref 11–307)

## 2019-07-28 LAB — VITAMIN B12: Vitamin B-12: 820 pg/mL (ref 180–914)

## 2019-07-28 LAB — GLUCOSE, CAPILLARY: Glucose-Capillary: 129 mg/dL — ABNORMAL HIGH (ref 70–99)

## 2019-07-28 LAB — LACTIC ACID, PLASMA: Lactic Acid, Venous: 1.4 mmol/L (ref 0.5–1.9)

## 2019-07-28 MED ORDER — EPOETIN ALFA 10000 UNIT/ML IJ SOLN
10000.0000 [IU] | Freq: Once | INTRAMUSCULAR | Status: AC
  Administered 2019-07-28: 10000 [IU] via INTRAVENOUS

## 2019-07-28 MED ORDER — SODIUM CHLORIDE 0.9% IV SOLUTION: Freq: Once | INTRAVENOUS | Status: DC

## 2019-07-28 NOTE — Progress Notes (Signed)
[facility]  ROUNDING NOTE   Subjective:   Extra hemodialysis treatment last night. UF of 2.8 liters. Patient started to cramp after dialysis.   Brought down for another treatment. UF goal of 2.5 liters. However starting to have cramps.  Scheduled PRBC transfusion for today.   Objective:  Vital signs in last 24 hours:  Temp:  [97.4 F (36.3 C)-98.3 F (36.8 C)] 98 F (36.7 C) (10/03 1010) Pulse Rate:  [57-72] 61 (10/03 1010) Resp:  [16-31] 18 (10/03 0353) BP: (110-195)/(43-89) 113/43 (10/03 1010) SpO2:  [89 %-100 %] 98 % (10/03 1010) Weight:  [52.9 kg-56.7 kg] 52.9 kg (10/02 2253)  Weight change:  Filed Weights   07/27/19 1655 07/27/19 2253  Weight: 56.7 kg 52.9 kg    Intake/Output: I/O last 3 completed shifts: In: 135.1 [IV Piggyback:135.1] Out: 2088 [Other:2088]   Intake/Output this shift:  No intake/output data recorded.  Physical Exam: General: NAD, sitting in chair  Head: Normocephalic, atraumatic. Moist oral mucosal membranes  Eyes: Anicteric, PERRL  Neck: Supple, trachea midline  Lungs:  Bilateral crackles, R > L , 2L [place] O2  Heart: Regular rate and rhythm  Abdomen:  Soft, nontender,   Extremities: + peripheral edema.  Neurologic: Nonfocal, moving all four extremities  Skin: No lesions  Access: RIJ permcath, Left AVF maturing, PD catheter    Basic Metabolic Panel: Recent Labs  Lab 07/27/19 1653 07/28/19 0423  NA 138 137  K 3.5 3.1*  CL 99 101  CO2 28 26  GLUCOSE 118* 121*  BUN 19 26*  CREATININE 2.57* 3.58*  CALCIUM 8.4* 8.1*    Liver Function Tests: No results for input(s): AST, ALT, ALKPHOS, BILITOT, PROT, ALBUMIN in the last 168 hours. No results for input(s): LIPASE, AMYLASE in the last 168 hours. No results for input(s): AMMONIA in the last 168 hours.  CBC: Recent Labs  Lab 07/27/19 1653 07/28/19 0423  WBC 18.8* 12.4*  NEUTROABS 16.8*  --   HGB 7.6* 6.8*  HCT 23.9* 21.4*  MCV 98.0 98.6  PLT 225 186    Cardiac  Enzymes: No results for input(s): CKTOTAL, CKMB, CKMBINDEX, TROPONINI in the last 168 hours.  BNP: Invalid input(s): POCBNP  CBG: Recent Labs  Lab 07/28/19 0749  GLUCAP 129*    Microbiology: Results for orders placed or performed during the hospital encounter of 07/27/19  SARS Coronavirus 2 [facility] order, Performed in [facility] hospital lab) Nasopharyngeal Nasopharyngeal Swab     Status: None   Collection Time: 07/27/19  4:53 PM   Specimen: Nasopharyngeal Swab  Result Value Ref Range Status   SARS Coronavirus 2 NEGATIVE NEGATIVE Final    Comment: (NOTE) If result is NEGATIVE SARS-CoV-2 target nucleic acids are NOT DETECTED. The SARS-CoV-2 RNA is generally detectable in upper and lower  respiratory specimens during the acute phase of infection. The lowest  concentration of SARS-CoV-2 viral copies this assay can detect is 250  copies / mL. A negative result does not preclude SARS-CoV-2 infection  and should not be used as the sole basis for treatment or other  patient management decisions.  A negative result may occur with  improper specimen collection / handling, submission of specimen other  than nasopharyngeal swab, presence of viral mutation(s) within the  areas targeted by this assay, and inadequate number of viral copies  (<250 copies / mL). A negative result must be combined with clinical  observations, patient history, and epidemiological information. If result is POSITIVE SARS-CoV-2 target nucleic acids are DETECTED.  The SARS-CoV-2 RNA is generally detectable in upper and lower  respiratory specimens dur ing the acute phase of infection.  Positive  results are indicative of active infection with SARS-CoV-2.  Clinical  correlation with patient history and other diagnostic information is  necessary to determine patient infection status.  Positive results do  not rule out bacterial infection or co-infection with other viruses. If result is PRESUMPTIVE  POSTIVE SARS-CoV-2 nucleic acids MAY BE PRESENT.   A presumptive positive result was obtained on the submitted specimen  and confirmed on repeat testing.  While 2019 novel coronavirus  (SARS-CoV-2) nucleic acids may be present in the submitted sample  additional confirmatory testing may be necessary for epidemiological  and / or clinical management purposes  to differentiate between  SARS-CoV-2 and other Sarbecovirus currently known to infect humans.  If clinically indicated additional testing with an alternate test  methodology [phone]) is advised. The SARS-CoV-2 RNA is generally  detectable in upper and lower respiratory sp ecimens during the acute  phase of infection. The expected result is Negative. Fact Sheet for Patients:  StrictlyIdeas.no Fact Sheet for Healthcare Providers: BankingDealers.co.za This test is not yet approved or cleared by the [place] FDA and has been authorized for detection and/or diagnosis of SARS-CoV-2 by FDA under an Emergency Use Authorization (EUA).  This EUA will remain in effect (meaning this test can be used) for the duration of the COVID-19 declaration under Section 564(b)(1) of the Act, 21 U.S.C. section 360bbb-3(b)(1), unless the authorization is terminated or revoked sooner. Performed at [facility], [address] [place], [place] [address]   MRSA PCR Screening     Status: None   Collection Time: 07/27/19 11:19 PM   Specimen: Nasal Mucosa; Nasopharyngeal  Result Value Ref Range Status   MRSA by PCR NEGATIVE NEGATIVE Final    Comment:        The GeneXpert MRSA Assay (FDA approved for NASAL specimens only), is one component of a comprehensive MRSA colonization surveillance program. It is not intended to diagnose MRSA infection nor to guide or monitor treatment for MRSA infections. Performed at [facility], [address] [place], [place] [address]      Coagulation Studies: No results for input(s): LABPROT, INR in the last 72 hours.  Urinalysis: No results for input(s): COLORURINE, LABSPEC, PHURINE, GLUCOSEU, HGBUR, BILIRUBINUR, KETONESUR, PROTEINUR, UROBILINOGEN, NITRITE, LEUKOCYTESUR in the last 72 hours.  Invalid input(s): APPERANCEUR    Imaging: Dg Chest Port 1 View  Result Date: 07/27/2019 CLINICAL DATA:  Pt came from dialysis today, pt does peritoneal dialysis but unable to draw fluid off, was found to be SOB by staff. dyspnea EXAM: PORTABLE CHEST 1 VIEW COMPARISON:  Radiograph 04/04/2011 FINDINGS: Large-bore RIGHT central venous line tips in the RIGHT atrium. Normal cardiac silhouette. There is asymmetric diffuse airspace disease in the RIGHT upper lobe and to a lesser degree in the RIGHT lower lobe. Nipple shadows noted. No focal consolidation. No pneumothorax. Lungs are hyperinflated. IMPRESSION: Findings most suggestive of asymmetric pulmonary edema in the RIGHT lung. Electronically Signed   By: Suzy Bouchard M.D.   On: 07/27/2019 17:09     Medications:    . sodium chloride   Intravenous Once  . aspirin EC  81 mg Oral Daily  . atorvastatin  40 mg Oral q1800  . [START ON 07/30/2019] calcitRIOL  0.25 mcg Oral Once per day on Mon Wed Fri  . Chlorhexidine Gluconate Cloth  6 each Topical Q0600  . cloNIDine  0.1 mg Oral Daily  . cloNIDine  0.2 mg Oral QHS  . docusate sodium  100 mg Oral BID  . epoetin (EPOGEN/PROCRIT) injection  10,000 Units Intravenous Once  . escitalopram  10 mg Oral Daily  . ferrous sulfate  325 mg Oral Q breakfast  . furosemide  80 mg Oral Daily  . heparin  5,000 Units Subcutaneous Q8H  . hydrALAZINE  100 mg Oral TID  . losartan  100 mg Oral Daily  . metoprolol tartrate  100 mg Oral BID  . mirtazapine  7.5 mg Oral QHS  . [START ON 07/30/2019] multivitamin  1 tablet Oral 3 times weekly  . sodium bicarbonate  325 mg Oral BID  . torsemide  10 mg Oral Daily     Assessment/ Plan:  [name] is a 78 y.o. black female with end stage renal disease on hemodialysis, hypertension, renal artery stenosis, GERD, hyperlipidemia who presents to [facility] ED with Acute pulmonary edema ([place]) [J81.0] Hypoxia [R09.02] Leukocytosis, unspecified type [D72.829]  1. End Stage Renale Disease: seen and examined on hemodialysis treatment.  Currently getting peritoneal dialysis training.   Goal UF of 2.5 liters - Evaluate daily for dialysis need.   - discontinue sodium bicarbonate.   2. Hypertension: blood pressure at goal. Holding furosemide, hydralazine, losartan, metoprolol, and torsemide.  3. Anemia of chronic kidney disease: hemoglobin 6.8 - EPO with HD treatment today - PRBC 1 unit ordered for today.   4. Secondary Hyperparathyroidism: outpatient labs from 10/1 with PTH 382, phosphorus 4.7 - not currently on binders - calcitriol on hold  5. Acute respiratory failure: with right sided pulmonary edema. Requiring oxygen.  Also with leukocytosis - Empiric levofloxacin    LOS: 1 Mavi Un 10/3/202011:01 AM

## 2019-07-28 NOTE — Progress Notes (Signed)
Hd started  

## 2019-07-28 NOTE — Progress Notes (Signed)
This note also relates to the following rows which could not be included: Pulse Rate - Cannot attach notes to unvalidated device data Resp - Cannot attach notes to unvalidated device data  Hd completed  

## 2019-07-28 NOTE — Progress Notes (Signed)
[place] [facility] [place] NAME: Anita Wells    MR#:  [id number]  DATE OF BIRTH:  04-07-1941  SUBJECTIVE:  CHIEF COMPLAINT:   Chief Complaint  Patient presents with  . Shortness of Breath  . Respiratory Distress   -Received hemodialysis twice yesterday and almost 6 L ultrafiltration, fluid removed.  Still has some bibasilar crackles and acutely needing 2 L oxygen. -Drop in hemoglobin noted to 6.8  REVIEW OF SYSTEMS:  Review of Systems  Constitutional: Positive for malaise/fatigue. Negative for chills and fever.  HENT: Negative for congestion, ear discharge, hearing loss and nosebleeds.   Eyes: Negative for blurred vision and double vision.  Respiratory: Positive for shortness of breath. Negative for cough and wheezing.   Cardiovascular: Negative for chest pain and palpitations.  Gastrointestinal: Negative for abdominal pain, constipation, diarrhea, nausea and vomiting.  Genitourinary: Negative for dysuria.  Musculoskeletal: Negative for myalgias.  Neurological: Negative for dizziness, focal weakness, seizures and headaches.  Psychiatric/Behavioral: Negative for depression.    DRUG ALLERGIES:   Allergies  Allergen Reactions  . Clopidogrel Swelling    Mouth swelling  . Ace Inhibitors Cough  . Amlodipine Other (See Comments)    Peripheral edema    VITALS:  Blood pressure (!) 113/43, pulse 61, temperature 98 F (36.7 C), temperature source Oral, resp. rate 18, height 5\' 6"  (1.676 m), weight 52.9 kg, SpO2 98 %.  PHYSICAL EXAMINATION:  Physical Exam   GENERAL:  78 y.o.-year-old patient lying in the bed with no acute distress.  EYES: Pupils equal, round, reactive to light and accommodation. No scleral icterus. Extraocular muscles intact.  HEENT: Head atraumatic, normocephalic. Oropharynx and nasopharynx clear.  NECK:  Supple, no jugular venous distention. No thyroid enlargement, no tenderness.  LUNGS: Normal breath sounds  bilaterally, no wheezing,  rhonchi or crepitation. No use of accessory muscles of respiration.  Fine bibasilar crackles heard. CARDIOVASCULAR: S1, S2 normal. No  rubs, or gallops.  2/6 systolic murmur in place ABDOMEN: Soft, nontender, nondistended. Bowel sounds present. No organomegaly or mass.  Peritoneal dialysis catheter in place EXTREMITIES: No pedal edema, cyanosis, or clubbing.  NEUROLOGIC: Cranial nerves II through XII are intact. Muscle strength 5/5 in all extremities. Sensation intact. Gait not checked.  PSYCHIATRIC: The patient is alert and oriented x 3.  Intermittent confusion noted. SKIN: No obvious rash, lesion, or ulcer. Right chest permacath in place.  Nonfunctioning left cubital fossa AV fistula present.   LABORATORY PANEL:   CBC Recent Labs  Lab 07/28/19 0423  WBC 12.4*  HGB 6.8*  HCT 21.4*  PLT 186   ------------------------------------------------------------------------------------------------------------------  Chemistries  Recent Labs  Lab 07/28/19 0423  NA 137  K 3.1*  CL 101  CO2 26  GLUCOSE 121*  BUN 26*  CREATININE 3.58*  CALCIUM 8.1*   ------------------------------------------------------------------------------------------------------------------  Cardiac Enzymes No results for input(s): TROPONINI in the last 168 hours. ------------------------------------------------------------------------------------------------------------------  RADIOLOGY:  Dg Chest Port 1 View  Result Date: 07/27/2019 CLINICAL DATA:  Pt came from dialysis today, pt does peritoneal dialysis but unable to draw fluid off, was found to be SOB by staff. dyspnea EXAM: PORTABLE CHEST 1 VIEW COMPARISON:  Radiograph 04/04/2011 FINDINGS: Large-bore RIGHT central venous line tips in the RIGHT atrium. Normal cardiac silhouette. There is asymmetric diffuse airspace disease in the RIGHT upper lobe and to a lesser degree in the RIGHT lower lobe. Nipple shadows noted. No focal  consolidation. No pneumothorax. Lungs are hyperinflated. IMPRESSION: Findings most suggestive of asymmetric  pulmonary edema in the RIGHT lung. Electronically Signed   By: Suzy Bouchard M.D.   On: 07/27/2019 17:09    EKG:   Orders placed or performed during the hospital encounter of 07/27/19  . ED EKG  . ED EKG    ASSESSMENT AND PLAN:   78 year old female with past medical history significant for end-stage renal disease, just started on peritoneal dialysis, hypertension, renal artery stenosis presents to hospital secondary to shortness of breath  1.  Acute hypoxic respiratory failure-secondary to acute pulmonary edema -Acutely needing 4 L oxygen on admission, currently on 2 L -Chest x-ray on admission showing pulmonary edema and possibly an infiltrate -Patient recently started on peritoneal dialysis and has been getting hemodialysis PRN.  Received dialysis yesterday for shortness of breath as outpatient, and received emergency dialysis on admission. -Getting another dialysis today for more fluid removal. -Wean off oxygen as tolerated. -We will dose Levaquin for pneumonia.  2.  End-stage renal disease-was on hemodialysis, has right chest permacath. -In PD training, has a peritoneal dialysis catheter in place.  Receiving hemodialysis as needed as outpatient  3.  Acute on chronic anemia-anemia of chronic disease, slow drop.  No active bleeding -Hemoglobin at 6.8.  Will receive 1 unit packed RBC transfusion with dialysis today. -Continue iron supplements  4.  Depression-on low-dose Remeron and Lexapro  5.  DVT prophylaxis-subcutaneous heparin  Patient is independent at baseline Anticipate discharge in 1 to 2 days     All the records are reviewed and case discussed with Care Management/Social Workerr. Management plans discussed with the patient, family and they are in agreement.  CODE STATUS: Full Code  TOTAL TIME TAKING CARE OF THIS PATIENT: 38 minutes.   POSSIBLE D/C IN  1-2 DAYS, DEPENDING ON CLINICAL CONDITION.   Gladstone Lighter M.D on 07/28/2019 at 11:04 AM  Between 7am to 6pm - Pager - [phone]  After 6pm go to [email or web address] - password [email or web address] [facility]  Office  [phone]  CC: Primary care physician; Dion Body, MD

## 2019-07-28 NOTE — Progress Notes (Signed)
   07/28/19 1600  Clinical Encounter Type  Visited With Patient  Visit Type Follow-up;Spiritual support  Referral From Nurse  Spiritual Encounters  Spiritual Needs Prayer

## 2019-07-29 ENCOUNTER — Inpatient Hospital Stay: Payer: Medicare Other

## 2019-07-29 LAB — BPAM RBC
Blood Product Expiration Date: 202010282359
ISSUE DATE / TIME: 202010031134
Unit Type and Rh: 7300

## 2019-07-29 LAB — TYPE AND SCREEN
ABO/RH(D): B POS
Antibody Screen: NEGATIVE
Unit division: 0

## 2019-07-29 LAB — CBC
HCT: 26.8 % — ABNORMAL LOW (ref 36.0–46.0)
Hemoglobin: 8.5 g/dL — ABNORMAL LOW (ref 12.0–15.0)
MCH: 31 pg (ref 26.0–34.0)
MCHC: 31.7 g/dL (ref 30.0–36.0)
MCV: 97.8 fL (ref 80.0–100.0)
Platelets: 191 10*3/uL (ref 150–400)
RBC: 2.74 MIL/uL — ABNORMAL LOW (ref 3.87–5.11)
RDW: 16.2 % — ABNORMAL HIGH (ref 11.5–15.5)
WBC: 8.2 10*3/uL (ref 4.0–10.5)
nRBC: 0.7 % — ABNORMAL HIGH (ref 0.0–0.2)

## 2019-07-29 LAB — BASIC METABOLIC PANEL
Anion gap: 8 (ref 5–15)
BUN: 17 mg/dL (ref 8–23)
CO2: 33 mmol/L — ABNORMAL HIGH (ref 22–32)
Calcium: 8.2 mg/dL — ABNORMAL LOW (ref 8.9–10.3)
Chloride: 98 mmol/L (ref 98–111)
Creatinine, Ser: 3.35 mg/dL — ABNORMAL HIGH (ref 0.44–1.00)
GFR calc Af Amer: 14 mL/min — ABNORMAL LOW (ref >60)
GFR calc non Af Amer: 12 mL/min — ABNORMAL LOW (ref >60)
Glucose, Bld: 146 mg/dL — ABNORMAL HIGH (ref 70–99)
Potassium: 3.5 mmol/L (ref 3.5–5.1)
Sodium: 139 mmol/L (ref 135–145)

## 2019-07-29 LAB — GLUCOSE, CAPILLARY: Glucose-Capillary: 144 mg/dL — ABNORMAL HIGH (ref 70–99)

## 2019-07-29 LAB — PROCALCITONIN: Procalcitonin: 3.01 ng/mL

## 2019-07-29 MED ORDER — LEVOFLOXACIN 500 MG PO TABS: 500.0000 mg | ORAL_TABLET | ORAL | 0 refills | Status: DC

## 2019-07-29 MED ORDER — LEVOFLOXACIN 500 MG PO TABS
500.0000 mg | ORAL_TABLET | Freq: Once | ORAL | Status: AC
  Administered 2019-07-29: 18:00:00 500 mg via ORAL
  Filled 2019-07-29: qty 1

## 2019-07-29 NOTE — Progress Notes (Signed)
SATURATION QUALIFICATIONS: (This note is used to comply with regulatory documentation for home oxygen)  Patient Saturations on Room Air at Rest = 96%  Patient Saturations on Room Air while Ambulating = 81%  Patient Saturations on 2 Liters of oxygen while Ambulating = 92%  Please briefly explain why patient needs home oxygen: Pt needs PRN O2 for home r/t patient desaturating while ambulating. Patient unable to maintain O2 sats while ambulating.

## 2019-07-29 NOTE — TOC Progression Note (Signed)
Transition of Care [facility]) - Progression Note    Patient Details  Name: Anita Wells MRN: [id number] Date of Birth: 09-Aug-1941  [facility] CM/SW Contact  Latanya Maudlin, RN Phone Number: 07/29/2019, 3:57 PM  Clinical Narrative:  Patient with discharge orders in place. Patient has orders to discharge on DME O2. Qualifying SATS note in place. However, patient does not have a chronic respiratory diagnosis. [employer], [employer] and [facility] unable to provide O2 at this time. MD to cancel discharge. Only two options are for patient to consider paying out of pocket or for patient to progress medically and no longer require O2. TOC team will continue to follow for disposition.       Barriers to Discharge: Equipment Delay  Expected Discharge Plan and Services           Expected Discharge Date: 07/29/19                                     Social Determinants of Health (SDOH) Interventions    Readmission Risk Interventions No flowsheet data found.

## 2019-07-29 NOTE — Progress Notes (Signed)
Pharmacy Antibiotic Note  Anita Wells is a 77 y.o. female admitted on 07/27/2019 with acute respiratory failure secondary to pulmonary edema. Patient with past medical history significant for anemia, Barrett;s esophagus,carotid stenosis, CKD requiring dialysis, GERD, hyperlipidemia, hyperparathyroidism, and hypertension. Patient last received HD on 10/3. Pharmacy has been consulted for levofloxacin dosing.  Plan: Patient received levofloxacin 750mg  IV on 10/2. Will continue levofloxacin 500mg  IV Q48hr with first dose today.   Height: 5\' 6"  (167.6 cm) Weight: 116 lb 10 oz (52.9 kg) IBW/kg (Calculated) : 59.3  Temp (24hrs), Avg:98.1 F (36.7 C), Min:97.7 F (36.5 C), Max:98.5 F (36.9 C)  Recent Labs  Lab 07/27/19 1653 07/27/19 2306 07/28/19 0423 07/28/19 0426 07/29/19 0549  WBC 18.8*  --  12.4*  --  8.2  CREATININE 2.57*  --  3.58*  --  3.35*  LATICACIDVEN  --  2.2*  --  1.4  --     Estimated Creatinine Clearance: 11.4 mL/min (A) (by C-G formula based on SCr of 3.35 mg/dL (H)).    Allergies  Allergen Reactions  . Clopidogrel Swelling    Mouth swelling  . Ace Inhibitors Cough  . Amlodipine Other (See Comments)    Peripheral edema    Antimicrobials this admission: Levofloxacin 10/02 >>   Dose adjustments this admission: N/A  Microbiology results: 10/02 MRSA PCR: negative  10/02 COVID: negative.   Thank you for allowing pharmacy to be a part of this patient's care.  Simpson,Michael L 07/29/2019 12:56 PM

## 2019-07-29 NOTE — Progress Notes (Signed)
[facility]  ROUNDING NOTE   Subjective:   Patient states she is breathing better.   PRBC transfusion yesterday.   Hemodialysis treatment yesterday. Cramping on dialysis. Minimal ultrafiltration.   Objective:  Vital signs in last 24 hours:  Temp:  [97.7 F (36.5 C)-98.5 F (36.9 C)] 98 F (36.7 C) (10/04 1238) Pulse Rate:  [57-82] 77 (10/04 1238) Resp:  [14-19] 18 (10/04 1238) BP: (102-133)/(40-53) 133/47 (10/04 1238) SpO2:  [84 %-99 %] 97 % (10/04 1238)  Weight change:  Filed Weights   07/27/19 1655 07/27/19 2253  Weight: 56.7 kg 52.9 kg    Intake/Output: I/O last 3 completed shifts: In: 135.1 [IV Piggyback:135.1] Out: 2088 [Other:2088]   Intake/Output this shift:  No intake/output data recorded.  Physical Exam: General: NAD, sitting in chair  Head: Normocephalic, atraumatic. Moist oral mucosal membranes  Eyes: Anicteric, PERRL  Neck: Supple, trachea midline  Lungs:  Basilar crackles, R > L , 1L [place] O2  Heart: Regular rate and rhythm  Abdomen:  Soft, nontender,   Extremities: No peripheral edema.  Neurologic: Nonfocal, moving all four extremities  Skin: No lesions  Access: RIJ permcath, Left AVF maturing, PD catheter    Basic Metabolic Panel: Recent Labs  Lab 07/27/19 1653 07/28/19 0423 07/29/19 0549  NA 138 137 139  K 3.5 3.1* 3.5  CL 99 101 98  CO2 28 26 33*  GLUCOSE 118* 121* 146*  BUN 19 26* 17  CREATININE 2.57* 3.58* 3.35*  CALCIUM 8.4* 8.1* 8.2*    Liver Function Tests: No results for input(s): AST, ALT, ALKPHOS, BILITOT, PROT, ALBUMIN in the last 168 hours. No results for input(s): LIPASE, AMYLASE in the last 168 hours. No results for input(s): AMMONIA in the last 168 hours.  CBC: Recent Labs  Lab 07/27/19 1653 07/28/19 0423 07/29/19 0549  WBC 18.8* 12.4* 8.2  NEUTROABS 16.8*  --   --   HGB 7.6* 6.8* 8.5*  HCT 23.9* 21.4* 26.8*  MCV 98.0 98.6 97.8  PLT 225 186 191    Cardiac Enzymes: No results for input(s):  CKTOTAL, CKMB, CKMBINDEX, TROPONINI in the last 168 hours.  BNP: Invalid input(s): POCBNP  CBG: Recent Labs  Lab 07/28/19 0749 07/29/19 0806  GLUCAP 129* 144*    Microbiology: Results for orders placed or performed during the hospital encounter of 07/27/19  SARS Coronavirus 2 [facility] order, Performed in [facility] hospital lab) Nasopharyngeal Nasopharyngeal Swab     Status: None   Collection Time: 07/27/19  4:53 PM   Specimen: Nasopharyngeal Swab  Result Value Ref Range Status   SARS Coronavirus 2 NEGATIVE NEGATIVE Final    Comment: (NOTE) If result is NEGATIVE SARS-CoV-2 target nucleic acids are NOT DETECTED. The SARS-CoV-2 RNA is generally detectable in upper and lower  respiratory specimens during the acute phase of infection. The lowest  concentration of SARS-CoV-2 viral copies this assay can detect is 250  copies / mL. A negative result does not preclude SARS-CoV-2 infection  and should not be used as the sole basis for treatment or other  patient management decisions.  A negative result may occur with  improper specimen collection / handling, submission of specimen other  than nasopharyngeal swab, presence of viral mutation(s) within the  areas targeted by this assay, and inadequate number of viral copies  (<250 copies / mL). A negative result must be combined with clinical  observations, patient history, and epidemiological information. If result is POSITIVE SARS-CoV-2 target nucleic acids are DETECTED. The SARS-CoV-2 RNA  is generally detectable in upper and lower  respiratory specimens dur ing the acute phase of infection.  Positive  results are indicative of active infection with SARS-CoV-2.  Clinical  correlation with patient history and other diagnostic information is  necessary to determine patient infection status.  Positive results do  not rule out bacterial infection or co-infection with other viruses. If result is PRESUMPTIVE POSTIVE SARS-CoV-2 nucleic  acids MAY BE PRESENT.   A presumptive positive result was obtained on the submitted specimen  and confirmed on repeat testing.  While 2019 novel coronavirus  (SARS-CoV-2) nucleic acids may be present in the submitted sample  additional confirmatory testing may be necessary for epidemiological  and / or clinical management purposes  to differentiate between  SARS-CoV-2 and other Sarbecovirus currently known to infect humans.  If clinically indicated additional testing with an alternate test  methodology [phone]) is advised. The SARS-CoV-2 RNA is generally  detectable in upper and lower respiratory sp ecimens during the acute  phase of infection. The expected result is Negative. Fact Sheet for Patients:  StrictlyIdeas.no Fact Sheet for Healthcare Providers: BankingDealers.co.za This test is not yet approved or cleared by the [place] FDA and has been authorized for detection and/or diagnosis of SARS-CoV-2 by FDA under an Emergency Use Authorization (EUA).  This EUA will remain in effect (meaning this test can be used) for the duration of the COVID-19 declaration under Section 564(b)(1) of the Act, 21 U.S.C. section 360bbb-3(b)(1), unless the authorization is terminated or revoked sooner. Performed at [facility], [address] [place], [place] [address]   MRSA PCR Screening     Status: None   Collection Time: 07/27/19 11:19 PM   Specimen: Nasal Mucosa; Nasopharyngeal  Result Value Ref Range Status   MRSA by PCR NEGATIVE NEGATIVE Final    Comment:        The GeneXpert MRSA Assay (FDA approved for NASAL specimens only), is one component of a comprehensive MRSA colonization surveillance program. It is not intended to diagnose MRSA infection nor to guide or monitor treatment for MRSA infections. Performed at [facility], [address] [place], [place] [address]     Coagulation Studies: No results for  input(s): LABPROT, INR in the last 72 hours.  Urinalysis: No results for input(s): COLORURINE, LABSPEC, PHURINE, GLUCOSEU, HGBUR, BILIRUBINUR, KETONESUR, PROTEINUR, UROBILINOGEN, NITRITE, LEUKOCYTESUR in the last 72 hours.  Invalid input(s): APPERANCEUR    Imaging: Dg Chest 2 View  Result Date: 07/29/2019 CLINICAL DATA:  Pulmonary edema.  Shortness of breath. EXAM: CHEST - 2 VIEW COMPARISON:  07/27/2019 FINDINGS: Right IJ central venous catheter unchanged. Lungs are adequately inflated with persistent mixed interstitial airspace density over the right mid to upper lung with slightly improved aeration. Left lung essentially clear. No effusion or pneumothorax. Cardiomediastinal silhouette and remainder of the exam is unchanged. IMPRESSION: Persistent mixed interstitial airspace density over the right mid to upper lung with slightly improved aeration which may be due to ongoing infection versus asymmetric edema. Electronically Signed   By: Marin Olp M.D.   On: 07/29/2019 09:57   Dg Chest Port 1 View  Result Date: 07/27/2019 CLINICAL DATA:  Pt came from dialysis today, pt does peritoneal dialysis but unable to draw fluid off, was found to be SOB by staff. dyspnea EXAM: PORTABLE CHEST 1 VIEW COMPARISON:  Radiograph 04/04/2011 FINDINGS: Large-bore RIGHT central venous line tips in the RIGHT atrium. Normal cardiac silhouette. There is asymmetric diffuse airspace disease in the RIGHT upper lobe  and to a lesser degree in the RIGHT lower lobe. Nipple shadows noted. No focal consolidation. No pneumothorax. Lungs are hyperinflated. IMPRESSION: Findings most suggestive of asymmetric pulmonary edema in the RIGHT lung. Electronically Signed   By: Suzy Bouchard M.D.   On: 07/27/2019 17:09     Medications:    . sodium chloride   Intravenous Once  . aspirin EC  81 mg Oral Daily  . atorvastatin  40 mg Oral q1800  . Chlorhexidine Gluconate Cloth  6 each Topical Q0600  . docusate sodium  100 mg Oral BID   . escitalopram  10 mg Oral Daily  . ferrous sulfate  325 mg Oral Q breakfast  . heparin  5,000 Units Subcutaneous Q8H  . mirtazapine  7.5 mg Oral QHS  . [START ON 07/30/2019] multivitamin  1 tablet Oral 3 times weekly     Assessment/ Plan:  Anita Wells is a 78 y.o. black female with end stage renal disease on hemodialysis, hypertension, renal artery stenosis, GERD, hyperlipidemia who presents to [facility] ED with acute pulmonary edema.   1. End Stage Renale Disease: has received two back up hemodialysis treatments this admission. Clinically does not seem to have any extra fluid on her today.  Currently getting peritoneal dialysis training. As outpatient.    - Evaluate daily for dialysis need.    2. Hypertension: blood pressure at goal. Holding furosemide, hydralazine, losartan, metoprolol, and torsemide.  3. Anemia of chronic kidney disease: status post PRBC transfusion on 10/3. Hemoglobin 8.5  4. Secondary Hyperparathyroidism: outpatient labs from 10/1 with PTH 382, phosphorus 4.7 - not currently on binders - calcitriol on hold  5. Acute respiratory failure: with right sided pulmonary edema. Requiring oxygen - weaned down to 1 liter.   Also with leukocytosis - Empiric levofloxacin    LOS: 2 Arvid Marengo 10/4/202012:55 PM

## 2019-07-29 NOTE — Discharge Summary (Signed)
[name] NAME: Anita Wells    MR#:  [id number]  [place]:  11-10-1940  DATE OF ADMISSION:  07/27/2019   ADMITTING PHYSICIAN: Epifanio Lesches, MD  DATE OF DISCHARGE:  07/29/19  PRIMARY CARE PHYSICIAN: Dion Body, MD   ADMISSION DIAGNOSIS:   Acute pulmonary edema ([place]) [J81.0] Hypoxia [R09.02] Leukocytosis, unspecified type [D72.829]  DISCHARGE DIAGNOSIS:   Active Problems:   Shortness of breath   SECONDARY DIAGNOSIS:   Past Medical History:  Diagnosis Date  . Anemia   . Barrett's esophagus   . Carotid stenosis, bilateral   . Chronic kidney disease   . GERD (gastroesophageal reflux disease)   . Hyperlipidemia   . Hyperparathyroidism ([place])   . Hypertension   . Renal artery stenosis ([facility])    left    HOSPITAL COURSE:   78 year old female with past medical history significant for end-stage renal disease, just started on peritoneal dialysis, hypertension, renal artery stenosis presents to hospital secondary to shortness of breath  1.  Acute hypoxic respiratory failure-secondary to acute pulmonary edema -Acutely needing 4 L oxygen on admission, currently down to 1-2 L. -Continue incentive spirometry.  Chest x-ray shows much improvement.  However patient still needing 2 L O2 on exertion.  Discharged on 2 L oxygen and outpatient weaning as tolerated. -Chest x-ray on admission showing pulmonary edema and possibly an infiltrate-repeat chest x-ray with improvement -Patient recently started on peritoneal dialysis as outpatient and has been getting hemodialysis PRN.  -Ambulating well without any dyspnea.  2.  End-stage renal disease-was on hemodialysis, has right chest permacath. -In PD training, has a peritoneal dialysis catheter in place.  Receiving hemodialysis as needed as outpatient -Appreciate nephrology consult as inpatient.  She has received 2 sessions of hemodialysis in the hospital.  3.   Acute on chronic anemia-anemia of chronic disease, slow drop.  No active bleeding -Hemoglobin at 6.8.  Has iron deficiency anemia.  Continue iron supplements -Received 1 unit packed RBC transfusion this admission and hemoglobin was greater than 8. -Continue iron supplements-follow-up as outpatient  4.  Depression-on low-dose Remeron and Lexapro  5.  Hypertension-blood pressure has been low normal in the hospital. -Her torsemide, metoprolol, losartan, hydralazine and clonidine have been discontinued.  She will continue to take her Lasix on nondialysis days.   Patient is independent at baseline will discharge home today   DISCHARGE CONDITIONS:   Guarded  CONSULTS OBTAINED:   Nephrology consultation  DRUG ALLERGIES:   Allergies  Allergen Reactions  . Clopidogrel Swelling    Mouth swelling  . Ace Inhibitors Cough  . Amlodipine Other (See Comments)    Peripheral edema   DISCHARGE MEDICATIONS:   Allergies as of 07/29/2019      Reactions   Clopidogrel Swelling   Mouth swelling   Ace Inhibitors Cough   Amlodipine Other (See Comments)   Peripheral edema      Medication List    STOP taking these medications   cloNIDine 0.2 MG tablet Commonly known as: CATAPRES   hydrALAZINE 100 MG tablet Commonly known as: APRESOLINE   losartan 100 MG tablet Commonly known as: COZAAR   metoprolol tartrate 100 MG tablet Commonly known as: LOPRESSOR   torsemide 10 MG tablet Commonly known as: DEMADEX     TAKE these medications   aspirin EC 81 MG tablet Take 81 mg by mouth daily.   atorvastatin 40 MG tablet Commonly known as: LIPITOR Take 40 mg by mouth  at bedtime.   calcitRIOL 0.25 MCG capsule Commonly known as: ROCALTROL Take 0.25 mcg by mouth every Monday, Wednesday, and Friday.   docusate sodium 100 MG capsule Commonly known as: COLACE Take 100 mg by mouth 2 (two) times daily.   escitalopram 10 MG tablet Commonly known as: LEXAPRO Take 10 mg by mouth daily.    ferrous sulfate 325 (65 FE) MG tablet Take 325 mg by mouth daily with breakfast.   fluticasone 50 MCG/ACT nasal spray Commonly known as: FLONASE Place 2 sprays into the nose daily.   furosemide 40 MG tablet Commonly known as: LASIX Take 40 mg by mouth daily. On non-dialysis days   levofloxacin 500 MG tablet Commonly known as: Levaquin Take 1 tablet (500 mg total) by mouth every other day for 5 days. Start taking on: July 31, 2019   mirtazapine 7.5 MG tablet Commonly known as: REMERON Take 7.5 mg by mouth at bedtime.   multivitamin Tabs tablet Take 1 tablet by mouth every Monday, Wednesday, and Friday.   omeprazole 40 MG capsule Commonly known as: PRILOSEC Take 40 mg by mouth daily.   sodium bicarbonate 325 MG tablet Take 325 mg by mouth 2 (two) times daily.            Durable Medical Equipment  (From admission, onward)         Start     Ordered   07/29/19 1234  For home use only DME oxygen  Once    Question Answer Comment  Length of Need 6 Months   Mode or (Route) Nasal cannula   Liters per Minute 2   Frequency Continuous (stationary and portable oxygen unit needed)   Oxygen conserving device Yes   Oxygen delivery system Gas      07/29/19 1233           DISCHARGE INSTRUCTIONS:   1. PCP f/u in 1-2 weeks 2. Nephrology f/u for PD  Training as scheduled  DIET:   Renal diet  ACTIVITY:   Activity as tolerated  OXYGEN:   Home Oxygen: Yes.    Oxygen Delivery: 2 liters/min via Patient connected to nasal cannula oxygen  DISCHARGE LOCATION:   home   If you experience worsening of your admission symptoms, develop shortness of breath, life threatening emergency, suicidal or homicidal thoughts you must seek medical attention immediately by calling 911 or calling your MD immediately  if symptoms less severe.  You Must read complete instructions/literature along with all the possible adverse reactions/side effects for all the Medicines you take and  that have been prescribed to you. Take any new Medicines after you have completely understood and accpet all the possible adverse reactions/side effects.   Please note  You were cared for by a hospitalist during your hospital stay. If you have any questions about your discharge medications or the care you received while you were in the hospital after you are discharged, you can call the unit and asked to speak with the hospitalist on call if the hospitalist that took care of you is not available. Once you are discharged, your primary care physician will handle any further medical issues. Please note that NO REFILLS for any discharge medications will be authorized once you are discharged, as it is imperative that you return to your primary care physician (or establish a relationship with a primary care physician if you do not have one) for your aftercare needs so that they can reassess your need for medications and monitor your lab values.  On the day of Discharge:  VITAL SIGNS:   Blood pressure (!) 133/47, pulse 77, temperature 98 F (36.7 C), temperature source Oral, resp. rate 18, height 5\' 6"  (1.676 m), weight 52.9 kg, SpO2 97 %.  PHYSICAL EXAMINATION:    GENERAL:  78 y.o.-year-old patient lying in the bed with no acute distress.  EYES: Pupils equal, round, reactive to light and accommodation. No scleral icterus. Extraocular muscles intact.  HEENT: Head atraumatic, normocephalic. Oropharynx and nasopharynx clear.  NECK:  Supple, no jugular venous distention. No thyroid enlargement, no tenderness.  LUNGS: Normal breath sounds bilaterally, no wheezing,  rhonchi or crepitation. No use of accessory muscles of respiration.  No crackles today CARDIOVASCULAR: S1, S2 normal. No  rubs, or gallops.  2/6 systolic murmur in place ABDOMEN: Soft, nontender, nondistended. Bowel sounds present. No organomegaly or mass.  Peritoneal dialysis catheter in place EXTREMITIES: No pedal edema, cyanosis, or  clubbing.  NEUROLOGIC: Cranial nerves II through XII are intact. Muscle strength 5/5 in all extremities. Sensation intact. Gait not checked.  PSYCHIATRIC: The patient is alert and oriented x 3.  Intermittent confusion noted. SKIN: No obvious rash, lesion, or ulcer. Right chest permacath in place.  Nonfunctioning left cubital fossa AV fistula present.  DATA REVIEW:   CBC Recent Labs  Lab 07/29/19 0549  WBC 8.2  HGB 8.5*  HCT 26.8*  PLT 191    Chemistries  Recent Labs  Lab 07/29/19 0549  NA 139  K 3.5  CL 98  CO2 33*  GLUCOSE 146*  BUN 17  CREATININE 3.35*  CALCIUM 8.2*     Microbiology Results  Results for orders placed or performed during the hospital encounter of 07/27/19  SARS Coronavirus 2 [facility] order, Performed in [facility] hospital lab) Nasopharyngeal Nasopharyngeal Swab     Status: None   Collection Time: 07/27/19  4:53 PM   Specimen: Nasopharyngeal Swab  Result Value Ref Range Status   SARS Coronavirus 2 NEGATIVE NEGATIVE Final    Comment: (NOTE) If result is NEGATIVE SARS-CoV-2 target nucleic acids are NOT DETECTED. The SARS-CoV-2 RNA is generally detectable in upper and lower  respiratory specimens during the acute phase of infection. The lowest  concentration of SARS-CoV-2 viral copies this assay can detect is 250  copies / mL. A negative result does not preclude SARS-CoV-2 infection  and should not be used as the sole basis for treatment or other  patient management decisions.  A negative result may occur with  improper specimen collection / handling, submission of specimen other  than nasopharyngeal swab, presence of viral mutation(s) within the  areas targeted by this assay, and inadequate number of viral copies  (<250 copies / mL). A negative result must be combined with clinical  observations, patient history, and epidemiological information. If result is POSITIVE SARS-CoV-2 target nucleic acids are DETECTED. The SARS-CoV-2 RNA is generally  detectable in upper and lower  respiratory specimens dur ing the acute phase of infection.  Positive  results are indicative of active infection with SARS-CoV-2.  Clinical  correlation with patient history and other diagnostic information is  necessary to determine patient infection status.  Positive results do  not rule out bacterial infection or co-infection with other viruses. If result is PRESUMPTIVE POSTIVE SARS-CoV-2 nucleic acids MAY BE PRESENT.   A presumptive positive result was obtained on the submitted specimen  and confirmed on repeat testing.  While 2019 novel coronavirus  (SARS-CoV-2) nucleic acids may be present in the submitted sample  additional confirmatory  testing may be necessary for epidemiological  and / or clinical management purposes  to differentiate between  SARS-CoV-2 and other Sarbecovirus currently known to infect humans.  If clinically indicated additional testing with an alternate test  methodology [phone]) is advised. The SARS-CoV-2 RNA is generally  detectable in upper and lower respiratory sp ecimens during the acute  phase of infection. The expected result is Negative. Fact Sheet for Patients:  StrictlyIdeas.no Fact Sheet for Healthcare Providers: BankingDealers.co.za This test is not yet approved or cleared by the [place] FDA and has been authorized for detection and/or diagnosis of SARS-CoV-2 by FDA under an Emergency Use Authorization (EUA).  This EUA will remain in effect (meaning this test can be used) for the duration of the COVID-19 declaration under Section 564(b)(1) of the Act, 21 U.S.C. section 360bbb-3(b)(1), unless the authorization is terminated or revoked sooner. Performed at [facility], [address] [place], [place] [address]   MRSA PCR Screening     Status: None   Collection Time: 07/27/19 11:19 PM   Specimen: Nasal Mucosa; Nasopharyngeal  Result Value Ref Range  Status   MRSA by PCR NEGATIVE NEGATIVE Final    Comment:        The GeneXpert MRSA Assay (FDA approved for NASAL specimens only), is one component of a comprehensive MRSA colonization surveillance program. It is not intended to diagnose MRSA infection nor to guide or monitor treatment for MRSA infections. Performed at [facility], [address], [place], [place] [address]     RADIOLOGY:  Dg Chest 2 View  Result Date: 07/29/2019 CLINICAL DATA:  Pulmonary edema.  Shortness of breath. EXAM: CHEST - 2 VIEW COMPARISON:  07/27/2019 FINDINGS: Right IJ central venous catheter unchanged. Lungs are adequately inflated with persistent mixed interstitial airspace density over the right mid to upper lung with slightly improved aeration. Left lung essentially clear. No effusion or pneumothorax. Cardiomediastinal silhouette and remainder of the exam is unchanged. IMPRESSION: Persistent mixed interstitial airspace density over the right mid to upper lung with slightly improved aeration which may be due to ongoing infection versus asymmetric edema. Electronically Signed   By: Marin Olp M.D.   On: 07/29/2019 09:57     Management plans discussed with the patient, family and they are in agreement.  CODE STATUS:     Code Status Orders  (From admission, onward)         Start     Ordered   07/27/19 2254  Full code  Continuous     07/27/19 2253        Code Status History    This patient has a current code status but no historical code status.   Advance Care Planning Activity      TOTAL TIME TAKING CARE OF THIS PATIENT: 38 minutes.    Gladstone Lighter M.D on 07/29/2019 at 2:02 PM  Between 7am to 6pm - Pager - [phone]  After 6pm go to [email or web address] - Proofreader  [facility]  Office  [phone]  CC: Primary care physician; Dion Body, MD   Note: This dictation was prepared with Dragon dictation along with smaller  phrase technology. Any transcriptional errors that result from this process are unintentional.

## 2019-07-29 NOTE — Progress Notes (Signed)
[place] [facility] NAME: Anita Wells    MR#:  [id number]  DATE OF BIRTH:  08-18-1941  SUBJECTIVE:  CHIEF COMPLAINT:   Chief Complaint  Patient presents with  . Shortness of Breath  . Respiratory Distress   -Breathing is much better.  Wants to be discharged home today.  Still needing 1 L oxygen acutely.  REVIEW OF SYSTEMS:  Review of Systems  Constitutional: Positive for malaise/fatigue. Negative for chills and fever.  HENT: Negative for congestion, ear discharge, hearing loss and nosebleeds.   Eyes: Negative for blurred vision and double vision.  Respiratory: Positive for shortness of breath. Negative for cough and wheezing.   Cardiovascular: Negative for chest pain and palpitations.  Gastrointestinal: Negative for abdominal pain, constipation, diarrhea, nausea and vomiting.  Genitourinary: Negative for dysuria.  Musculoskeletal: Negative for myalgias.  Neurological: Negative for dizziness, focal weakness, seizures and headaches.  Psychiatric/Behavioral: Negative for depression.    DRUG ALLERGIES:   Allergies  Allergen Reactions  . Clopidogrel Swelling    Mouth swelling  . Ace Inhibitors Cough  . Amlodipine Other (See Comments)    Peripheral edema    VITALS:  Blood pressure (!) 120/53, pulse 82, temperature 98.2 F (36.8 C), temperature source Oral, resp. rate 18, height 5\' 6"  (1.676 m), weight 52.9 kg, SpO2 95 %.  PHYSICAL EXAMINATION:  Physical Exam   GENERAL:  78 y.o.-year-old patient lying in the bed with no acute distress.  EYES: Pupils equal, round, reactive to light and accommodation. No scleral icterus. Extraocular muscles intact.  HEENT: Head atraumatic, normocephalic. Oropharynx and nasopharynx clear.  NECK:  Supple, no jugular venous distention. No thyroid enlargement, no tenderness.  LUNGS: Normal breath sounds bilaterally, no wheezing,  rhonchi or crepitation. No use of accessory muscles of respiration.   No crackles today CARDIOVASCULAR: S1, S2 normal. No  rubs, or gallops.  2/6 systolic murmur in place ABDOMEN: Soft, nontender, nondistended. Bowel sounds present. No organomegaly or mass.  Peritoneal dialysis catheter in place EXTREMITIES: No pedal edema, cyanosis, or clubbing.  NEUROLOGIC: Cranial nerves II through XII are intact. Muscle strength 5/5 in all extremities. Sensation intact. Gait not checked.  PSYCHIATRIC: The patient is alert and oriented x 3.  Intermittent confusion noted. SKIN: No obvious rash, lesion, or ulcer. Right chest permacath in place.  Nonfunctioning left cubital fossa AV fistula present.   LABORATORY PANEL:   CBC Recent Labs  Lab 07/29/19 0549  WBC 8.2  HGB 8.5*  HCT 26.8*  PLT 191   ------------------------------------------------------------------------------------------------------------------  Chemistries  Recent Labs  Lab 07/29/19 0549  NA 139  K 3.5  CL 98  CO2 33*  GLUCOSE 146*  BUN 17  CREATININE 3.35*  CALCIUM 8.2*   ------------------------------------------------------------------------------------------------------------------  Cardiac Enzymes No results for input(s): TROPONINI in the last 168 hours. ------------------------------------------------------------------------------------------------------------------  RADIOLOGY:  Dg Chest Port 1 View  Result Date: 07/27/2019 CLINICAL DATA:  Pt came from dialysis today, pt does peritoneal dialysis but unable to draw fluid off, was found to be SOB by staff. dyspnea EXAM: PORTABLE CHEST 1 VIEW COMPARISON:  Radiograph 04/04/2011 FINDINGS: Large-bore RIGHT central venous line tips in the RIGHT atrium. Normal cardiac silhouette. There is asymmetric diffuse airspace disease in the RIGHT upper lobe and to a lesser degree in the RIGHT lower lobe. Nipple shadows noted. No focal consolidation. No pneumothorax. Lungs are hyperinflated. IMPRESSION: Findings most suggestive of asymmetric pulmonary  edema in the RIGHT lung. Electronically Signed   By: Nicole Kindred  Leonia Reeves M.D.   On: 07/27/2019 17:09    EKG:   Orders placed or performed during the hospital encounter of 07/27/19  . ED EKG  . ED EKG    ASSESSMENT AND PLAN:   78 year old female with past medical history significant for end-stage renal disease, just started on peritoneal dialysis, hypertension, renal artery stenosis presents to hospital secondary to shortness of breath  1.  Acute hypoxic respiratory failure-secondary to acute pulmonary edema -Acutely needing 4 L oxygen on admission, currently down to 1 L.-Continue to wean off oxygen as tolerated.  Incentive spirometry ordered -Chest x-ray on admission showing pulmonary edema and possibly an infiltrate-repeat chest x-ray with improvement -Patient recently started on peritoneal dialysis and has been getting hemodialysis PRN.  - Received dialysis prior to admission for shortness of breath as outpatient, and received emergency dialysis on admission. -Getting another dialysis today for more ultrafiltration.  2.  End-stage renal disease-was on hemodialysis, has right chest permacath. -In PD training, has a peritoneal dialysis catheter in place.  Receiving hemodialysis as needed as outpatient -Appreciate nephrology consult as inpatient.  3.  Acute on chronic anemia-anemia of chronic disease, slow drop.  No active bleeding -Hemoglobin at 6.8.  Has iron deficiency anemia.  Continue iron supplements -Received 1 unit packed RBC transfusion this admission and hemoglobin was greater than 8. -Continue iron supplements  4.  Depression-on low-dose Remeron and Lexapro  5.  DVT prophylaxis-subcutaneous heparin  Patient is independent at baseline If able to be weaned off oxygen, anticipate discharge today.     All the records are reviewed and case discussed with Care Management/Social Workerr. Management plans discussed with the patient, family and they are in agreement.  CODE  STATUS: Full Code  TOTAL TIME TAKING CARE OF THIS PATIENT: 38 minutes.   POSSIBLE D/C TODAY OR TOMORROW, DEPENDING ON CLINICAL CONDITION.   Gladstone Lighter M.D on 07/29/2019 at 9:13 AM  Between 7am to 6pm - Pager - [phone]  After 6pm go to [email or web address] - password [email or web address]  Office  [phone]  CC: Primary care physician; Dion Body, MD

## 2019-07-30 LAB — GLUCOSE, CAPILLARY: Glucose-Capillary: 128 mg/dL — ABNORMAL HIGH (ref 70–99)

## 2019-07-30 MED ORDER — LEVOFLOXACIN IN D5W 500 MG/100ML IV SOLN
500.0000 mg | INTRAVENOUS | Status: DC
  Filled 2019-07-30: qty 100

## 2019-07-30 MED ORDER — LEVOFLOXACIN 500 MG PO TABS: 500.0000 mg | ORAL_TABLET | ORAL | 0 refills | Status: AC

## 2019-07-30 NOTE — Discharge Summary (Signed)
[place] [facility] [place] NAME: Anita Wells    MR#:  [id number]  [place]:  [birth year]  DATE OF ADMISSION:  07/27/2019   ADMITTING PHYSICIAN: Epifanio Lesches, MD  DATE OF DISCHARGE:  07/30/19  PRIMARY CARE PHYSICIAN: Dion Body, MD   ADMISSION DIAGNOSIS:   Acute pulmonary edema ([place]) [J81.0] Hypoxia [R09.02] Leukocytosis, unspecified type [D72.829]  DISCHARGE DIAGNOSIS:   Active Problems:   Shortness of breath   SECONDARY DIAGNOSIS:   Past Medical History:  Diagnosis Date  . Anemia   . Barrett's esophagus   . Carotid stenosis, bilateral   . Chronic kidney disease   . GERD (gastroesophageal reflux disease)   . Hyperlipidemia   . Hyperparathyroidism ([facility])   . Hypertension   . Renal artery stenosis ([facility])    left    HOSPITAL COURSE:   78 year old female with past medical history significant for end-stage renal disease, just started on peritoneal dialysis, hypertension, renal artery stenosis presents to hospital secondary to shortness of breath  1.  Acute hypoxic respiratory failure-secondary to acute pulmonary edema -Acutely needing 4 L oxygen on admission, currently down to 1-2 L. -Continue incentive spirometry.  Chest x-ray shows much improvement.  However patient still needing 2 L O2 on exertion.   -will be discharged on 2 L oxygen and outpatient weaning as tolerated. -Chest x-ray on admission showing pulmonary edema and possibly an infiltrate-repeat chest x-ray with improvement -Patient recently started on peritoneal dialysis as outpatient and has been getting hemodialysis PRN.  -Ambulating well without any dyspnea at becoming hypoxic with movement.-Discharge on home oxygen -Has LVH, moderate pulmonary hypertension.  2.  End-stage renal disease-was on hemodialysis, has right chest permacath. -In PD training, has a peritoneal dialysis catheter in place.  Receiving hemodialysis as needed as outpatient  -Appreciate nephrology consult as inpatient.  She has received 2 sessions of hemodialysis in the hospital.  3.  Acute on chronic anemia-anemia of chronic disease, slow drop.  No active bleeding -Hemoglobin at 6.8.  Has iron deficiency anemia.  Continue iron supplements -Received 1 unit packed RBC transfusion this admission and hemoglobin was greater than 8. -Continue iron supplements-follow-up as outpatient  4.  Depression-on low-dose Remeron and Lexapro  5.  Hypertension-blood pressure has been low normal in the hospital. -Her torsemide, metoprolol, losartan, hydralazine and clonidine have been discontinued.  She will continue to take her Lasix on nondialysis days.   Patient is independent at baseline will discharge home today   DISCHARGE CONDITIONS:   Guarded  CONSULTS OBTAINED:   Nephrology consultation  DRUG ALLERGIES:   Allergies  Allergen Reactions  . Clopidogrel Swelling    Mouth swelling  . Ace Inhibitors Cough  . Amlodipine Other (See Comments)    Peripheral edema   DISCHARGE MEDICATIONS:   Allergies as of 07/30/2019      Reactions   Clopidogrel Swelling   Mouth swelling   Ace Inhibitors Cough   Amlodipine Other (See Comments)   Peripheral edema      Medication List    STOP taking these medications   cloNIDine 0.2 MG tablet Commonly known as: CATAPRES   hydrALAZINE 100 MG tablet Commonly known as: APRESOLINE   losartan 100 MG tablet Commonly known as: COZAAR   metoprolol tartrate 100 MG tablet Commonly known as: LOPRESSOR   torsemide 10 MG tablet Commonly known as: DEMADEX     TAKE these medications   aspirin EC 81 MG tablet Take 81 mg by mouth  daily.   atorvastatin 40 MG tablet Commonly known as: LIPITOR Take 40 mg by mouth at bedtime.   calcitRIOL 0.25 MCG capsule Commonly known as: ROCALTROL Take 0.25 mcg by mouth every Monday, Wednesday, and Friday.   docusate sodium 100 MG capsule Commonly known as: COLACE Take 100 mg  by mouth 2 (two) times daily.   escitalopram 10 MG tablet Commonly known as: LEXAPRO Take 10 mg by mouth daily.   ferrous sulfate 325 (65 FE) MG tablet Take 325 mg by mouth daily with breakfast.   fluticasone 50 MCG/ACT nasal spray Commonly known as: FLONASE Place 2 sprays into the nose daily.   furosemide 40 MG tablet Commonly known as: LASIX Take 40 mg by mouth daily. On non-dialysis days   levofloxacin 500 MG tablet Commonly known as: Levaquin Take 1 tablet (500 mg total) by mouth every other day for 4 days. Start taking on: July 31, 2019   mirtazapine 7.5 MG tablet Commonly known as: REMERON Take 7.5 mg by mouth at bedtime.   multivitamin Tabs tablet Take 1 tablet by mouth every Monday, Wednesday, and Friday.   omeprazole 40 MG capsule Commonly known as: PRILOSEC Take 40 mg by mouth daily.   sodium bicarbonate 325 MG tablet Take 325 mg by mouth 2 (two) times daily.            Durable Medical Equipment  (From admission, onward)         Start     Ordered   07/29/19 1234  For home use only DME oxygen  Once    Question Answer Comment  Length of Need 6 Months   Mode or (Route) Nasal cannula   Liters per Minute 2   Frequency Continuous (stationary and portable oxygen unit needed)   Oxygen conserving device Yes   Oxygen delivery system Gas      07/29/19 1233           DISCHARGE INSTRUCTIONS:   1. PCP f/u in 1-2 weeks 2. Nephrology f/u for PD  Training as scheduled  DIET:   Renal diet  ACTIVITY:   Activity as tolerated  OXYGEN:   Home Oxygen: Yes.    Oxygen Delivery: 2 liters/min via Patient connected to nasal cannula oxygen  DISCHARGE LOCATION:   home   If you experience worsening of your admission symptoms, develop shortness of breath, life threatening emergency, suicidal or homicidal thoughts you must seek medical attention immediately by calling 911 or calling your MD immediately  if symptoms less severe.  You Must read complete  instructions/literature along with all the possible adverse reactions/side effects for all the Medicines you take and that have been prescribed to you. Take any new Medicines after you have completely understood and accpet all the possible adverse reactions/side effects.   Please note  You were cared for by a hospitalist during your hospital stay. If you have any questions about your discharge medications or the care you received while you were in the hospital after you are discharged, you can call the unit and asked to speak with the hospitalist on call if the hospitalist that took care of you is not available. Once you are discharged, your primary care physician will handle any further medical issues. Please note that NO REFILLS for any discharge medications will be authorized once you are discharged, as it is imperative that you return to your primary care physician (or establish a relationship with a primary care physician if you do not have one) for your  aftercare needs so that they can reassess your need for medications and monitor your lab values.    On the day of Discharge:  VITAL SIGNS:   Blood pressure (!) 137/55, pulse 86, temperature 98.4 F (36.9 C), temperature source Oral, resp. rate 18, height 5\' 6"  (1.676 m), weight 56.7 kg, SpO2 95 %.  PHYSICAL EXAMINATION:    GENERAL:  78 y.o.-year-old patient lying in the bed with no acute distress.  EYES: Pupils equal, round, reactive to light and accommodation. No scleral icterus. Extraocular muscles intact.  HEENT: Head atraumatic, normocephalic. Oropharynx and nasopharynx clear.  NECK:  Supple, no jugular venous distention. No thyroid enlargement, no tenderness.  LUNGS: Normal breath sounds bilaterally, no wheezing,  rhonchi or crepitation. No use of accessory muscles of respiration.  No crackles today CARDIOVASCULAR: S1, S2 normal. No  rubs, or gallops.  2/6 systolic murmur in place ABDOMEN: Soft, nontender, nondistended. Bowel sounds  present. No organomegaly or mass.  Peritoneal dialysis catheter in place EXTREMITIES: No pedal edema, cyanosis, or clubbing.  NEUROLOGIC: Cranial nerves II through XII are intact. Muscle strength 5/5 in all extremities. Sensation intact. Gait not checked.  PSYCHIATRIC: The patient is alert and oriented x 3.  Intermittent confusion noted. SKIN: No obvious rash, lesion, or ulcer. Right chest permacath in place.  Nonfunctioning left cubital fossa AV fistula present.  DATA REVIEW:   CBC Recent Labs  Lab 07/29/19 0549  WBC 8.2  HGB 8.5*  HCT 26.8*  PLT 191    Chemistries  Recent Labs  Lab 07/29/19 0549  NA 139  K 3.5  CL 98  CO2 33*  GLUCOSE 146*  BUN 17  CREATININE 3.35*  CALCIUM 8.2*     Microbiology Results  Results for orders placed or performed during the hospital encounter of 07/27/19  SARS Coronavirus 2 [facility] order, Performed in [facility] hospital lab) Nasopharyngeal Nasopharyngeal Swab     Status: None   Collection Time: 07/27/19  4:53 PM   Specimen: Nasopharyngeal Swab  Result Value Ref Range Status   SARS Coronavirus 2 NEGATIVE NEGATIVE Final    Comment: (NOTE) If result is NEGATIVE SARS-CoV-2 target nucleic acids are NOT DETECTED. The SARS-CoV-2 RNA is generally detectable in upper and lower  respiratory specimens during the acute phase of infection. The lowest  concentration of SARS-CoV-2 viral copies this assay can detect is 250  copies / mL. A negative result does not preclude SARS-CoV-2 infection  and should not be used as the sole basis for treatment or other  patient management decisions.  A negative result may occur with  improper specimen collection / handling, submission of specimen other  than nasopharyngeal swab, presence of viral mutation(s) within the  areas targeted by this assay, and inadequate number of viral copies  (<250 copies / mL). A negative result must be combined with clinical  observations, patient history, and epidemiological  information. If result is POSITIVE SARS-CoV-2 target nucleic acids are DETECTED. The SARS-CoV-2 RNA is generally detectable in upper and lower  respiratory specimens dur ing the acute phase of infection.  Positive  results are indicative of active infection with SARS-CoV-2.  Clinical  correlation with patient history and other diagnostic information is  necessary to determine patient infection status.  Positive results do  not rule out bacterial infection or co-infection with other viruses. If result is PRESUMPTIVE POSTIVE SARS-CoV-2 nucleic acids MAY BE PRESENT.   A presumptive positive result was obtained on the submitted specimen  and confirmed on repeat testing.  While 2019 novel coronavirus  (SARS-CoV-2) nucleic acids may be present in the submitted sample  additional confirmatory testing may be necessary for epidemiological  and / or clinical management purposes  to differentiate between  SARS-CoV-2 and other Sarbecovirus currently known to infect humans.  If clinically indicated additional testing with an alternate test  methodology [phone]) is advised. The SARS-CoV-2 RNA is generally  detectable in upper and lower respiratory sp ecimens during the acute  phase of infection. The expected result is Negative. Fact Sheet for Patients:  StrictlyIdeas.no Fact Sheet for Healthcare Providers: BankingDealers.co.za This test is not yet approved or cleared by the [place] FDA and has been authorized for detection and/or diagnosis of SARS-CoV-2 by FDA under an Emergency Use Authorization (EUA).  This EUA will remain in effect (meaning this test can be used) for the duration of the COVID-19 declaration under Section 564(b)(1) of the Act, 21 U.S.C. section 360bbb-3(b)(1), unless the authorization is terminated or revoked sooner. Performed at [facility], [address] [place], [place] [address]   MRSA PCR Screening      Status: None   Collection Time: 07/27/19 11:19 PM   Specimen: Nasal Mucosa; Nasopharyngeal  Result Value Ref Range Status   MRSA by PCR NEGATIVE NEGATIVE Final    Comment:        The GeneXpert MRSA Assay (FDA approved for NASAL specimens only), is one component of a comprehensive MRSA colonization surveillance program. It is not intended to diagnose MRSA infection nor to guide or monitor treatment for MRSA infections. Performed at [facility], [address] [place], [place] [address]     RADIOLOGY:  No results found.   Management plans discussed with the patient, family and they are in agreement.  CODE STATUS:     Code Status Orders  (From admission, onward)         Start     Ordered   07/27/19 2254  Full code  Continuous     07/27/19 2253        Code Status History    This patient has a current code status but no historical code status.   Advance Care Planning Activity      TOTAL TIME TAKING CARE OF THIS PATIENT: 38 minutes.    Gladstone Lighter M.D on 07/30/2019 at 11:58 AM  Between 7am to 6pm - Pager - [phone]  After 6pm go to [email or web address] - Proofreader  [facility]  Office  [phone]  CC: Primary care physician; Dion Body, MD   Note: This dictation was prepared with Dragon dictation along with smaller phrase technology. Any transcriptional errors that result from this process are unintentional.

## 2019-07-30 NOTE — Progress Notes (Signed)
Anita Wells to be D/C'd patient's home per MD order.  Discussed prescriptions and follow up appointments with the patient. Prescriptions given to patient, medication list explained in detail. Pt verbalized understanding.  Allergies as of 07/30/2019       Reactions   Clopidogrel Swelling   Mouth swelling   Ace Inhibitors Cough   Amlodipine Other (See Comments)   Peripheral edema        Medication List     STOP taking these medications    cloNIDine 0.2 MG tablet Commonly known as: CATAPRES   hydrALAZINE 100 MG tablet Commonly known as: APRESOLINE   losartan 100 MG tablet Commonly known as: COZAAR   metoprolol tartrate 100 MG tablet Commonly known as: LOPRESSOR   torsemide 10 MG tablet Commonly known as: DEMADEX       TAKE these medications    aspirin EC 81 MG tablet Take 81 mg by mouth daily.   atorvastatin 40 MG tablet Commonly known as: LIPITOR Take 40 mg by mouth at bedtime.   calcitRIOL 0.25 MCG capsule Commonly known as: ROCALTROL Take 0.25 mcg by mouth every Monday, Wednesday, and Friday.   docusate sodium 100 MG capsule Commonly known as: COLACE Take 100 mg by mouth 2 (two) times daily.   escitalopram 10 MG tablet Commonly known as: LEXAPRO Take 10 mg by mouth daily.   ferrous sulfate 325 (65 FE) MG tablet Take 325 mg by mouth daily with breakfast.   fluticasone 50 MCG/ACT nasal spray Commonly known as: FLONASE Place 2 sprays into the nose daily.   furosemide 40 MG tablet Commonly known as: LASIX Take 40 mg by mouth daily. On non-dialysis days   levofloxacin 500 MG tablet Commonly known as: Levaquin Take 1 tablet (500 mg total) by mouth every other day for 4 days. Start taking on: July 31, 2019   mirtazapine 7.5 MG tablet Commonly known as: REMERON Take 7.5 mg by mouth at bedtime.   multivitamin Tabs tablet Take 1 tablet by mouth every Monday, Wednesday, and Friday.   omeprazole 40 MG capsule Commonly known as: PRILOSEC Take  40 mg by mouth daily.   sodium bicarbonate 325 MG tablet Take 325 mg by mouth 2 (two) times daily.               Durable Medical Equipment  (From admission, onward)           Start     Ordered   07/29/19 1234  For home use only DME oxygen  Once    Question Answer Comment  Length of Need 6 Months   Mode or (Route) Nasal cannula   Liters per Minute 2   Frequency Continuous (stationary and portable oxygen unit needed)   Oxygen conserving device Yes   Oxygen delivery system Gas      07/29/19 1233            Vitals:   07/30/19 1050 07/30/19 1230  BP:  (!) 167/64  Pulse:  (!) 101  Resp:  18  Temp:  98.3 F (36.8 C)  SpO2: 95% 95%    Skin clean, dry and intact without evidence of skin break down, no evidence of skin tears noted. IV catheter discontinued intact. Site without signs and symptoms of complications. Dressing and pressure applied. Pt denies pain at this time. No complaints noted.  An After Visit Summary was printed and given to the patient. Patient escorted via [place], and D/C home via private auto.  Anita Wells

## 2019-07-30 NOTE — TOC Transition Note (Signed)
Transition of Care [facility]) - CM/SW Discharge Note   Patient Details  Name: FELIZA FUHRIMAN MRN: [id number] Date of Birth: [birth year]  Transition of Care [facility]) CM/SW Contact:  Annamaria Boots, [place] Phone Number: 07/30/2019, 11:55 AM   Clinical Narrative:  Per [facility], they can provide oxygen for patient and can deliver today. CSW notified RN and MD of above. Patient should discharge home today with oxygen.        Barriers to Discharge: Equipment Delay   Patient Goals and CMS Choice        Discharge Placement                       Discharge Plan and Services                                     Social Determinants of Health (SDOH) Interventions     Readmission Risk Interventions No flowsheet data found.

## 2019-07-30 NOTE — Care Management Important Message (Signed)
Important Message  Patient Details  Name: Anita Wells MRN: [id number] Date of Birth: 1941/10/06   Medicare Important Message Given:  Yes     Dannette Barbara 07/30/2019, 11:08 AM

## 2019-07-30 NOTE — Progress Notes (Signed)
Established peritoneal dialysis patient known at [facility]. Patient's family transports to appointments. Please contact me directly with any dialysis placement concerns.  Elvera Bicker Dialysis Coordinator  [phone]

## 2019-07-30 NOTE — Progress Notes (Signed)
[facility] [place], [place] 07/30/19  Subjective:   LOS: 3 10/04 0701 - 10/05 0700 In: 240 [P.O.:240] Out: 100 [Urine:100] Patient's discharge yesterday was held because of continued requirement of oxygen Patient was hypoxic on room air with O2 sat of 81% with ambulation Today, she feels well.  Ate 100% of her breakfast No leg edema Still requiring minimal amount of oxygen Does not feel short of breath   Objective:  Vital signs in last 24 hours:  Temp:  [98 F (36.7 C)-98.4 F (36.9 C)] 98.4 F (36.9 C) (10/05 0439) Pulse Rate:  [77-86] 86 (10/05 0439) Resp:  [18-20] 18 (10/05 0439) BP: (133-159)/(47-55) 137/55 (10/05 0439) SpO2:  [96 %-98 %] 96 % (10/05 0439) Weight:  [56.7 kg] 56.7 kg (10/05 0500)  Weight change:  Filed Weights   07/27/19 1655 07/27/19 2253 07/30/19 0500  Weight: 56.7 kg 52.9 kg 56.7 kg    Intake/Output:    Intake/Output Summary (Last 24 hours) at 07/30/2019 0755 Last data filed at 07/30/2019 0512 Gross per 24 hour  Intake 240 ml  Output 100 ml  Net 140 ml     Physical Exam: General:  Alert, no acute distress, laying in the bed  HEENT  moist oral mucous membrane  Neck  supple no JVD  Pulm/lungs  O2 by [place], clear to auscultation bilaterally  CVS/Heart  regular, no rub  Abdomen:   Soft, nontender  Extremities:  No peripheral edema  Neurologic:  Alert, oriented  Skin:  Warm, dry  Access:  Right IJ PermCath, PD catheter in place       Basic Metabolic Panel:  Recent Labs  Lab 07/27/19 1653 07/28/19 0423 07/29/19 0549  NA 138 137 139  K 3.5 3.1* 3.5  CL 99 101 98  CO2 28 26 33*  GLUCOSE 118* 121* 146*  BUN 19 26* 17  CREATININE 2.57* 3.58* 3.35*  CALCIUM 8.4* 8.1* 8.2*     CBC: Recent Labs  Lab 07/27/19 1653 07/28/19 0423 07/29/19 0549  WBC 18.8* 12.4* 8.2  NEUTROABS 16.8*  --   --   HGB 7.6* 6.8* 8.5*  HCT 23.9* 21.4* 26.8*  MCV 98.0 98.6 97.8  PLT 225 186 191     No results found for:  HEPBSAG, HEPBSAB, HEPBIGM    Microbiology:  Recent Results (from the past 240 hour(s))  SARS Coronavirus 2 [facility] order, Performed in [place] hospital lab) Nasopharyngeal Nasopharyngeal Swab     Status: None   Collection Time: 07/27/19  4:53 PM   Specimen: Nasopharyngeal Swab  Result Value Ref Range Status   SARS Coronavirus 2 NEGATIVE NEGATIVE Final    Comment: (NOTE) If result is NEGATIVE SARS-CoV-2 target nucleic acids are NOT DETECTED. The SARS-CoV-2 RNA is generally detectable in upper and lower  respiratory specimens during the acute phase of infection. The lowest  concentration of SARS-CoV-2 viral copies this assay can detect is 250  copies / mL. A negative result does not preclude SARS-CoV-2 infection  and should not be used as the sole basis for treatment or other  patient management decisions.  A negative result may occur with  improper specimen collection / handling, submission of specimen other  than nasopharyngeal swab, presence of viral mutation(s) within the  areas targeted by this assay, and inadequate number of viral copies  (<250 copies / mL). A negative result must be combined with clinical  observations, patient history, and epidemiological information. If result is POSITIVE SARS-CoV-2 target nucleic acids are DETECTED. The  SARS-CoV-2 RNA is generally detectable in upper and lower  respiratory specimens dur ing the acute phase of infection.  Positive  results are indicative of active infection with SARS-CoV-2.  Clinical  correlation with patient history and other diagnostic information is  necessary to determine patient infection status.  Positive results do  not rule out bacterial infection or co-infection with other viruses. If result is PRESUMPTIVE POSTIVE SARS-CoV-2 nucleic acids MAY BE PRESENT.   A presumptive positive result was obtained on the submitted specimen  and confirmed on repeat testing.  While 2019 novel coronavirus  (SARS-CoV-2)  nucleic acids may be present in the submitted sample  additional confirmatory testing may be necessary for epidemiological  and / or clinical management purposes  to differentiate between  SARS-CoV-2 and other Sarbecovirus currently known to infect humans.  If clinically indicated additional testing with an alternate test  methodology [phone]) is advised. The SARS-CoV-2 RNA is generally  detectable in upper and lower respiratory sp ecimens during the acute  phase of infection. The expected result is Negative. Fact Sheet for Patients:  StrictlyIdeas.no Fact Sheet for Healthcare Providers: BankingDealers.co.za This test is not yet approved or cleared by the [place] FDA and has been authorized for detection and/or diagnosis of SARS-CoV-2 by FDA under an Emergency Use Authorization (EUA).  This EUA will remain in effect (meaning this test can be used) for the duration of the COVID-19 declaration under Section 564(b)(1) of the Act, 21 U.S.C. section 360bbb-3(b)(1), unless the authorization is terminated or revoked sooner. Performed at [facility], [address] [place], [place] [address]   MRSA PCR Screening     Status: None   Collection Time: 07/27/19 11:19 PM   Specimen: Nasal Mucosa; Nasopharyngeal  Result Value Ref Range Status   MRSA by PCR NEGATIVE NEGATIVE Final    Comment:        The GeneXpert MRSA Assay (FDA approved for NASAL specimens only), is one component of a comprehensive MRSA colonization surveillance program. It is not intended to diagnose MRSA infection nor to guide or monitor treatment for MRSA infections. Performed at [facility], [address] [place], [place] [address]     Coagulation Studies: No results for input(s): LABPROT, INR in the last 72 hours.  Urinalysis: No results for input(s): COLORURINE, LABSPEC, PHURINE, GLUCOSEU, HGBUR, BILIRUBINUR, KETONESUR, PROTEINUR,  UROBILINOGEN, NITRITE, LEUKOCYTESUR in the last 72 hours.  Invalid input(s): APPERANCEUR    Imaging: Dg Chest 2 View  Result Date: 07/29/2019 CLINICAL DATA:  Pulmonary edema.  Shortness of breath. EXAM: CHEST - 2 VIEW COMPARISON:  07/27/2019 FINDINGS: Right IJ central venous catheter unchanged. Lungs are adequately inflated with persistent mixed interstitial airspace density over the right mid to upper lung with slightly improved aeration. Left lung essentially clear. No effusion or pneumothorax. Cardiomediastinal silhouette and remainder of the exam is unchanged. IMPRESSION: Persistent mixed interstitial airspace density over the right mid to upper lung with slightly improved aeration which may be due to ongoing infection versus asymmetric edema. Electronically Signed   By: Marin Olp M.D.   On: 07/29/2019 09:57     Medications:    . sodium chloride   Intravenous Once  . aspirin EC  81 mg Oral Daily  . atorvastatin  40 mg Oral q1800  . Chlorhexidine Gluconate Cloth  6 each Topical Q0600  . docusate sodium  100 mg Oral BID  . escitalopram  10 mg Oral Daily  . ferrous sulfate  325 mg Oral Q breakfast  .  heparin  5,000 Units Subcutaneous Q8H  . mirtazapine  7.5 mg Oral QHS  . multivitamin  1 tablet Oral 3 times weekly   acetaminophen **OR** acetaminophen, bisacodyl, HYDROcodone-acetaminophen, ondansetron **OR** ondansetron (ZOFRAN) IV, traZODone  Assessment/ Plan:  78 y.o. African-American female with end-stage renal disease on hemodialysis, hypertension, renal artery stenosis, GERD, hyperlipidemia Admitted for acute shortness of breath  Active Problems:   Shortness of breath   #.  End-stage renal disease -Patient is transitioning to peritoneal dialysis but is requiring backup hemodialysis for volume overload -Patient was dialyzed on October 2, and 2 L of fluid was removed. -Volume status and blood pressure are acceptable today.  No acute indication for dialysis at the  moment  # Acute respiratory failure from pulmonary edema -Chest x-ray reports asymmetric pulmonary edema versus infection with findings more on the right side -Outpatient 2D echo from February 2020 shows normal left ventricular and right ventricular systolic function with EF greater than 55%.  Moderate pulmonary hypertension, moderate tricuspid regurgitation and moderate LVH - d/w nurse to check ambulatory sats  - may need home O2 and outpatietn pu  #. Anemia of CKD  Lab Results  Component Value Date   HGB 8.5 (L) 07/29/2019  -Received blood transfusion this admission - getting 26,000 units of Epogen every other week as outpatient  #. SHPTH Monitor phos     LOS: [place] 10/5/20207:55 AM  [place], [place]

## 2019-08-29 ENCOUNTER — Emergency Department
Admission: EM | Admit: 2019-08-29 | Discharge: 2019-08-30 | Disposition: A | Discharge status: Home / Self Care | Payer: Medicare Other | Attending: Emergency Medicine | Admitting: Emergency Medicine

## 2019-08-29 ENCOUNTER — Other Ambulatory Visit: Payer: Self-pay

## 2019-08-29 ENCOUNTER — Encounter: Payer: Self-pay | Admitting: Emergency Medicine

## 2019-08-29 ENCOUNTER — Other Ambulatory Visit: Payer: Self-pay | Admitting: Nephrology

## 2019-08-29 DIAGNOSIS — F17210 Nicotine dependence, cigarettes, uncomplicated: Secondary | ICD-10-CM | POA: Insufficient documentation

## 2019-08-29 DIAGNOSIS — D631 Anemia in chronic kidney disease: Secondary | ICD-10-CM

## 2019-08-29 DIAGNOSIS — N185 Chronic kidney disease, stage 5: Secondary | ICD-10-CM | POA: Diagnosis not present

## 2019-08-29 DIAGNOSIS — D649 Anemia, unspecified: Secondary | ICD-10-CM

## 2019-08-29 DIAGNOSIS — Z7982 Long term (current) use of aspirin: Secondary | ICD-10-CM | POA: Diagnosis not present

## 2019-08-29 DIAGNOSIS — I120 Hypertensive chronic kidney disease with stage 5 chronic kidney disease or end stage renal disease: Secondary | ICD-10-CM | POA: Insufficient documentation

## 2019-08-29 DIAGNOSIS — M6281 Muscle weakness (generalized): Secondary | ICD-10-CM | POA: Diagnosis present

## 2019-08-29 DIAGNOSIS — Z992 Dependence on renal dialysis: Secondary | ICD-10-CM

## 2019-08-29 LAB — COMPREHENSIVE METABOLIC PANEL
ALT: 23 U/L (ref 0–44)
AST: 27 U/L (ref 15–41)
Albumin: 3 g/dL — ABNORMAL LOW (ref 3.5–5.0)
Alkaline Phosphatase: 57 U/L (ref 38–126)
Anion gap: 14 (ref 5–15)
BUN: 40 mg/dL — ABNORMAL HIGH (ref 8–23)
CO2: 28 mmol/L (ref 22–32)
Calcium: 8.6 mg/dL — ABNORMAL LOW (ref 8.9–10.3)
Chloride: 98 mmol/L (ref 98–111)
Creatinine, Ser: 5.05 mg/dL — ABNORMAL HIGH (ref 0.44–1.00)
GFR calc Af Amer: 9 mL/min — ABNORMAL LOW (ref >60)
GFR calc non Af Amer: 8 mL/min — ABNORMAL LOW (ref >60)
Glucose, Bld: 118 mg/dL — ABNORMAL HIGH (ref 70–99)
Potassium: 3.4 mmol/L — ABNORMAL LOW (ref 3.5–5.1)
Sodium: 140 mmol/L (ref 135–145)
Total Bilirubin: 0.5 mg/dL (ref 0.3–1.2)
Total Protein: 6.2 g/dL — ABNORMAL LOW (ref 6.5–8.1)

## 2019-08-29 LAB — CBC
HCT: 22.6 % — ABNORMAL LOW (ref 36.0–46.0)
Hemoglobin: 6.9 g/dL — ABNORMAL LOW (ref 12.0–15.0)
MCH: 30.9 pg (ref 26.0–34.0)
MCHC: 30.5 g/dL (ref 30.0–36.0)
MCV: 101.3 fL — ABNORMAL HIGH (ref 80.0–100.0)
Platelets: 204 10*3/uL (ref 150–400)
RBC: 2.23 MIL/uL — ABNORMAL LOW (ref 3.87–5.11)
RDW: 14.5 % (ref 11.5–15.5)
WBC: 5.1 10*3/uL (ref 4.0–10.5)
nRBC: 0 % (ref 0.0–0.2)

## 2019-08-29 LAB — PREPARE RBC (CROSSMATCH)

## 2019-08-29 MED ORDER — SODIUM CHLORIDE 0.9 % IV SOLN: 10.0000 mL/h | Freq: Once | INTRAVENOUS | Status: DC

## 2019-08-29 NOTE — ED Notes (Signed)
Blood transfusion stared at 2112. Denton Ar RN verified blood with this Probation officer. Patient signed consents for blood admin.

## 2019-08-29 NOTE — ED Notes (Signed)
This Probation officer in with [place] for rectal exam.

## 2019-08-29 NOTE — ED Provider Notes (Signed)
[facility] Emergency Department Provider Note  ____________________________________________  Time seen: Approximately 8:37 PM  I have reviewed the triage vital signs and the nursing notes.   HISTORY  Chief Complaint Blood Transfusion    HPI Anita Wells is a 78 y.o. female with a history of end-stage renal disease on peritoneal dialysis, hypertension, GERD, chronic anemia who was sent to the ED by her nephrologist due to worsening anemia.  Patient has a history of needing a blood transfusion a month ago for symptomatic anemia.  She denies any black or bloody stool or hematemesis.  She has generalized weakness, worse with standing, no alleviating factors.  Has intermittent lightheadedness.  Denies chest pain shortness of breath belly pain fevers chills sweats falls or head trauma.  Does not take blood thinners.  She does still make urine and has not noticed any changes in her urine output.  She is taking her Lasix as well.   She has not noticed any increased weight gain     Past Medical History:  Diagnosis Date  . Anemia   . Barrett's esophagus   . Carotid stenosis, bilateral   . Chronic kidney disease   . GERD (gastroesophageal reflux disease)   . Hyperlipidemia   . Hyperparathyroidism ([place])   . Hypertension   . Renal artery stenosis [facility])    left     Patient Active Problem List   Diagnosis Date Noted  . Shortness of breath 07/27/2019  . ESRD on dialysis ([place]) 11/22/2018  . Essential hypertension 11/22/2018  . Hyperlipidemia 11/22/2018  . Anemia due to chronic kidney disease 11/12/2018     Past Surgical History:  Procedure Laterality Date  . A/V FISTULAGRAM Left 02/19/2019   Procedure: A/V FISTULAGRAM;  Surgeon: Algernon Huxley, MD;  Location: [facility];  Service: Cardiovascular;  Laterality: Left;  . AV FISTULA PLACEMENT  12/21/2018   Procedure: ARTERIOVENOUS (AV) FISTULA CREATION;  Surgeon: Algernon Huxley, MD;  Location: [facility] ORS;   Service: Vascular;;  . COLON RESECTION    . COLON SURGERY    . colonoscpy    . DIALYSIS/PERMA CATHETER INSERTION N/A 11/15/2018   Procedure: DIALYSIS/PERMA CATHETER INSERTION;  Surgeon: Algernon Huxley, MD;  Location: [facility];  Service: Cardiovascular;  Laterality: N/A;  . RENAL ARTERY STENT Left      Prior to Admission medications   Medication Sig Start Date End Date Taking? Authorizing Provider  aspirin EC 81 MG tablet Take 81 mg by mouth daily.    [provider]  atorvastatin (LIPITOR) 40 MG tablet Take 40 mg by mouth at bedtime.     [provider]  calcitRIOL (ROCALTROL) 0.25 MCG capsule Take 0.25 mcg by mouth every Monday, Wednesday, and Friday.     [provider]  docusate sodium (COLACE) 100 MG capsule Take 100 mg by mouth 2 (two) times daily.    [provider]  escitalopram (LEXAPRO) 10 MG tablet Take 10 mg by mouth daily.    [provider]  ferrous sulfate 325 (65 FE) MG tablet Take 325 mg by mouth daily with breakfast.    [provider]  fluticasone (FLONASE) 50 MCG/ACT nasal spray Place 2 sprays into the nose daily.    [provider]  furosemide (LASIX) 40 MG tablet Take 40 mg by mouth daily. On non-dialysis days    [provider]  mirtazapine (REMERON) 7.5 MG tablet Take 7.5 mg by mouth at bedtime.    [provider]  multivitamin (RENA-VIT) TABS tablet Take 1 tablet by mouth every Monday, Wednesday, and Friday.     [provider]  omeprazole (PRILOSEC) 40 MG capsule Take 40 mg by mouth daily.    [provider]  sodium bicarbonate 325 MG tablet Take 325 mg by mouth 2 (two) times daily.    [provider]     Allergies Clopidogrel, Ace inhibitors, and Amlodipine   No family history on file.  Social History Social History   Tobacco Use  . Smoking status: Current Every Day Smoker    Packs/day: 0.25    Years: 50.00    Pack years: 12.50     Types: Cigarettes  . Smokeless tobacco: Never Used  Substance Use Topics  . Alcohol use: Not Currently  . Drug use: Never    Review of Systems  Constitutional:   No fever or chills.  ENT:   No sore throat. No rhinorrhea. Cardiovascular:   No chest pain or syncope. Respiratory:   No dyspnea or cough. Gastrointestinal:   Negative for abdominal pain, vomiting and diarrhea.  Musculoskeletal:   Chronic peripheral edema All other systems reviewed and are negative except as documented above in ROS and HPI.  ____________________________________________   PHYSICAL EXAM:  VITAL SIGNS: ED Triage Vitals  Enc Vitals Group     BP 08/29/19 1615 (!) 129/39     Pulse Rate 08/29/19 1615 72     Resp 08/29/19 1615 16     Temp 08/29/19 1615 98.5 F (36.9 C)     Temp Source 08/29/19 1615 Oral     SpO2 08/29/19 1615 100 %     Weight 08/29/19 1616 125 lb (56.7 kg)     Height 08/29/19 1616 5\' 6"  (1.676 m)     Head Circumference --      Peak Flow --      Pain Score 08/29/19 1616 5     Pain Loc --      Pain Edu? --      Excl. in [place]? --     Vital signs reviewed, nursing assessments reviewed.   Constitutional:   Alert and oriented. Non-toxic appearance. Eyes:   Conjunctivae are normal. EOMI. PERRL. ENT      Head:   Normocephalic and atraumatic.      Nose:   Wearing a mask.      Mouth/Throat:   Wearing a mask.      Neck:   No meningismus. Full ROM. Hematological/Lymphatic/Immunilogical:   No cervical lymphadenopathy. Cardiovascular:   RRR. Symmetric bilateral radial and DP pulses.  No murmurs. Cap refill less than 2 seconds. Respiratory:   Normal respiratory effort without tachypnea/retractions. Breath sounds are clear and equal bilaterally. No wheezes/rales/rhonchi. Gastrointestinal:   Soft and nontender. Non distended. There is no CVA tenderness.  No rebound, rigidity, or guarding.  Rectal exam shows brown stool, no fecal impaction.  Hemoccult negative.  Performed with nurse at  bedside  Musculoskeletal:   Normal range of motion in all extremities. No joint effusions.  No lower extremity tenderness.  No edema. Neurologic:   Normal speech and language.  Motor grossly intact. No acute focal neurologic deficits are appreciated.  Skin:    Skin is warm, dry and intact. No rash noted.  No petechiae, purpura, or bullae.  ____________________________________________    LABS (pertinent positives/negatives) (all labs ordered are listed, but only abnormal results are displayed) Labs Reviewed  COMPREHENSIVE METABOLIC PANEL - Abnormal; Notable for the following components:  Result Value   Potassium 3.4 (*)    Glucose, Bld 118 (*)    BUN 40 (*)    Creatinine, Ser 5.05 (*)    Calcium 8.6 (*)    Total Protein 6.2 (*)    Albumin 3.0 (*)    GFR calc non Af Amer 8 (*)    GFR calc Af Amer 9 (*)    All other components within normal limits  CBC - Abnormal; Notable for the following components:   RBC 2.23 (*)    Hemoglobin 6.9 (*)    HCT 22.6 (*)    MCV 101.3 (*)    All other components within normal limits  TYPE AND SCREEN  PREPARE RBC (CROSSMATCH)   ____________________________________________   EKG    ____________________________________________    RADIOLOGY  No results found.  ____________________________________________   PROCEDURES .Critical Care Performed by: Carrie Mew, MD Authorized by: Carrie Mew, MD   Critical care provider statement:    Critical care time (minutes):  32   Critical care time was exclusive of:  Separately billable procedures and treating other patients   Critical care was necessary to treat or prevent imminent or life-threatening deterioration of the following conditions:  Circulatory failure and shock   Critical care was time spent personally by me on the following activities:  Development of treatment plan with patient or surrogate, discussions with consultants, evaluation of patient's response to  treatment, examination of patient, obtaining history from patient or surrogate, ordering and performing treatments and interventions, ordering and review of laboratory studies, ordering and review of radiographic studies, pulse oximetry, re-evaluation of patient's condition and review of old charts    ____________________________________________    CLINICAL IMPRESSION / [place] / ED COURSE  Medications ordered in the ED: Medications  0.9 %  sodium chloride infusion (has no administration in time range)    Pertinent labs & imaging results that were available during my care of the patient were reviewed by me and considered in my medical decision making (see chart for details).  REYANNE OLDT was evaluated in Emergency Department on 08/29/2019 for the symptoms described in the history of present illness. She was evaluated in the context of the global COVID-19 pandemic, which necessitated consideration that the patient might be at risk for infection with the SARS-CoV-2 virus that causes COVID-19. Institutional protocols and algorithms that pertain to the evaluation of patients at risk for COVID-19 are in a state of rapid change based on information released by regulatory bodies including the CDC and federal and state organizations. These policies and algorithms were followed during the patient's care in the ED.   Patient with end-stage renal disease comes the ED with symptomatic anemia, hemoglobin 6.9 today decreased from a baseline of about 8.5.  This is a recurrent issue for her, requiring transfusion in the past.  No other new symptoms.  Doubt ACS PE dissection AAA pneumothorax pericarditis mesenteric ischemia AAA or intra-abdominal pathology.  Doubt SBP or other infection.  Discussed with nephrology Dr. Zollie Scale who agrees with transfusing 1 unit and then discharging home to follow-up outpatient.  Patient is comfortable with this plan as well.  No signs of any acute blood loss, the anemia  appears to be due to her renal failure and decreased synthesis.      ____________________________________________   FINAL CLINICAL IMPRESSION(S) / ED DIAGNOSES    Final diagnoses:  Chronic renal failure, stage 5 (HCC)  Symptomatic anemia     ED Discharge Orders  None      Portions of this note were generated with dragon dictation software. Dictation errors may occur despite best attempts at proofreading.   Carrie Mew, MD 08/29/19 2042

## 2019-08-29 NOTE — ED Triage Notes (Signed)
Pt in via POV, sent from Nephrologist for blood transfusion.  Pt reports hx of Anemia.  Denies any symptoms.  NAD noted at this time.

## 2019-08-30 LAB — TYPE AND SCREEN
ABO/RH(D): B POS
Antibody Screen: NEGATIVE
Unit division: 0

## 2019-08-30 LAB — BPAM RBC
Blood Product Expiration Date: 202011212359
ISSUE DATE / TIME: 202011042057
Unit Type and Rh: 1700

## 2019-08-30 NOTE — ED Notes (Signed)
Transfusion complete at [place]. Patient has no complaints of pain or adverse reactions to transfusion.

## 2019-09-06 ENCOUNTER — Telehealth (INDEPENDENT_AMBULATORY_CARE_PROVIDER_SITE_OTHER): Payer: Self-pay

## 2019-09-06 NOTE — Telephone Encounter (Signed)
Spoke with the patient and she is now scheduled with  Dr. Lucky Cowboy for permcath removal on 09/11/2019 with a 9:15 am arrival time to the [facility]. Patient will do her Covid testing on 09/11/2019 between 12:30-2:30 pm at the [facility]. Pre-procedure instructions were discussed and will be mailed to the patient.

## 2019-09-11 ENCOUNTER — Other Ambulatory Visit (INDEPENDENT_AMBULATORY_CARE_PROVIDER_SITE_OTHER): Payer: Self-pay | Admitting: Nurse Practitioner

## 2019-09-11 ENCOUNTER — Other Ambulatory Visit: Admission: RE | Admit: 2019-09-11 | Payer: Medicare Other | Source: Ambulatory Visit

## 2019-09-12 ENCOUNTER — Encounter (INDEPENDENT_AMBULATORY_CARE_PROVIDER_SITE_OTHER): Payer: Self-pay

## 2019-09-24 ENCOUNTER — Other Ambulatory Visit: Payer: Self-pay

## 2019-09-24 ENCOUNTER — Other Ambulatory Visit
Admission: RE | Admit: 2019-09-24 | Discharge: 2019-09-24 | Disposition: A | Discharge status: Home / Self Care | Payer: Medicare Other | Source: Ambulatory Visit | Attending: Vascular Surgery | Admitting: Vascular Surgery

## 2019-09-24 DIAGNOSIS — Z01812 Encounter for preprocedural laboratory examination: Secondary | ICD-10-CM | POA: Insufficient documentation

## 2019-09-24 DIAGNOSIS — Z20828 Contact with and (suspected) exposure to other viral communicable diseases: Secondary | ICD-10-CM | POA: Diagnosis not present

## 2019-09-25 LAB — SARS CORONAVIRUS 2 (TAT 6-24 HRS): SARS Coronavirus 2: NEGATIVE

## 2019-09-27 ENCOUNTER — Encounter: Payer: Self-pay | Admitting: Vascular Surgery

## 2019-09-27 ENCOUNTER — Encounter
Admission: RE | Disposition: A | Discharge status: Home / Self Care | Payer: Self-pay | Source: Ambulatory Visit | Attending: Vascular Surgery

## 2019-09-27 ENCOUNTER — Ambulatory Visit
Admission: RE | Admit: 2019-09-27 | Discharge: 2019-09-27 | Disposition: A | Discharge status: Home / Self Care | Payer: Medicare Other | Source: Ambulatory Visit | Attending: Vascular Surgery | Admitting: Vascular Surgery

## 2019-09-27 ENCOUNTER — Other Ambulatory Visit: Payer: Self-pay

## 2019-09-27 DIAGNOSIS — I6523 Occlusion and stenosis of bilateral carotid arteries: Secondary | ICD-10-CM | POA: Diagnosis not present

## 2019-09-27 DIAGNOSIS — N186 End stage renal disease: Secondary | ICD-10-CM | POA: Insufficient documentation

## 2019-09-27 DIAGNOSIS — Z4901 Encounter for fitting and adjustment of extracorporeal dialysis catheter: Secondary | ICD-10-CM | POA: Insufficient documentation

## 2019-09-27 DIAGNOSIS — E785 Hyperlipidemia, unspecified: Secondary | ICD-10-CM | POA: Diagnosis not present

## 2019-09-27 DIAGNOSIS — Z8719 Personal history of other diseases of the digestive system: Secondary | ICD-10-CM | POA: Insufficient documentation

## 2019-09-27 DIAGNOSIS — E213 Hyperparathyroidism, unspecified: Secondary | ICD-10-CM | POA: Diagnosis not present

## 2019-09-27 DIAGNOSIS — F17210 Nicotine dependence, cigarettes, uncomplicated: Secondary | ICD-10-CM | POA: Insufficient documentation

## 2019-09-27 DIAGNOSIS — I120 Hypertensive chronic kidney disease with stage 5 chronic kidney disease or end stage renal disease: Secondary | ICD-10-CM | POA: Diagnosis not present

## 2019-09-27 DIAGNOSIS — Z992 Dependence on renal dialysis: Secondary | ICD-10-CM

## 2019-09-27 DIAGNOSIS — K219 Gastro-esophageal reflux disease without esophagitis: Secondary | ICD-10-CM | POA: Diagnosis not present

## 2019-09-27 HISTORY — PX: DIALYSIS/PERMA CATHETER REMOVAL: CATH118289

## 2019-09-27 SURGERY — DIALYSIS/PERMA CATHETER REMOVAL
Anesthesia: LOCAL

## 2019-09-27 MED ORDER — LIDOCAINE-EPINEPHRINE (PF) 1 %-1:200000 IJ SOLN
INTRAMUSCULAR | Status: DC | PRN
  Administered 2019-09-27: 20 mL via INTRADERMAL

## 2019-09-27 SURGICAL SUPPLY — 2 items
FORCEPS HALSTEAD CVD 5IN STRL (INSTRUMENTS) ×2 IMPLANT
TRAY LACERAT/PLASTIC (MISCELLANEOUS) ×2 IMPLANT

## 2019-09-27 NOTE — H&P (Signed)
[facility] Admission History & Physical  MRN : [id number]  Anita Wells is a 78 y.o. (1941-01-12) female who presents with chief complaint of No chief complaint on file. Marland Kitchen  History of Present Illness: I am asked to evaluate the patient by the dialysis center. The patient was sent here because they have a nonfunctioning tunneled catheter and a functioning left brachiocephalic AVF.  The patient reports they're not been any problems with any of their dialysis runs. They are reporting good flows with good parameters at dialysis.  Patient denies pain or tenderness overlying the access.  There is no pain with dialysis.  The patient denies hand pain or finger pain consistent with steal syndrome.  No fevers or chills while on dialysis.   No current facility-administered medications for this encounter.     Past Medical History:  Diagnosis Date  . Anemia   . Barrett's esophagus   . Carotid stenosis, bilateral   . Chronic kidney disease   . GERD (gastroesophageal reflux disease)   . Hyperlipidemia   . Hyperparathyroidism ([place])   . Hypertension   . Renal artery stenosis (HCC)    left    Past Surgical History:  Procedure Laterality Date  . A/V FISTULAGRAM Left 02/19/2019   Procedure: A/V FISTULAGRAM;  Surgeon: Algernon Huxley, MD;  Location: [facility];  Service: Cardiovascular;  Laterality: Left;  . AV FISTULA PLACEMENT  12/21/2018   Procedure: ARTERIOVENOUS (AV) FISTULA CREATION;  Surgeon: Algernon Huxley, MD;  Location: [facility];  Service: Vascular;;  . COLON RESECTION    . COLON SURGERY    . colonoscpy    . DIALYSIS/PERMA CATHETER INSERTION N/A 11/15/2018   Procedure: DIALYSIS/PERMA CATHETER INSERTION;  Surgeon: Algernon Huxley, MD;  Location: [facility] [facility] [facility];  Service: Cardiovascular;  Laterality: N/A;  . RENAL ARTERY STENT Left      Social History   Tobacco Use  . Smoking status: Current Every Day Smoker    Packs/day: 0.25    Years: 50.00   Pack years: 12.50    Types: Cigarettes  . Smokeless tobacco: Never Used  Substance Use Topics  . Alcohol use: Not Currently  . Drug use: Never    Family History   No family history of bleeding or clotting disorders, autoimmune disease or porphyria  Allergies  Allergen Reactions  . Clopidogrel Swelling    Mouth swelling  . Ace Inhibitors Cough  . Amlodipine Other (See Comments)    Peripheral edema     REVIEW OF SYSTEMS (Negative unless checked)  Constitutional: [] Weight loss  [] Fever  [] Chills Cardiac: [] Chest pain   [] Chest pressure   [] Palpitations   [] Shortness of breath when laying flat   [] Shortness of breath at rest   [x] Shortness of breath with exertion. Vascular:  [] Pain in legs with walking   [] Pain in legs at rest   [] Pain in legs when laying flat   [] Claudication   [] Pain in feet when walking  [] Pain in feet at rest  [] Pain in feet when laying flat   [] History of DVT   [] Phlebitis   [] Swelling in legs   [] Varicose veins   [] Non-healing ulcers Pulmonary:   [] Uses home oxygen   [] Productive cough   [] Hemoptysis   [] Wheeze  [] COPD   [] Asthma Neurologic:  [] Dizziness  [] Blackouts   [] Seizures   [] History of stroke   [] History of TIA  [] Aphasia   [] Temporary blindness   [] Dysphagia   [] Weakness or numbness in  arms   [] Weakness or numbness in legs Musculoskeletal:  [x] Arthritis   [] Joint swelling   [] Joint pain   [] Low back pain Hematologic:  [x] Easy bruising  [] Easy bleeding   [] Hypercoagulable state   [x] Anemic  [] Hepatitis Gastrointestinal:  [] Blood in stool   [] Vomiting blood  [] Gastroesophageal reflux/heartburn   [] Difficulty swallowing. Genitourinary:  [x] Chronic kidney disease   [] Difficult urination  [] Frequent urination  [] Burning with urination   [] Blood in urine Skin:  [] Rashes   [] Ulcers   [] Wounds Psychological:  [] History of anxiety   []  History of major depression.  Physical Examination  There were no vitals filed for this visit. There is no height or weight  on file to calculate BMI. Gen: WD/WN, NAD Head: [place]/AT, No temporalis wasting. Prominent temp pulse not noted. Ear/Nose/Throat: Hearing grossly intact, nares w/o erythema or drainage, oropharynx w/o Erythema/Exudate,  Eyes: Conjunctiva clear, sclera non-icteric Neck: Trachea midline.  No JVD.  Pulmonary:  Good air movement, respirations not labored, no use of accessory muscles.  Cardiac: RRR, normal S1, S2. Vascular: good thrill in access left arm Vessel Right Left  Radial Palpable Palpable   Musculoskeletal: M/S 5/5 throughout.  Extremities without ischemic changes.  No deformity or atrophy.  Neurologic: Sensation grossly intact in extremities.  Symmetrical.  Speech is fluent. Motor exam as listed above. Psychiatric: Judgment intact, Mood & affect appropriate for pt's clinical situation. Dermatologic: No rashes or ulcers noted.  No cellulitis or open wounds.    CBC Lab Results  Component Value Date   WBC 5.1 08/29/2019   HGB 6.9 (L) 08/29/2019   HCT 22.6 (L) 08/29/2019   MCV 101.3 (H) 08/29/2019   PLT 204 08/29/2019    BMET    Component Value Date/Time   NA 140 08/29/2019 1626   K 3.4 (L) 08/29/2019 1626   CL 98 08/29/2019 1626   CO2 28 08/29/2019 1626   GLUCOSE 118 (H) 08/29/2019 1626   BUN 40 (H) 08/29/2019 1626   CREATININE 5.05 (H) 08/29/2019 1626   CALCIUM 8.6 (L) 08/29/2019 1626   GFRNONAA 8 (L) 08/29/2019 1626   GFRAA 9 (L) 08/29/2019 1626   CrCl cannot be calculated (Patient's most recent lab result is older than the maximum 21 days allowed.).  COAG Lab Results  Component Value Date   INR 0.8 12/18/2018    Radiology No results found.  Assessment/Plan 1.  Complication dialysis device:  Patient's Tunneled catheter is not being used. The patient has an extremity access that is functioning well. Therefore, the patient will undergo removal of the tunneled catheter under local anesthesia.  The risks and benefits were described to the patient.  All questions  were answered.  The patient agrees to proceed with angiography and intervention. Potassium will be drawn to ensure that it is an appropriate level prior to performing intervention. 2.  End-stage renal disease requiring hemodialysis:  Patient will continue dialysis therapy without further interruption  3.  Hypertension:  Patient will continue medical management; nephrology is following no changes in oral medications.     Leotis Pain, MD  09/27/2019 10:07 AM

## 2019-09-27 NOTE — Discharge Instructions (Signed)
Tunneled Catheter Removal, Care After °Refer to this sheet in the next few weeks. These instructions provide you with information about caring for yourself after your procedure. Your health care provider may also give you more specific instructions. Your treatment has been planned according to current medical practices, but problems sometimes occur. Call your health care provider if you have any problems or questions after your procedure. °What can I expect after the procedure? °After the procedure, it is common to have: °· Some mild redness, swelling, and pain around your catheter site. ° ° °Follow these instructions at home: °Incision care  °· Check your removal site  every day for signs of infection. Check for: °¨ More redness, swelling, or pain. °¨ More fluid or blood. °¨ Warmth. °¨ Pus or a bad smell. °· Follow instructions from your health care provider about how to take care of your removal site. Make sure you: °¨ Wash your hands with soap and water before you change your bandages (dressings). If soap and water are not available, use hand sanitizer. °Activity  °· Return to your normal activities as told by your health care provider. Ask your health care provider what activities are safe for you. °· Do not lift anything that is heavier than 10 lb (4.5 kg) for 3 weeks or as long as told by your health care provider. ° °Contact a health care provider if: °· You have more fluid or blood coming from your removal site °· You have more redness, swelling, or pain at your incisions or around the area where your catheter was removed °· Your removal site feel warm to the touch. °· You feel unusually weak. °· You feel nauseous.. °· Get help right away if °· You have swelling in your arm, shoulder, neck, or face. °· You develop chest pain. °· You have difficulty breathing. °· You feel dizzy or light-headed. °· You have pus or a bad smell coming from your removal site °· You have a fever. °· You develop bleeding from your  removal site, and your bleeding does not stop. °This information is not intended to replace advice given to you by your health care provider. Make sure you discuss any questions you have with your health care provider. °Document Released: 09/27/2012 Document Revised: 06/13/2016 Document Reviewed: 07/07/2015 °Elsevier Interactive Patient Education © 2017 [employer] ° °

## 2019-09-27 NOTE — Op Note (Signed)
Operative Note     Preoperative diagnosis:   1. ESRD with functional permanent access  Postoperative diagnosis:  1. ESRD with functional permanent access  Procedure:  Removal of right jugular Permcath  Surgeon:  Leotis Pain, MD  Anesthesia:  Local  Assistant: Hezzie Bump, PA-C  EBL:  Minimal  Indication for the Procedure:  The patient has a functional permanent dialysis access and no longer needs their permcath.  This can be removed.  Risks and benefits are discussed and informed consent is obtained. An assistant was present during the procedure to help facilitate the exposure and expedite the procedure.  Description of the Procedure:  The patient's right neck, chest and existing catheter were sterilely prepped and draped. The assistant provided retraction and mobilization to help facilitate exposure and expedite the procedure throughout the entire procedure.  This included using retractors and optimizing lighting. The area around the catheter was anesthetized copiously with 1% lidocaine. The catheter was dissected out with curved hemostats until the cuff was freed from the surrounding fibrous sheath. The fiber sheath was transected, and the catheter was then removed in its entirety using gentle traction. Pressure was held and sterile dressings were placed. The patient tolerated the procedure well and was taken to the recovery room in stable condition.     Leotis Pain  09/27/2019, 12:08 PM This note was created with Dragon Medical transcription system. Any errors in dictation are purely unintentional.

## 2019-12-11 ENCOUNTER — Other Ambulatory Visit: Payer: Self-pay

## 2019-12-11 ENCOUNTER — Inpatient Hospital Stay
Admission: EM | Admit: 2019-12-11 | Discharge: 2019-12-14 | DRG: 637 | Disposition: A | Discharge status: Home / Self Care | Payer: Medicare Other | Attending: Internal Medicine | Admitting: Internal Medicine

## 2019-12-11 ENCOUNTER — Emergency Department: Payer: Medicare Other

## 2019-12-11 DIAGNOSIS — Z79899 Other long term (current) drug therapy: Secondary | ICD-10-CM | POA: Diagnosis not present

## 2019-12-11 DIAGNOSIS — E871 Hypo-osmolality and hyponatremia: Secondary | ICD-10-CM | POA: Diagnosis present

## 2019-12-11 DIAGNOSIS — I10 Essential (primary) hypertension: Secondary | ICD-10-CM | POA: Diagnosis present

## 2019-12-11 DIAGNOSIS — E1122 Type 2 diabetes mellitus with diabetic chronic kidney disease: Secondary | ICD-10-CM | POA: Diagnosis present

## 2019-12-11 DIAGNOSIS — Z992 Dependence on renal dialysis: Secondary | ICD-10-CM

## 2019-12-11 DIAGNOSIS — N2581 Secondary hyperparathyroidism of renal origin: Secondary | ICD-10-CM | POA: Diagnosis present

## 2019-12-11 DIAGNOSIS — I120 Hypertensive chronic kidney disease with stage 5 chronic kidney disease or end stage renal disease: Secondary | ICD-10-CM | POA: Diagnosis present

## 2019-12-11 DIAGNOSIS — E8770 Fluid overload, unspecified: Secondary | ICD-10-CM | POA: Diagnosis present

## 2019-12-11 DIAGNOSIS — N189 Chronic kidney disease, unspecified: Secondary | ICD-10-CM | POA: Diagnosis present

## 2019-12-11 DIAGNOSIS — F329 Major depressive disorder, single episode, unspecified: Secondary | ICD-10-CM | POA: Diagnosis present

## 2019-12-11 DIAGNOSIS — D72829 Elevated white blood cell count, unspecified: Secondary | ICD-10-CM | POA: Diagnosis present

## 2019-12-11 DIAGNOSIS — D509 Iron deficiency anemia, unspecified: Secondary | ICD-10-CM | POA: Diagnosis present

## 2019-12-11 DIAGNOSIS — N186 End stage renal disease: Secondary | ICD-10-CM | POA: Diagnosis present

## 2019-12-11 DIAGNOSIS — K219 Gastro-esophageal reflux disease without esophagitis: Secondary | ICD-10-CM | POA: Diagnosis present

## 2019-12-11 DIAGNOSIS — F17210 Nicotine dependence, cigarettes, uncomplicated: Secondary | ICD-10-CM | POA: Diagnosis present

## 2019-12-11 DIAGNOSIS — R7989 Other specified abnormal findings of blood chemistry: Secondary | ICD-10-CM | POA: Diagnosis present

## 2019-12-11 DIAGNOSIS — D631 Anemia in chronic kidney disease: Secondary | ICD-10-CM | POA: Diagnosis present

## 2019-12-11 DIAGNOSIS — E213 Hyperparathyroidism, unspecified: Secondary | ICD-10-CM | POA: Diagnosis present

## 2019-12-11 DIAGNOSIS — Z20822 Contact with and (suspected) exposure to covid-19: Secondary | ICD-10-CM | POA: Diagnosis present

## 2019-12-11 DIAGNOSIS — J9621 Acute and chronic respiratory failure with hypoxia: Secondary | ICD-10-CM | POA: Diagnosis present

## 2019-12-11 DIAGNOSIS — E1165 Type 2 diabetes mellitus with hyperglycemia: Secondary | ICD-10-CM | POA: Diagnosis not present

## 2019-12-11 DIAGNOSIS — Z7982 Long term (current) use of aspirin: Secondary | ICD-10-CM

## 2019-12-11 DIAGNOSIS — E1100 Type 2 diabetes mellitus with hyperosmolarity without nonketotic hyperglycemic-hyperosmolar coma (NKHHC): Secondary | ICD-10-CM

## 2019-12-11 DIAGNOSIS — E876 Hypokalemia: Secondary | ICD-10-CM | POA: Diagnosis present

## 2019-12-11 DIAGNOSIS — E785 Hyperlipidemia, unspecified: Secondary | ICD-10-CM | POA: Diagnosis present

## 2019-12-11 DIAGNOSIS — J449 Chronic obstructive pulmonary disease, unspecified: Secondary | ICD-10-CM | POA: Diagnosis present

## 2019-12-11 LAB — BASIC METABOLIC PANEL
Anion gap: 12 (ref 5–15)
Anion gap: 12 (ref 5–15)
Anion gap: 15 (ref 5–15)
BUN: 44 mg/dL — ABNORMAL HIGH (ref 8–23)
BUN: 41 mg/dL — ABNORMAL HIGH (ref 8–23)
BUN: 47 mg/dL — ABNORMAL HIGH (ref 8–23)
CO2: 25 mmol/L (ref 22–32)
CO2: 22 mmol/L (ref 22–32)
CO2: 21 mmol/L — ABNORMAL LOW (ref 22–32)
Calcium: 7.3 mg/dL — ABNORMAL LOW (ref 8.9–10.3)
Calcium: 6.7 mg/dL — ABNORMAL LOW (ref 8.9–10.3)
Calcium: 7.7 mg/dL — ABNORMAL LOW (ref 8.9–10.3)
Chloride: 84 mmol/L — ABNORMAL LOW (ref 98–111)
Chloride: 95 mmol/L — ABNORMAL LOW (ref 98–111)
Chloride: 94 mmol/L — ABNORMAL LOW (ref 98–111)
Creatinine, Ser: 7.07 mg/dL — ABNORMAL HIGH (ref 0.44–1.00)
Creatinine, Ser: 6.27 mg/dL — ABNORMAL HIGH (ref 0.44–1.00)
Creatinine, Ser: 7.14 mg/dL — ABNORMAL HIGH (ref 0.44–1.00)
GFR calc Af Amer: 6 mL/min — ABNORMAL LOW (ref >60)
GFR calc Af Amer: 7 mL/min — ABNORMAL LOW (ref >60)
GFR calc Af Amer: 6 mL/min — ABNORMAL LOW (ref >60)
GFR calc non Af Amer: 5 mL/min — ABNORMAL LOW (ref >60)
GFR calc non Af Amer: 6 mL/min — ABNORMAL LOW (ref >60)
GFR calc non Af Amer: 5 mL/min — ABNORMAL LOW (ref >60)
Glucose, Bld: 1175 mg/dL (ref 70–99)
Glucose, Bld: 715 mg/dL (ref 70–99)
Glucose, Bld: 286 mg/dL — ABNORMAL HIGH (ref 70–99)
Potassium: 3.4 mmol/L — ABNORMAL LOW (ref 3.5–5.1)
Potassium: 2.6 mmol/L — CL (ref 3.5–5.1)
Potassium: 2.8 mmol/L — ABNORMAL LOW (ref 3.5–5.1)
Sodium: 121 mmol/L — ABNORMAL LOW (ref 135–145)
Sodium: 129 mmol/L — ABNORMAL LOW (ref 135–145)
Sodium: 130 mmol/L — ABNORMAL LOW (ref 135–145)

## 2019-12-11 LAB — GLUCOSE, CAPILLARY
Glucose-Capillary: >600 mg/dL (ref 70–99)
Glucose-Capillary: >600 mg/dL (ref 70–99)
Glucose-Capillary: >600 mg/dL (ref 70–99)
Glucose-Capillary: >600 mg/dL (ref 70–99)
Glucose-Capillary: >600 mg/dL (ref 70–99)
Glucose-Capillary: >600 mg/dL (ref 70–99)
Glucose-Capillary: >600 mg/dL (ref 70–99)
Glucose-Capillary: 547 mg/dL (ref 70–99)
Glucose-Capillary: >600 mg/dL (ref 70–99)
Glucose-Capillary: 428 mg/dL — ABNORMAL HIGH (ref 70–99)
Glucose-Capillary: 259 mg/dL — ABNORMAL HIGH (ref 70–99)
Glucose-Capillary: 166 mg/dL — ABNORMAL HIGH (ref 70–99)
Glucose-Capillary: 152 mg/dL — ABNORMAL HIGH (ref 70–99)

## 2019-12-11 LAB — CBC WITH DIFFERENTIAL/PLATELET
Abs Immature Granulocytes: 0.09 10*3/uL — ABNORMAL HIGH (ref 0.00–0.07)
Basophils Absolute: 0 10*3/uL (ref 0.0–0.1)
Basophils Relative: 0 %
Eosinophils Absolute: 0 10*3/uL (ref 0.0–0.5)
Eosinophils Relative: 0 %
HCT: 22.1 % — ABNORMAL LOW (ref 36.0–46.0)
Hemoglobin: 6.7 g/dL — ABNORMAL LOW (ref 12.0–15.0)
Immature Granulocytes: 1 %
Lymphocytes Relative: 3 %
Lymphs Abs: 0.3 10*3/uL — ABNORMAL LOW (ref 0.7–4.0)
MCH: 29.3 pg (ref 26.0–34.0)
MCHC: 30.3 g/dL (ref 30.0–36.0)
MCV: 96.5 fL (ref 80.0–100.0)
Monocytes Absolute: 0.5 10*3/uL (ref 0.1–1.0)
Monocytes Relative: 4 %
Neutro Abs: 10.5 10*3/uL — ABNORMAL HIGH (ref 1.7–7.7)
Neutrophils Relative %: 92 %
Platelets: 202 10*3/uL (ref 150–400)
RBC: 2.29 MIL/uL — ABNORMAL LOW (ref 3.87–5.11)
RDW: 16.4 % — ABNORMAL HIGH (ref 11.5–15.5)
WBC: 11.4 10*3/uL — ABNORMAL HIGH (ref 4.0–10.5)
nRBC: 0 % (ref 0.0–0.2)

## 2019-12-11 LAB — RESPIRATORY PANEL BY RT PCR (FLU A&B, COVID)
Influenza A by PCR: NEGATIVE
Influenza B by PCR: NEGATIVE
SARS Coronavirus 2 by RT PCR: NEGATIVE

## 2019-12-11 LAB — MAGNESIUM: Magnesium: 1.4 mg/dL — ABNORMAL LOW (ref 1.7–2.4)

## 2019-12-11 LAB — BRAIN NATRIURETIC PEPTIDE: B Natriuretic Peptide: 2569 pg/mL — ABNORMAL HIGH (ref 0.0–100.0)

## 2019-12-11 LAB — PHOSPHORUS: Phosphorus: 5.2 mg/dL — ABNORMAL HIGH (ref 2.5–4.6)

## 2019-12-11 MED ORDER — DEXTROSE 50 % IV SOLN: 0.0000 mL | INTRAVENOUS | Status: DC | PRN

## 2019-12-11 MED ORDER — RENA-VITE PO TABS
1.0000 | ORAL_TABLET | Freq: Every day | ORAL | Status: DC
  Administered 2019-12-12 – 2019-12-14 (×3): 1 via ORAL
  Filled 2019-12-11 (×3): qty 1

## 2019-12-11 MED ORDER — FUROSEMIDE 10 MG/ML IJ SOLN
40.0000 mg | Freq: Once | INTRAMUSCULAR | Status: AC
  Administered 2019-12-11: 13:00:00 40 mg via INTRAVENOUS
  Filled 2019-12-11: qty 4

## 2019-12-11 MED ORDER — POTASSIUM CHLORIDE CRYS ER 20 MEQ PO TBCR
40.0000 meq | EXTENDED_RELEASE_TABLET | Freq: Once | ORAL | Status: AC
  Administered 2019-12-11: 13:00:00 40 meq via ORAL
  Filled 2019-12-11: qty 2

## 2019-12-11 MED ORDER — MIRTAZAPINE 15 MG PO TABS
7.5000 mg | ORAL_TABLET | Freq: Every day | ORAL | Status: DC
  Administered 2019-12-12 – 2019-12-13 (×2): 7.5 mg via ORAL
  Filled 2019-12-11 (×2): qty 1

## 2019-12-11 MED ORDER — POTASSIUM CHLORIDE 10 MEQ/100ML IV SOLN
10.0000 meq | INTRAVENOUS | Status: DC
  Filled 2019-12-11 (×2): qty 100

## 2019-12-11 MED ORDER — FUROSEMIDE 40 MG PO TABS
80.0000 mg | ORAL_TABLET | Freq: Every day | ORAL | Status: DC
  Administered 2019-12-12 – 2019-12-14 (×3): 80 mg via ORAL
  Filled 2019-12-11 (×2): qty 4
  Filled 2019-12-11: qty 2

## 2019-12-11 MED ORDER — ALBUTEROL SULFATE HFA 108 (90 BASE) MCG/ACT IN AERS
2.0000 | INHALATION_SPRAY | RESPIRATORY_TRACT | Status: DC | PRN
  Filled 2019-12-11: qty 6.7

## 2019-12-11 MED ORDER — ATORVASTATIN CALCIUM 20 MG PO TABS
40.0000 mg | ORAL_TABLET | Freq: Every day | ORAL | Status: DC
  Administered 2019-12-12 – 2019-12-13 (×2): 40 mg via ORAL
  Filled 2019-12-11 (×2): qty 2

## 2019-12-11 MED ORDER — POTASSIUM CHLORIDE CRYS ER 20 MEQ PO TBCR
40.0000 meq | EXTENDED_RELEASE_TABLET | ORAL | Status: AC
  Administered 2019-12-11: 18:00:00 40 meq via ORAL
  Filled 2019-12-11: qty 2

## 2019-12-11 MED ORDER — ESCITALOPRAM OXALATE 10 MG PO TABS
10.0000 mg | ORAL_TABLET | Freq: Every day | ORAL | Status: DC
  Administered 2019-12-12 – 2019-12-14 (×3): 10 mg via ORAL
  Filled 2019-12-11 (×3): qty 1

## 2019-12-11 MED ORDER — METOPROLOL TARTRATE 50 MG PO TABS
100.0000 mg | ORAL_TABLET | Freq: Two times a day (BID) | ORAL | Status: DC
  Administered 2019-12-12 – 2019-12-14 (×4): 100 mg via ORAL
  Filled 2019-12-11 (×4): qty 2

## 2019-12-11 MED ORDER — NICOTINE 21 MG/24HR TD PT24
21.0000 mg | MEDICATED_PATCH | Freq: Every day | TRANSDERMAL | Status: DC
  Administered 2019-12-11 – 2019-12-14 (×3): 21 mg via TRANSDERMAL
  Filled 2019-12-11 (×4): qty 1

## 2019-12-11 MED ORDER — SODIUM CHLORIDE 0.9 % IV SOLN: INTRAVENOUS | Status: DC

## 2019-12-11 MED ORDER — CALCIUM ACETATE (PHOS BINDER) 667 MG PO CAPS
1334.0000 mg | ORAL_CAPSULE | Freq: Three times a day (TID) | ORAL | Status: DC
  Administered 2019-12-12 – 2019-12-14 (×8): 1334 mg via ORAL
  Filled 2019-12-11 (×9): qty 2

## 2019-12-11 MED ORDER — POTASSIUM CHLORIDE CRYS ER 20 MEQ PO TBCR
40.0000 meq | EXTENDED_RELEASE_TABLET | Freq: Once | ORAL | Status: DC
  Filled 2019-12-11: qty 2

## 2019-12-11 MED ORDER — DM-GUAIFENESIN ER 30-600 MG PO TB12
1.0000 | ORAL_TABLET | Freq: Two times a day (BID) | ORAL | Status: DC
  Administered 2019-12-11 – 2019-12-14 (×6): 1 via ORAL
  Filled 2019-12-11 (×6): qty 1

## 2019-12-11 MED ORDER — DEXTROSE-NACL 5-0.45 % IV SOLN: INTRAVENOUS | Status: DC

## 2019-12-11 MED ORDER — AMLODIPINE BESYLATE 5 MG PO TABS
2.5000 mg | ORAL_TABLET | Freq: Every day | ORAL | Status: DC
  Administered 2019-12-12 – 2019-12-14 (×3): 2.5 mg via ORAL
  Filled 2019-12-11 (×3): qty 1

## 2019-12-11 MED ORDER — ONDANSETRON HCL 4 MG/2ML IJ SOLN: 4.0000 mg | Freq: Three times a day (TID) | INTRAMUSCULAR | Status: DC | PRN

## 2019-12-11 MED ORDER — INSULIN REGULAR(HUMAN) IN NACL 100-0.9 UT/100ML-% IV SOLN
INTRAVENOUS | Status: DC
  Administered 2019-12-11: 13:00:00 8.5 [IU]/h via INTRAVENOUS
  Filled 2019-12-11: qty 100

## 2019-12-11 MED ORDER — PANTOPRAZOLE SODIUM 40 MG PO TBEC
40.0000 mg | DELAYED_RELEASE_TABLET | Freq: Every day | ORAL | Status: DC
  Administered 2019-12-12 – 2019-12-14 (×3): 40 mg via ORAL
  Filled 2019-12-11 (×3): qty 1

## 2019-12-11 MED ORDER — HYDRALAZINE HCL 50 MG PO TABS
100.0000 mg | ORAL_TABLET | Freq: Three times a day (TID) | ORAL | Status: DC
  Administered 2019-12-12 – 2019-12-14 (×7): 100 mg via ORAL
  Filled 2019-12-11 (×7): qty 2

## 2019-12-11 MED ORDER — ACETAMINOPHEN 650 MG RE SUPP: 650.0000 mg | Freq: Four times a day (QID) | RECTAL | Status: DC | PRN

## 2019-12-11 MED ORDER — HYDRALAZINE HCL 25 MG PO TABS: 25.0000 mg | ORAL_TABLET | Freq: Three times a day (TID) | ORAL | Status: DC | PRN

## 2019-12-11 NOTE — ED Notes (Signed)
Pt placed on 4L [place] for O2 of 80%

## 2019-12-11 NOTE — ED Notes (Signed)
Pt c/o burning at IV site in RAC, IV will no longer flush and the area appears swollen. IV team consult placed for 2nd line due to insulin gtt

## 2019-12-11 NOTE — Progress Notes (Signed)
Inpatient Diabetes Program Recommendations  AACE/ADA: New Consensus Statement on Inpatient Glycemic Control (2015)  Target Ranges:  Prepandial:   less than 140 mg/dL      Peak postprandial:   less than 180 mg/dL (1-2 hours)      Critically ill patients:  140 - 180 mg/dL   Lab Results  Component Value Date   GLUCAP >600 ([place]) 12/11/2019    Review of Glycemic Control Results for Anita Wells, Anita Wells (MRN [id number]) as of 12/11/2019 14:02  Ref. Range 12/11/2019 11:32  Glucose-Capillary Latest Ref Range: 70 - 99 mg/dL >600 ([facility])   Diabetes history: None Outpatient Diabetes medications: None Current orders for Inpatient glycemic control:  IV insulin  Inpatient Diabetes Program Recommendations:    Note patient with newly diagnosed DM.  Chart review indicates normal glucose values in October and November 2020.    Of note patient was started on Prednisone taper on 2/3 and per orders ended on 12/07/19- ? If this could have contributed to marked hyperglycemia as well.  Will follow.  Once patient is ready for transition off insulin drip, consider very conservative transition due to hx. Of ESRD. Consider Lantus 10 units (0.15 units/kg) 1-2 hours prior to d/c of insulin drip.   Will follow and speak to patient when appropriate.   Thanks  Adah Perl, RN, BC-ADM Inpatient Diabetes Coordinator Pager [phone] (8a-5p)

## 2019-12-11 NOTE — ED Notes (Signed)
Pt had small BM, brown in color. Brief and bed linens changed.

## 2019-12-11 NOTE — ED Triage Notes (Signed)
Pt arrives from home via [facility] for c/o weight gain of 120 to 146 lbs. Pt does peritoneal dialysis at home and reports her doctor put her on a medication to "help her gain weight" and the dialysis center wanted her checked out for the significant weight gain. Pt A&Ox4 and in NAD. No SHOB noted.

## 2019-12-11 NOTE — ED Notes (Signed)
Dr. Joan Mayans informed of critical glucose

## 2019-12-11 NOTE — ED Notes (Signed)
Pt O2 100% on 4L [place], decreased to 2L [place], O2 98%

## 2019-12-11 NOTE — ED Provider Notes (Signed)
[facility] Emergency Department Provider Note  ____________________________________________   First MD Initiated Contact with Patient 12/11/19 1000     (approximate)  I have reviewed the triage vital signs and the nursing notes.  History  Chief Complaint Weight Gain    HPI Anita Wells is a 79 y.o. female past medical history as below, notable for ESRD on PD nightly who presents to the emergency department for weight gain and concern for volume overload.  Over the last week or so patient has been noted to be gaining weight. Per family report her normal weight (in pounds) is in the low to mid 120s, now weighing in the 140s this morning.  Her dialysis center has been concern for volume overload, and therefore ordered an "emergency bag" for her peritoneal dialysis last night.  Today, when she remained with notable weight gain as well as concern for shortness of breath, dialysis/nephrology recommended evaluation in the emergency department with possible need for back up HD.  On chart review, it appears at her clinic visit on 2/3 weight was 59 kg, today she is 66.2 kg.  She does report some cough and shortness of breath, which she attributes to a cold that she has been fighting for the last week or so.  She was prescribed amoxicillin and prednisone to treat bronchitis, and had initially attributed her weight gain to the steroids. Denies any known COVID exposures.   She does report swelling of the ankles and shins, increased from normal.  She denies any chest pain.  On arrival to the emergency department she is noted to be hypoxic to 80% on RA, placed on 4 L nasal cannula.  Patient states she does not typically wear oxygen during the day, occasionally as needed at night.  She reports compliance with her medications, including her Lasix, which she reports good response to.   Past Medical Hx Past Medical History:  Diagnosis Date  . Anemia   . Barrett's esophagus   .  Carotid stenosis, bilateral   . Chronic kidney disease   . GERD (gastroesophageal reflux disease)   . Hyperlipidemia   . Hyperparathyroidism ([place])   . Hypertension   . Renal artery stenosis (HCC)    left    Problem List Patient Active Problem List   Diagnosis Date Noted  . Shortness of breath 07/27/2019  . ESRD on dialysis ([place]) 11/22/2018  . Essential hypertension 11/22/2018  . Hyperlipidemia 11/22/2018  . Anemia due to chronic kidney disease 11/12/2018    Past Surgical Hx Past Surgical History:  Procedure Laterality Date  . A/V FISTULAGRAM Left 02/19/2019   Procedure: A/V FISTULAGRAM;  Surgeon: Algernon Huxley, MD;  Location: [place] [facility];  Service: Cardiovascular;  Laterality: Left;  . AV FISTULA PLACEMENT  12/21/2018   Procedure: ARTERIOVENOUS (AV) FISTULA CREATION;  Surgeon: Algernon Huxley, MD;  Location: [facility];  Service: Vascular;;  . COLON RESECTION    . COLON SURGERY    . colonoscpy    . DIALYSIS/PERMA CATHETER INSERTION N/A 11/15/2018   Procedure: DIALYSIS/PERMA CATHETER INSERTION;  Surgeon: Algernon Huxley, MD;  Location: [place] [facility];  Service: Cardiovascular;  Laterality: N/A;  . DIALYSIS/PERMA CATHETER REMOVAL N/A 09/27/2019   Procedure: DIALYSIS/PERMA CATHETER REMOVAL;  Surgeon: Algernon Huxley, MD;  Location: [place] [facility];  Service: Cardiovascular;  Laterality: N/A;  . RENAL ARTERY STENT Left     Medications Prior to Admission medications   Medication Sig Start Date End Date  Taking? Authorizing Provider  aspirin EC 81 MG tablet Take 81 mg by mouth daily.    [provider]  atorvastatin (LIPITOR) 40 MG tablet Take 40 mg by mouth at bedtime.     [provider]  calcitRIOL (ROCALTROL) 0.25 MCG capsule Take 0.25 mcg by mouth every Monday, Wednesday, and Friday.     [provider]  docusate sodium (COLACE) 100 MG capsule Take 100 mg by mouth 2 (two) times daily.    [provider]  escitalopram (LEXAPRO) 10 MG  tablet Take 10 mg by mouth daily.    [provider]  ferrous sulfate 325 (65 FE) MG tablet Take 325 mg by mouth daily with breakfast.    [provider]  fluticasone (FLONASE) 50 MCG/ACT nasal spray Place 2 sprays into the nose daily.    [provider]  furosemide (LASIX) 40 MG tablet Take 40 mg by mouth daily. On non-dialysis days    [provider]  mirtazapine (REMERON) 7.5 MG tablet Take 7.5 mg by mouth at bedtime.    [provider]  multivitamin (RENA-VIT) TABS tablet Take 1 tablet by mouth every Monday, Wednesday, and Friday.     [provider]  omeprazole (PRILOSEC) 40 MG capsule Take 40 mg by mouth daily.    [provider]  sodium bicarbonate 325 MG tablet Take 325 mg by mouth 2 (two) times daily.    [provider]    Allergies Clopidogrel, Ace inhibitors, and Amlodipine  Family Hx History reviewed. No pertinent family history.  Social Hx Social History   Tobacco Use  . Smoking status: Current Every Day Smoker    Packs/day: 0.25    Years: 50.00    Pack years: 12.50    Types: Cigarettes  . Smokeless tobacco: Never Used  Substance Use Topics  . Alcohol use: Not Currently  . Drug use: Never     Review of Systems  Constitutional: Negative for fever, chills.  Positive for volume overload Eyes: Negative for visual changes. ENT: Negative for sore throat. Cardiovascular: Negative for chest pain. Respiratory: Positive for shortness of breath, hypoxia. Gastrointestinal: Negative for nausea, vomiting.  Genitourinary: Negative for dysuria. Musculoskeletal: Positive for leg swelling. Skin: Negative for rash. Neurological: Negative for headaches.   Physical Exam  Vital Signs: ED Triage Vitals  Enc Vitals Group     BP 12/11/19 1005 (!) 152/73     Pulse Rate 12/11/19 1005 72     Resp 12/11/19 1005 18     Temp 12/11/19 1005 98.6 F (37 C)     Temp Source 12/11/19 1005 Oral     SpO2 --       Weight 12/11/19 1001 146 lb (66.2 kg)     Height 12/11/19 1001 5\' 6"  (1.676 m)     Head Circumference --      Peak Flow --      Pain Score 12/11/19 1001 0     Pain Loc --      Pain Edu? --      Excl. in [place]? --     Constitutional: Alert and oriented.  Head: Normocephalic. Atraumatic. Eyes: Conjunctivae clear. Sclera anicteric. Nose: No congestion. No rhinorrhea. Mouth/Throat: Wearing mask.  Neck: No stridor.   Cardiovascular: Normal rate, regular rhythm. Old AVF site in LUE with palpable pulse. Respiratory: Normal respiratory effort.  80% on room air, placed on 4 LNC. Scattered coarse lung sounds on left. Gastrointestinal: Soft. Non-tender. Non-distended.  Musculoskeletal: Symmetric pitting edema to  the bilateral lower extremities to the mid shin. Neurologic:  Normal speech and language. No gross focal neurologic deficits are appreciated.  Skin: Skin is warm, dry and intact. No rash noted. Psychiatric: Mood and affect are appropriate for situation.  EKG  Personally reviewed.  Obtained 12/11/2019 10:37 AM.  Rate: 68 Rhythm: sinus Axis: normal Intervals: WNL Minimal ST depression inferior No STEMI    Radiology  CXR:  IMPRESSION:  1. Hyperexpanded lungs with coarsened interstitial markings  bilaterally suggesting COPD.  2. Probable trace bilateral pleural effusions.    Procedures  Procedure(s) performed (including critical care):  .Critical Care Performed by: Lilia Pro., MD Authorized by: Lilia Pro., MD   Critical care provider statement:    Critical care time (minutes):  91   Critical care was time spent personally by me on the following activities:  Discussions with consultants, evaluation of patient's response to treatment, examination of patient, ordering and performing treatments and interventions, ordering and review of laboratory studies, ordering and review of radiographic studies, pulse oximetry, re-evaluation of patient's condition, obtaining  history from patient or surrogate and review of old charts     Initial Impression / Assessment and Plan / ED Course  79 y.o. female hx of ESRD on PD who presents to the ED with concern for volume overload.   Exam is concerning for volume overload, with new oxygen requirement and pitting edema of the lower extremities.  Will obtain labs and imaging and discuss with nephrology.  Labs reveal stable anemia (known anemia of chronic disease) 6.7, from 6.9 previously. Able to decrease [place] to 2 L. XR with findings c/w COPD, which could also be contributing to her oxygen requirement.  Work up also reveal new onset diabetes and HHS, with glucose 1175 from 100s previously. No prior diagnosis of DM. Likely contributing to her failed PD and volume status. Sodium 121, corrects to normal considering hyperglycemia. BNP elevated as well.   Will give dose of Lasix for fluid status. Initiate insulin gtt for HHS and monitor glucose closely given ESRD status. Potassium 3.4 on BMP, will replete currently with 40 mEq orally given the plan for Lasix and insulin, though will need to also monitor closing in ESRD setting.   Discussed w/ hospitalist for admission. Nephrology aware.     Final Clinical Impression(s) / ED Diagnosis  Final diagnoses:  ESRD on peritoneal dialysis ([place])  Hyperosmolar hyperglycemic state (HHS) ([place])  Elevated brain natriuretic peptide (BNP) level       Note:  This document was prepared using Dragon voice recognition software and may include unintentional dictation errors.   Lilia Pro., MD 12/11/19 1220

## 2019-12-11 NOTE — ED Notes (Signed)
Dr. Blaine Hamper informed of critical glucose and K+, see new orders

## 2019-12-11 NOTE — ED Notes (Signed)
Report given to Alicia, RN

## 2019-12-11 NOTE — H&P (Signed)
History and Physical    Anita Wells [id number] DOB: 07-27-1941 DOA: 12/11/2019  Referring MD/NP/PA:   PCP: Dion Body, MD   Patient coming from:  The patient is coming from home.  At baseline, pt is independent for most of ADL.        Chief Complaint: Weight gain or shortness of breath  HPI: Anita Wells is a 79 y.o. 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  Patient states that in the past 2 to 3 weeks, he has gained nearly 20 pounds of body weight.  He has mild shortness of breath, mild dry cough.  No chest pain, fever or chills. She has nausea, but no vomiting.  He has 1 loose stool bowel movement and mild abdominal discomfort.  No symptoms of UTI or unilateral weakness.  He has  bilateral leg edema.   ED Course: pt was found to have WBC 11.4, hemoglobin 6.7 (6.9 on 08/29/2019), BNP 2569, negative Covid PCR, potassium 3.4, bicarbonate 25, pseudohyponatremia, creatinine 7.07, BUN 44, blood sugar 1175, anion gap 12, temperature normal, blood pressure 152/73, heart rate 72, RR 18, oxygen saturation 80% on room air, which improved to 98% on 2 L nasal cannula oxygen.  Chest x-ray showed COPD changes with trace amount of bilateral pleural effusion.  Patient is admitted to [facility] bed as inpatient.  Renal, Dr. Holley Raring was consulted for dialysis.  Review of Systems:   General: no fevers, chills, has body weight gain, has fatigue HEENT: no blurry vision, hearing changes or sore throat Respiratory: has dyspnea, coughing, no wheezing CV: no chest pain, no palpitations GI: has nausea, no vomiting, abdominal pain, has diarrhea, no constipation GU: no dysuria, burning on urination, increased urinary frequency, hematuria  Ext: has leg edema Neuro: no unilateral weakness,  numbness, or tingling, no vision change or hearing loss Skin: no rash, no skin tear. MSK: No muscle spasm, no deformity, no limitation of range of movement in spin Heme: No easy bruising.  Travel history: No recent long distant travel.  Allergy:  Allergies  Allergen Reactions  . Clopidogrel Swelling    Mouth swelling  . Ace Inhibitors Cough  . Amlodipine Other (See Comments)    Peripheral edema    Past Medical History:  Diagnosis Date  . Anemia   . Barrett's esophagus   . Carotid stenosis, bilateral   . Chronic kidney disease   . GERD (gastroesophageal reflux disease)   . Hyperlipidemia   . Hyperparathyroidism ([place])   . Hypertension   . Renal artery stenosis (HCC)    left    Past Surgical History:  Procedure Laterality Date  . A/V FISTULAGRAM Left 02/19/2019   Procedure: A/V FISTULAGRAM;  Surgeon: Algernon Huxley, MD;  Location: [facility];  Service: Cardiovascular;  Laterality: Left;  . AV FISTULA PLACEMENT  12/21/2018   Procedure: ARTERIOVENOUS (AV) FISTULA CREATION;  Surgeon: Algernon Huxley, MD;  Location: [facility];  Service: Vascular;;  . COLON RESECTION    . COLON SURGERY    . colonoscpy    . DIALYSIS/PERMA CATHETER INSERTION N/A 11/15/2018   Procedure: DIALYSIS/PERMA CATHETER INSERTION;  Surgeon: Algernon Huxley, MD;  Location: [facility];  Service: Cardiovascular;  Laterality: N/A;  . DIALYSIS/PERMA CATHETER REMOVAL N/A 09/27/2019   Procedure: DIALYSIS/PERMA CATHETER REMOVAL;  Surgeon: Algernon Huxley, MD;  Location:  [facility];  Service: Cardiovascular;  Laterality: N/A;  . RENAL ARTERY STENT Left     Social History:  reports that she has been smoking cigarettes. She has a 12.50 pack-year smoking history. She has never used smokeless tobacco. She reports previous alcohol use. She reports that she does not use drugs.  Family History: History reviewed. No pertinent family history.   Prior to Admission medications   Medication Sig Start Date End  Date Taking? Authorizing Provider  aspirin EC 81 MG tablet Take 81 mg by mouth daily.    [provider]  atorvastatin (LIPITOR) 40 MG tablet Take 40 mg by mouth at bedtime.     [provider]  calcitRIOL (ROCALTROL) 0.25 MCG capsule Take 0.25 mcg by mouth every Monday, Wednesday, and Friday.     [provider]  docusate sodium (COLACE) 100 MG capsule Take 100 mg by mouth 2 (two) times daily.    [provider]  escitalopram (LEXAPRO) 10 MG tablet Take 10 mg by mouth daily.    [provider]  ferrous sulfate 325 (65 FE) MG tablet Take 325 mg by mouth daily with breakfast.    [provider]  fluticasone (FLONASE) 50 MCG/ACT nasal spray Place 2 sprays into the nose daily.    [provider]  furosemide (LASIX) 40 MG tablet Take 40 mg by mouth daily. On non-dialysis days    [provider]  mirtazapine (REMERON) 7.5 MG tablet Take 7.5 mg by mouth at bedtime.    [provider]  multivitamin (RENA-VIT) TABS tablet Take 1 tablet by mouth every Monday, Wednesday, and Friday.     [provider]  omeprazole (PRILOSEC) 40 MG capsule Take 40 mg by mouth daily.    [provider]  sodium bicarbonate 325 MG tablet Take 325 mg by mouth 2 (two) times daily.    [provider]    Physical Exam: Vitals:   12/11/19 1200 12/11/19 1500 12/11/19 1530 12/11/19 1600  BP: (!) 148/64 (!) 150/63 (!) 149/57 (!) 146/56  Pulse: 71 69 67 66  Resp: (!) 24 (!) 23 (!) 22 (!) 23  Temp:      TempSrc:      SpO2: 98% 100% 100% 100%  Weight:      Height:       General: Not in acute distress HEENT:       Eyes: PERRL, EOMI, no scleral icterus.       ENT: No discharge from the ears and nose, no pharynx injection, no tonsillar enlargement.        Neck: No JVD, no bruit, no mass felt. Heme: No neck lymph node enlargement. Cardiac: S1/S2, RRR, No murmurs, No gallops or rubs. Respiratory:  No rales, wheezing,  rhonchi or rubs. GI: Soft, nondistended, nontender, no rebound pain, no organomegaly, BS present. GU: No hematuria Ext: 2+ pitting leg edema bilaterally. 2+DP/PT pulse bilaterally. Musculoskeletal: No joint deformities, No joint redness or warmth, no limitation of ROM in spin. Skin: No rashes.  Neuro: Alert, oriented X3, cranial nerves II-XII grossly intact, moves all extremities normally. Psych: Patient is not psychotic, no suicidal or hemocidal ideation.  Labs on Admission: I have personally reviewed following labs and imaging studies  CBC: Recent Labs  Lab 12/11/19 1052  WBC 11.4*  NEUTROABS 10.5*  HGB 6.7*  HCT 22.1*  MCV 96.5  PLT [id number]   Basic Metabolic Panel: Recent Labs  Lab 12/11/19 1052 12/11/19 1626  NA 121* 129*  K 3.4* 2.6*  CL 84* 95*  CO2 25 22  GLUCOSE 1,175* 715*  BUN 44* 41*  CREATININE 7.07* 6.27*  CALCIUM 7.3* 6.7*  MG 1.4*  --   PHOS 5.2*  --    GFR: Estimated Creatinine Clearance: 6.7 mL/min (A) (by C-G formula based on SCr of 6.27 mg/dL (H)). Liver Function Tests: No results for input(s): AST, ALT, ALKPHOS, BILITOT, PROT, ALBUMIN in the last 168 hours. No results for input(s): LIPASE, AMYLASE in the last 168 hours. No results for input(s): AMMONIA in the last 168 hours. Coagulation Profile: No results for input(s): INR, PROTIME in the last 168 hours. Cardiac Enzymes: No results for input(s): CKTOTAL, CKMB, CKMBINDEX, TROPONINI in the last 168 hours. BNP (last 3 results) No results for input(s): PROBNP in the last 8760 hours. HbA1C: No results for input(s): HGBA1C in the last 72 hours. CBG: Recent Labs  Lab 12/11/19 1424 12/11/19 1500 12/11/19 1530 12/11/19 1606 12/11/19 1639  GLUCAP >600* >600* >600* >600* >600*   Lipid Profile: No results for input(s): CHOL, HDL, LDLCALC, TRIG, CHOLHDL, LDLDIRECT in the last 72 hours. Thyroid Function Tests: No results for input(s): TSH, T4TOTAL, FREET4, T3FREE, THYROIDAB in the last 72  hours. Anemia Panel: No results for input(s): VITAMINB12, FOLATE, FERRITIN, TIBC, IRON, RETICCTPCT in the last 72 hours. Urine analysis: No results found for: COLORURINE, APPEARANCEUR, LABSPEC, PHURINE, GLUCOSEU, HGBUR, BILIRUBINUR, KETONESUR, PROTEINUR, UROBILINOGEN, NITRITE, LEUKOCYTESUR Sepsis Labs: @LABRCNTIP (procalcitonin:4,lacticidven:4) ) Recent Results (from the past 240 hour(s))  Respiratory Panel by RT PCR (Flu A&B, Covid) - Nasopharyngeal Swab     Status: None   Collection Time: 12/11/19 10:52 AM   Specimen: Nasopharyngeal Swab  Result Value Ref Range Status   SARS Coronavirus 2 by RT PCR NEGATIVE NEGATIVE Final    Comment: (NOTE) SARS-CoV-2 target nucleic acids are NOT DETECTED. The SARS-CoV-2 RNA is generally detectable in upper respiratoy specimens during the acute phase of infection. The lowest concentration of SARS-CoV-2 viral copies this assay can detect is 131 copies/mL. A negative result does not preclude SARS-Cov-2 infection and should not be used as the sole basis for treatment or other patient management decisions. A negative result may occur with  improper specimen collection/handling, submission of specimen other than nasopharyngeal swab, presence of viral mutation(s) within the areas targeted by this assay, and inadequate number of viral copies (<131 copies/mL). A negative result must be combined with clinical observations, patient history, and epidemiological information. The expected result is Negative. Fact Sheet for Patients:  PinkCheek.be Fact Sheet for Healthcare Providers:  GravelBags.it This test is not yet ap proved or cleared by the [place] FDA and  has been authorized for detection and/or diagnosis of SARS-CoV-2 by FDA under an Emergency Use Authorization (EUA). This EUA will remain  in effect (meaning this test can be used) for the duration of the COVID-19 declaration under Section  564(b)(1) of the Act, 21 U.S.C. section 360bbb-3(b)(1), unless the authorization is terminated or revoked sooner.    Influenza A by PCR NEGATIVE NEGATIVE Final   Influenza B by PCR NEGATIVE NEGATIVE Final    Comment: (NOTE) The Xpert Xpress SARS-CoV-2/FLU/RSV assay is intended as an aid in  the diagnosis of influenza from Nasopharyngeal swab specimens and  should not be used as a sole basis for treatment. Nasal washings and  aspirates are unacceptable for Xpert Xpress SARS-CoV-2/FLU/RSV  testing. Fact Sheet for Patients: PinkCheek.be Fact Sheet for Healthcare Providers: GravelBags.it This test is not yet approved or cleared by the [place]  FDA and  has been authorized for detection and/or diagnosis of SARS-CoV-2 by  FDA under an Emergency Use Authorization (EUA). This EUA will remain  in effect (meaning this test can be used) for the duration of the  Covid-19 declaration under Section 564(b)(1) of the Act, 21  U.S.C. section 360bbb-3(b)(1), unless the authorization is  terminated or revoked. Performed at [facility], [address] [place], [place] [address]      Radiological Exams on Admission: DG Chest 2 View  Result Date: 12/11/2019 CLINICAL DATA:  Shortness of breath, weight gain, peritoneal dialysis EXAM: CHEST - 2 VIEW COMPARISON:  07/29/2019 FINDINGS: Interval removal of right-sided hemodialysis catheter. The heart size and mediastinal contours are stable. Calcified thoracic aorta. Hyperexpanded lungs with flattening of the diaphragms and diffusely coarsened interstitial markings bilaterally. Probable trace bilateral pleural effusions. No focal airspace consolidation. No pneumothorax. The visualized skeletal structures are unremarkable. IMPRESSION: 1. Hyperexpanded lungs with coarsened interstitial markings bilaterally suggesting COPD. 2. Probable trace bilateral pleural effusions. Electronically Signed    By: Davina Poke D.O.   On: 12/11/2019 10:43     EKG: Independently reviewed.  Sinus rhythm, QTC 452, LAE, nonspecific T wave change  Assessment/Plan Principal Problem:   Type 2 diabetes mellitus with hyperosmolar hyperglycemic state (HHS) (HCC) Active Problems:   Anemia due to chronic kidney disease   ESRD on dialysis [facility])   Essential hypertension   Hyperlipidemia   Fluid overload   Hypokalemia   Leukocytosis   New onset type 2 diabetes mellitus with hyperosmolar hyperglycemic state (HHS) ([place]): Blood sugar is 1175. Pt recentlu took steroid which may have partially contributed -Admit to stepdown as inpatient - No NS bolus due to ESRD - IV insulin gtt with BMP q4h - IVF: NS 50 cc/h; will switch to D5-1/2NS when CBG<250 - replete K as needed - Zofran prn nausea  - NPO  - consult to diabetic educator  Anemia due to chronic kidney disease: hgb 6.9 on 08/29/19 --> 6.7 today -pt may need 1U of blood transfusion during dialysis  ESRD on peritoneal dialysis [facility]): -Renal, Dr. Holley Raring is consulted  Fluid overload: Patient has more than 20 pounds of weight gain recently.  Has oxygen desaturation.  No history of congestive heart failure.  2D echo on 12/06/2018 showed EF>55%. -volume management per renal by dialysis  HTN:  -Continue home medications: Amlodipine, hydralazine, metoprolol -hydralazine prn  Hyperlipidemia -Lipitor  Hypokalemia: K= 3.4  on admission. - Repleted  Leukocytosis:  WBC 11.4. No fever or signs of infection -f/u by CBC    Inpatient status:  # Patient requires inpatient status due to high intensity of service, high risk for further deterioration and high frequency of surveillance required.  I certify that at the point of admission it is my clinical judgment that the patient will require inpatient hospital care spanning beyond 2 midnights from the point of admission.  . 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 Now patient has presenting with Fluid overload and new onset type 2 diabetes mellitus with hyperosmolar hyperglycemic state . The worrisome physical exam findings include 2+ bilateral leg edema . The initial radiographic and laboratory data are worrisome because of significantly elevated blood sugar 1175, hypokalemia, low hemoglobin 6.7 . Current medical needs: please see my assessment and plan . Predictability of an adverse outcome (risk): Patient has multiple comorbidities as listed above. Now  presents with fluid overload and new onset type 2 diabetes mellitus with hyperosmolar hyperglycemic state. Patient's presentation is highly complicated.  Patient is at high risk of deteriorating.  Will need to be treated in hospital for at least 2 days.             DVT ppx: SCD Code Status: Full code Family Communication: No family at bed side.    Disposition Plan:  Anticipate discharge back to previous home environment Consults called:  Dr. Holley Raring of renal Admission status: SDU/inpation       Date of Service 12/11/2019    [facility]   If 7PM-7AM, please contact night-coverage [email or web address] 12/11/2019, 6:01 PM

## 2019-12-11 NOTE — ED Notes (Signed)
Pt otf for x ray

## 2019-12-12 ENCOUNTER — Other Ambulatory Visit: Payer: Self-pay

## 2019-12-12 ENCOUNTER — Encounter: Payer: Self-pay | Admitting: Internal Medicine

## 2019-12-12 DIAGNOSIS — I10 Essential (primary) hypertension: Secondary | ICD-10-CM

## 2019-12-12 DIAGNOSIS — N186 End stage renal disease: Secondary | ICD-10-CM

## 2019-12-12 DIAGNOSIS — E1100 Type 2 diabetes mellitus with hyperosmolarity without nonketotic hyperglycemic-hyperosmolar coma (NKHHC): Principal | ICD-10-CM

## 2019-12-12 DIAGNOSIS — E8770 Fluid overload, unspecified: Secondary | ICD-10-CM

## 2019-12-12 DIAGNOSIS — Z992 Dependence on renal dialysis: Secondary | ICD-10-CM

## 2019-12-12 DIAGNOSIS — E876 Hypokalemia: Secondary | ICD-10-CM

## 2019-12-12 DIAGNOSIS — E1165 Type 2 diabetes mellitus with hyperglycemia: Secondary | ICD-10-CM

## 2019-12-12 DIAGNOSIS — D631 Anemia in chronic kidney disease: Secondary | ICD-10-CM

## 2019-12-12 LAB — GLUCOSE, CAPILLARY
Glucose-Capillary: 106 mg/dL — ABNORMAL HIGH (ref 70–99)
Glucose-Capillary: 121 mg/dL — ABNORMAL HIGH (ref 70–99)
Glucose-Capillary: 141 mg/dL — ABNORMAL HIGH (ref 70–99)
Glucose-Capillary: 151 mg/dL — ABNORMAL HIGH (ref 70–99)
Glucose-Capillary: 152 mg/dL — ABNORMAL HIGH (ref 70–99)
Glucose-Capillary: 154 mg/dL — ABNORMAL HIGH (ref 70–99)
Glucose-Capillary: 155 mg/dL — ABNORMAL HIGH (ref 70–99)
Glucose-Capillary: 182 mg/dL — ABNORMAL HIGH (ref 70–99)
Glucose-Capillary: 203 mg/dL — ABNORMAL HIGH (ref 70–99)
Glucose-Capillary: 265 mg/dL — ABNORMAL HIGH (ref 70–99)

## 2019-12-12 LAB — CBC WITH DIFFERENTIAL/PLATELET
Abs Immature Granulocytes: 0.14 10*3/uL — ABNORMAL HIGH (ref 0.00–0.07)
Basophils Absolute: 0 10*3/uL (ref 0.0–0.1)
Basophils Relative: 0 %
Eosinophils Absolute: 0.1 10*3/uL (ref 0.0–0.5)
Eosinophils Relative: 1 %
HCT: 22.3 % — ABNORMAL LOW (ref 36.0–46.0)
Hemoglobin: 7.5 g/dL — ABNORMAL LOW (ref 12.0–15.0)
Immature Granulocytes: 1 %
Lymphocytes Relative: 4 %
Lymphs Abs: 0.6 10*3/uL — ABNORMAL LOW (ref 0.7–4.0)
MCH: 29.4 pg (ref 26.0–34.0)
MCHC: 33.6 g/dL (ref 30.0–36.0)
MCV: 87.5 fL (ref 80.0–100.0)
Monocytes Absolute: 0.5 10*3/uL (ref 0.1–1.0)
Monocytes Relative: 3 %
Neutro Abs: 13.6 10*3/uL — ABNORMAL HIGH (ref 1.7–7.7)
Neutrophils Relative %: 91 %
Platelets: 208 10*3/uL (ref 150–400)
RBC: 2.55 MIL/uL — ABNORMAL LOW (ref 3.87–5.11)
RDW: 15.6 % — ABNORMAL HIGH (ref 11.5–15.5)
WBC: 14.9 10*3/uL — ABNORMAL HIGH (ref 4.0–10.5)
nRBC: 0 % (ref 0.0–0.2)

## 2019-12-12 LAB — BASIC METABOLIC PANEL
Anion gap: 11 (ref 5–15)
Anion gap: 12 (ref 5–15)
BUN: 47 mg/dL — ABNORMAL HIGH (ref 8–23)
BUN: 50 mg/dL — ABNORMAL HIGH (ref 8–23)
CO2: 25 mmol/L (ref 22–32)
CO2: 23 mmol/L (ref 22–32)
Calcium: 7.8 mg/dL — ABNORMAL LOW (ref 8.9–10.3)
Calcium: 7.7 mg/dL — ABNORMAL LOW (ref 8.9–10.3)
Chloride: 94 mmol/L — ABNORMAL LOW (ref 98–111)
Chloride: 96 mmol/L — ABNORMAL LOW (ref 98–111)
Creatinine, Ser: 7.42 mg/dL — ABNORMAL HIGH (ref 0.44–1.00)
Creatinine, Ser: 7.56 mg/dL — ABNORMAL HIGH (ref 0.44–1.00)
GFR calc Af Amer: 5 mL/min — ABNORMAL LOW (ref >60)
GFR calc Af Amer: 5 mL/min — ABNORMAL LOW (ref >60)
GFR calc non Af Amer: 5 mL/min — ABNORMAL LOW (ref >60)
GFR calc non Af Amer: 5 mL/min — ABNORMAL LOW (ref >60)
Glucose, Bld: 171 mg/dL — ABNORMAL HIGH (ref 70–99)
Glucose, Bld: 125 mg/dL — ABNORMAL HIGH (ref 70–99)
Potassium: 3.3 mmol/L — ABNORMAL LOW (ref 3.5–5.1)
Potassium: 3.6 mmol/L (ref 3.5–5.1)
Sodium: 130 mmol/L — ABNORMAL LOW (ref 135–145)
Sodium: 131 mmol/L — ABNORMAL LOW (ref 135–145)

## 2019-12-12 LAB — MRSA PCR SCREENING: MRSA by PCR: NEGATIVE

## 2019-12-12 LAB — OSMOLALITY: Osmolality: 288 mOsm/kg (ref 275–295)

## 2019-12-12 LAB — PHOSPHORUS: Phosphorus: 3.1 mg/dL (ref 2.5–4.6)

## 2019-12-12 LAB — PREPARE RBC (CROSSMATCH)

## 2019-12-12 LAB — MAGNESIUM
Magnesium: 1.3 mg/dL — ABNORMAL LOW (ref 1.7–2.4)
Magnesium: 1.9 mg/dL (ref 1.7–2.4)

## 2019-12-12 MED ORDER — INSULIN GLARGINE 100 UNIT/ML ~~LOC~~ SOLN
10.0000 [IU] | Freq: Every day | SUBCUTANEOUS | Status: DC
  Administered 2019-12-12: 03:00:00 10 [IU] via SUBCUTANEOUS
  Filled 2019-12-12 (×2): qty 0.1

## 2019-12-12 MED ORDER — HEPARIN SODIUM (PORCINE) 1000 UNIT/ML DIALYSIS
1000.0000 [IU] | INTRAMUSCULAR | Status: DC | PRN
  Filled 2019-12-12: qty 1

## 2019-12-12 MED ORDER — SODIUM CHLORIDE 0.9 % IV SOLN: 100.0000 mL | INTRAVENOUS | Status: DC | PRN

## 2019-12-12 MED ORDER — INSULIN GLARGINE 100 UNIT/ML ~~LOC~~ SOLN
20.0000 [IU] | Freq: Every day | SUBCUTANEOUS | Status: DC
  Filled 2019-12-12: qty 0.2

## 2019-12-12 MED ORDER — DELFLEX-LC/1.5% DEXTROSE 344 MOSM/L IP SOLN
INTRAPERITONEAL | Status: DC
  Administered 2019-12-13: 21:00:00 9 L via INTRAPERITONEAL
  Filled 2019-12-12 (×2): qty 3000

## 2019-12-12 MED ORDER — CHLORHEXIDINE GLUCONATE CLOTH 2 % EX PADS
6.0000 | MEDICATED_PAD | Freq: Every day | CUTANEOUS | Status: DC
  Administered 2019-12-12: 08:00:00 6 via TOPICAL

## 2019-12-12 MED ORDER — LIDOCAINE-PRILOCAINE 2.5-2.5 % EX CREA
1.0000 "application " | TOPICAL_CREAM | CUTANEOUS | Status: DC | PRN
  Filled 2019-12-12: qty 5

## 2019-12-12 MED ORDER — CALCIUM ACETATE (PHOS BINDER) 667 MG PO CAPS
667.0000 mg | ORAL_CAPSULE | Freq: Two times a day (BID) | ORAL | Status: DC | PRN
  Filled 2019-12-12: qty 1

## 2019-12-12 MED ORDER — CHLORHEXIDINE GLUCONATE CLOTH 2 % EX PADS
6.0000 | MEDICATED_PAD | Freq: Every day | CUTANEOUS | Status: DC
  Administered 2019-12-12: 10:00:00 6 via TOPICAL

## 2019-12-12 MED ORDER — INSULIN ASPART 100 UNIT/ML ~~LOC~~ SOLN
2.0000 [IU] | Freq: Three times a day (TID) | SUBCUTANEOUS | Status: DC
  Administered 2019-12-12 – 2019-12-14 (×8): 2 [IU] via SUBCUTANEOUS
  Filled 2019-12-12 (×8): qty 1

## 2019-12-12 MED ORDER — POTASSIUM CHLORIDE 10 MEQ/100ML IV SOLN
10.0000 meq | INTRAVENOUS | Status: AC
  Administered 2019-12-12 (×4): 10 meq via INTRAVENOUS
  Filled 2019-12-12 (×6): qty 100

## 2019-12-12 MED ORDER — GENTAMICIN SULFATE 0.1 % EX CREA
1.0000 "application " | TOPICAL_CREAM | Freq: Every day | CUTANEOUS | Status: DC
  Administered 2019-12-13 – 2019-12-14 (×3): 1 via TOPICAL
  Filled 2019-12-12: qty 15

## 2019-12-12 MED ORDER — LIDOCAINE HCL (PF) 1 % IJ SOLN
5.0000 mL | INTRAMUSCULAR | Status: DC | PRN
  Filled 2019-12-12: qty 5

## 2019-12-12 MED ORDER — INSULIN ASPART 100 UNIT/ML ~~LOC~~ SOLN
0.0000 [IU] | SUBCUTANEOUS | Status: DC
  Administered 2019-12-12: 20:00:00 2 [IU] via SUBCUTANEOUS
  Administered 2019-12-12: 13:00:00 1 [IU] via SUBCUTANEOUS
  Administered 2019-12-12 – 2019-12-13 (×2): 3 [IU] via SUBCUTANEOUS
  Administered 2019-12-13 (×3): 1 [IU] via SUBCUTANEOUS
  Administered 2019-12-13: 12:00:00 4 [IU] via SUBCUTANEOUS
  Administered 2019-12-14: 12:00:00 1 [IU] via SUBCUTANEOUS
  Administered 2019-12-14: 06:00:00 2 [IU] via SUBCUTANEOUS
  Administered 2019-12-14: 3 [IU] via SUBCUTANEOUS
  Administered 2019-12-14: 09:00:00 1 [IU] via SUBCUTANEOUS
  Filled 2019-12-12 (×12): qty 1

## 2019-12-12 MED ORDER — INSULIN STARTER KIT- PEN NEEDLES (ENGLISH)
1.0000 | Freq: Once | Status: AC
  Administered 2019-12-12: 16:00:00 1
  Filled 2019-12-12: qty 1

## 2019-12-12 MED ORDER — MAGNESIUM SULFATE 2 GM/50ML IV SOLN
2.0000 g | Freq: Once | INTRAVENOUS | Status: AC
  Administered 2019-12-12: 04:00:00 2 g via INTRAVENOUS
  Filled 2019-12-12: qty 50

## 2019-12-12 MED ORDER — PENTAFLUOROPROP-TETRAFLUOROETH EX AERO
1.0000 "application " | INHALATION_SPRAY | CUTANEOUS | Status: DC | PRN
  Filled 2019-12-12: qty 30

## 2019-12-12 MED ORDER — INSULIN GLARGINE 100 UNIT/ML ~~LOC~~ SOLN: 20.0000 [IU] | Freq: Every day | SUBCUTANEOUS | Status: DC

## 2019-12-12 MED ORDER — ALTEPLASE 2 MG IJ SOLR: 2.0000 mg | Freq: Once | INTRAMUSCULAR | Status: DC | PRN

## 2019-12-12 MED ORDER — SODIUM CHLORIDE 0.9% IV SOLUTION: Freq: Once | INTRAVENOUS | Status: AC

## 2019-12-12 MED ORDER — LIVING WELL WITH DIABETES BOOK
Freq: Once | Status: AC
  Filled 2019-12-12: qty 1

## 2019-12-12 NOTE — Progress Notes (Signed)
Anita Wells Note    Anita Wells  [id number] DOB: Apr 30, 1941  DOA: 12/11/2019 PCP: Dion Body, MD      Brief Narrative:    Medical records reviewed and are as summarized below:       Assessment/Plan:   Principal Problem:   Type 2 diabetes mellitus with hyperosmolar hyperglycemic state (HHS) ([place]) Active Problems:   Anemia due to chronic kidney disease   ESRD on dialysis [facility])   Essential hypertension   Hyperlipidemia   Fluid overload   Hypokalemia   Leukocytosis   Type II DM with severe hyperglycemia/hyperosmolar hyperglycemic state: Glucose of 1,175 with admission.  She said she recently took a course of prednisone.  Glucose levels have improved.  She is off of IV insulin infusion.  She has been started on Lantus.  NovoLog as needed for hyperglycemia.  Acute on chronic anemia/anemia of chronic disease: Risks, benefits and alternatives to blood transfusion were discussed.  Patient is agreeable to blood transfusion.  Of note, patient was transfused with packed red blood cells in October 2020 for severe anemia.  ESRD on peritoneal dialysis: Follow-up with nephrologist for dialysis.  Acute on chronic hypoxemic respiratory failure: Patient said she only uses 2 L/min oxygen as needed or at night at home.  Continue oxygen via nasal cannula.  Taper off as able.  Hypertension: Continue antihypertensives  Hypokalemia and hypomagnesemia: Improved  Chronic hyponatremia: Sodium level is stable.   Body mass index is 23.57 kg/m.   Family Communication/Anticipated D/C date and plan/Code Status   DVT prophylaxis: SCDs Code Status: Full code Family Communication: Plan discussed with patient Disposition Plan: Patient is from home with plan for possible discharge to home in 1 to 2 days      Subjective:   No complaints.  No shortness of breath, chest pain or cough.  Objective:    Vitals:   12/12/19 1100 12/12/19 1200 12/12/19 1300 12/12/19 1400   BP: (!) 159/62 (!) 146/60 138/60 (!) 140/54  Pulse: 67 64 65 61  Resp: 19 20 14 18   Temp:  98.5 F (36.9 C)    TempSrc:  Axillary    SpO2: 100% 100% 98% 100%  Weight:      Height:        Intake/Output Summary (Last 24 hours) at 12/12/2019 1707 Last data filed at 12/12/2019 1200 Gross per 24 hour  Intake 801.67 ml  Output 0 ml  Net 801.67 ml   Filed Weights   12/11/19 1001  Weight: 66.2 kg    Exam:  GEN: NAD SKIN: No rash EYES: EOMI ENT: MMM CV: RRR PULM: Air entry adequate bilaterally, bilateral wheezing but no rales heard ABD: soft, ND, NT, +BS, PD catheter noted in the right side of abdomen. CNS: AAO x 3, non focal EXT: b/l leg edema, no tenderness or erythema   Data Reviewed:   I have personally reviewed following labs and imaging studies:  Labs: Labs show the following:   Basic Metabolic Panel: Recent Labs  Lab 12/11/19 1052 12/11/19 1052 12/11/19 1626 12/11/19 1626 12/11/19 2030 12/11/19 2030 12/12/19 0106 12/12/19 0107 12/12/19 0748 12/12/19 1505  NA 121*  --  129*  --  130*  --  130*  --  131*  --   K 3.4*   < > 2.6*   < > 2.8*   < > 3.3*  --  3.6  --   CL 84*  --  95*  --  94*  --  94*  --  96*  --   CO2 25  --  22  --  21*  --  25  --  23  --   GLUCOSE 1,175*  --  715*  --  286*  --  171*  --  125*  --   BUN 44*  --  41*  --  47*  --  47*  --  50*  --   CREATININE 7.07*  --  6.27*  --  7.14*  --  7.42*  --  7.56*  --   CALCIUM 7.3*  --  6.7*  --  7.7*  --  7.8*  --  7.7*  --   MG 1.4*  --   --   --   --   --   --  1.3* 1.9  --   PHOS 5.2*  --   --   --   --   --   --   --   --  3.1   < > = values in this interval not displayed.   GFR Estimated Creatinine Clearance: 5.6 mL/min (A) (by C-G formula based on SCr of 7.56 mg/dL (H)). Liver Function Tests: No results for input(s): AST, ALT, ALKPHOS, BILITOT, PROT, ALBUMIN in the last 168 hours. No results for input(s): LIPASE, AMYLASE in the last 168 hours. No results for input(s): AMMONIA in  the last 168 hours. Coagulation profile No results for input(s): INR, PROTIME in the last 168 hours.  CBC: Recent Labs  Lab 12/11/19 1052  WBC 11.4*  NEUTROABS 10.5*  HGB 6.7*  HCT 22.1*  MCV 96.5  PLT 202   Cardiac Enzymes: No results for input(s): CKTOTAL, CKMB, CKMBINDEX, TROPONINI in the last 168 hours. BNP (last 3 results) No results for input(s): PROBNP in the last 8760 hours. CBG: Recent Labs  Lab 12/12/19 0328 12/12/19 0427 12/12/19 0745 12/12/19 1129 12/12/19 1609  GLUCAP 152* 154* 121* 155* 106*   D-Dimer: No results for input(s): DDIMER in the last 72 hours. Hgb A1c: No results for input(s): HGBA1C in the last 72 hours. Lipid Profile: No results for input(s): CHOL, HDL, LDLCALC, TRIG, CHOLHDL, LDLDIRECT in the last 72 hours. Thyroid function studies: No results for input(s): TSH, T4TOTAL, T3FREE, THYROIDAB in the last 72 hours.  Invalid input(s): FREET3 Anemia work up: No results for input(s): VITAMINB12, FOLATE, FERRITIN, TIBC, IRON, RETICCTPCT in the last 72 hours. Sepsis Labs: Recent Labs  Lab 12/11/19 1052  WBC 11.4*    Microbiology Recent Results (from the past 240 hour(s))  Respiratory Panel by RT PCR (Flu A&B, Covid) - Nasopharyngeal Swab     Status: None   Collection Time: 12/11/19 10:52 AM   Specimen: Nasopharyngeal Swab  Result Value Ref Range Status   SARS Coronavirus 2 by RT PCR NEGATIVE NEGATIVE Final    Comment: (NOTE) SARS-CoV-2 target nucleic acids are NOT DETECTED. The SARS-CoV-2 RNA is generally detectable in upper respiratoy specimens during the acute phase of infection. The lowest concentration of SARS-CoV-2 viral copies this assay can detect is 131 copies/mL. A negative result does not preclude SARS-Cov-2 infection and should not be used as the sole basis for treatment or other patient management decisions. A negative result may occur with  improper specimen collection/handling, submission of specimen other than  nasopharyngeal swab, presence of viral mutation(s) within the areas targeted by this assay, and inadequate number of viral copies (<131 copies/mL). A negative result must be combined with clinical observations, patient history, and epidemiological information.  The expected result is Negative. Fact Sheet for Patients:  PinkCheek.be Fact Sheet for Healthcare Providers:  GravelBags.it This test is not yet ap proved or cleared by the [place] FDA and  has been authorized for detection and/or diagnosis of SARS-CoV-2 by FDA under an Emergency Use Authorization (EUA). This EUA will remain  in effect (meaning this test can be used) for the duration of the COVID-19 declaration under Section 564(b)(1) of the Act, 21 U.S.C. section 360bbb-3(b)(1), unless the authorization is terminated or revoked sooner.    Influenza A by PCR NEGATIVE NEGATIVE Final   Influenza B by PCR NEGATIVE NEGATIVE Final    Comment: (NOTE) The Xpert Xpress SARS-CoV-2/FLU/RSV assay is intended as an aid in  the diagnosis of influenza from Nasopharyngeal swab specimens and  should not be used as a sole basis for treatment. Nasal washings and  aspirates are unacceptable for Xpert Xpress SARS-CoV-2/FLU/RSV  testing. Fact Sheet for Patients: PinkCheek.be Fact Sheet for Healthcare Providers: GravelBags.it This test is not yet approved or cleared by the [place] FDA and  has been authorized for detection and/or diagnosis of SARS-CoV-2 by  FDA under an Emergency Use Authorization (EUA). This EUA will remain  in effect (meaning this test can be used) for the duration of the  Covid-19 declaration under Section 564(b)(1) of the Act, 21  U.S.C. section 360bbb-3(b)(1), unless the authorization is  terminated or revoked. Performed at [facility], [address] [place], [place] [address]   MRSA  PCR Screening     Status: None   Collection Time: 12/12/19  2:08 AM   Specimen: Nasopharyngeal  Result Value Ref Range Status   MRSA by PCR NEGATIVE NEGATIVE Final    Comment:        The GeneXpert MRSA Assay (FDA approved for NASAL specimens only), is one component of a comprehensive MRSA colonization surveillance program. It is not intended to diagnose MRSA infection nor to guide or monitor treatment for MRSA infections. Performed at [facility], [address] [place],  [address]     Procedures and diagnostic studies:  DG Chest 2 View  Result Date: 12/11/2019 CLINICAL DATA:  Shortness of breath, weight gain, peritoneal dialysis EXAM: CHEST - 2 VIEW COMPARISON:  07/29/2019 FINDINGS: Interval removal of right-sided hemodialysis catheter. The heart size and mediastinal contours are stable. Calcified thoracic aorta. Hyperexpanded lungs with flattening of the diaphragms and diffusely coarsened interstitial markings bilaterally. Probable trace bilateral pleural effusions. No focal airspace consolidation. No pneumothorax. The visualized skeletal structures are unremarkable. IMPRESSION: 1. Hyperexpanded lungs with coarsened interstitial markings bilaterally suggesting COPD. 2. Probable trace bilateral pleural effusions. Electronically Signed   By: Davina Poke D.O.   On: 12/11/2019 10:43    Medications:   . amLODipine  2.5 mg Oral Daily  . atorvastatin  40 mg Oral QHS  . calcium acetate  1,334 mg Oral TID WC  . Chlorhexidine Gluconate Cloth  6 each Topical Daily  . Chlorhexidine Gluconate Cloth  6 each Topical Q0600  . dextromethorphan-guaiFENesin  1 tablet Oral BID  . escitalopram  10 mg Oral Daily  . furosemide  80 mg Oral Daily  . gentamicin cream  1 application Topical Daily  . hydrALAZINE  100 mg Oral TID  . insulin aspart  0-6 Units Subcutaneous Q4H  . insulin aspart  2 Units Subcutaneous TID WC  . metoprolol tartrate  100 mg Oral BID  . mirtazapine   7.5 mg Oral QHS  . multivitamin  1  tablet Oral Daily  . nicotine  21 mg Transdermal Daily  . pantoprazole  40 mg Oral Daily   Continuous Infusions: . sodium chloride    . sodium chloride    . dialysis solution 1.5% low-MG/low-CA       LOS: 1 day   Kiondra Caicedo  [facility]     12/12/2019, 5:07 PM

## 2019-12-12 NOTE — Progress Notes (Signed)
Peritoneal dialysis initiated, no s/s of discomfort to note. Alert and oriented, orders implemented

## 2019-12-12 NOTE — Progress Notes (Signed)
Inpatient Diabetes Program Recommendations  AACE/ADA: New Consensus Statement on Inpatient Glycemic Control (2015)  Target Ranges:  Prepandial:   less than 140 mg/dL      Peak postprandial:   less than 180 mg/dL (1-2 hours)      Critically ill patients:  140 - 180 mg/dL   Lab Results  Component Value Date   GLUCAP 155 (H) 12/12/2019    Review of Glycemic Control Results for ROSENDA, GEFFRARD (MRN [id number]) as of 12/12/2019 13:31  Ref. Range 12/12/2019 04:27 12/12/2019 07:45 12/12/2019 11:29  Glucose-Capillary Latest Ref Range: 70 - 99 mg/dL 154 (H) 121 (H) 155 (H)  Diabetes history: None Outpatient Diabetes medications: None Current orders for Inpatient glycemic control:  Patient received Lantus 10 unit this AM upon transition off insulin drip Novolog 0-6 units tid with meals Novolog 2 units tid with meals Inpatient Diabetes Program Recommendations:    A1C still pending.  Blood sugars are much better today.  Based on admit blood sugars, patient will likely need insulin at d/c.  Will order Living Well with DM booklet and insulin starter kit. How much of hyperglycemia is from the steroid taper patient was previously on?  Thanks  Adah Perl, RN, BC-ADM Inpatient Diabetes Coordinator Pager [phone] (8a-5p)

## 2019-12-12 NOTE — Progress Notes (Signed)
[facility]  ROUNDING NOTE   Subjective:  Patient well-known to [place] as an outpatient. Normally on peritoneal dialysis. In the past she was also on hemodialysis. She had prior left upper extremity AV fistula however this does not appear to fully matured. Her prior target dry weight was 55.5kg. She is currently 66.2 kg. Patient also appears to have new onset diabetes mellitus type 2.  Objective:  Vital signs in last 24 hours:  Temp:  [98.1 F (36.7 C)-98.5 F (36.9 C)] 98.5 F (36.9 C) (02/17 1200) Pulse Rate:  [58-74] 61 (02/17 1400) Resp:  [14-38] 18 (02/17 1400) BP: (134-168)/(54-81) 140/54 (02/17 1400) SpO2:  [93 %-100 %] 100 % (02/17 1400)  Weight change:  Filed Weights   12/11/19 1001  Weight: 66.2 kg    Intake/Output: I/O last 3 completed shifts: In: 801.7 [I.V.:501.7; IV Piggyback:300] Out: 0    Intake/Output this shift:  No intake/output data recorded.  Physical Exam: General: No acute distress  Head: Normocephalic, atraumatic. Moist oral mucosal membranes  Eyes: Anicteric  Neck: Supple, trachea midline  Lungs:  Clear to auscultation, normal effort  Heart: S1S2 no rubs  Abdomen:  Soft, nontender, bowel sounds present  Extremities: Trace peripheral edema.  Neurologic: Awake, alert, following commands  Skin: No lesions  Access: PD catheter in place, LUE AVF that is pulsatile, very faint bruit    Basic Metabolic Panel: Recent Labs  Lab 12/11/19 1052 12/11/19 1052 12/11/19 1626 12/11/19 1626 12/11/19 2030 12/12/19 0106 12/12/19 0107 12/12/19 0748  NA 121*  --  129*  --  130* 130*  --  131*  K 3.4*  --  2.6*  --  2.8* 3.3*  --  3.6  CL 84*  --  95*  --  94* 94*  --  96*  CO2 25  --  22  --  21* 25  --  23  GLUCOSE 1,175*  --  715*  --  286* 171*  --  125*  BUN 44*  --  41*  --  47* 47*  --  50*  CREATININE 7.07*  --  6.27*  --  7.14* 7.42*  --  7.56*  CALCIUM 7.3*   < > 6.7*   < > 7.7* 7.8*  --  7.7*  MG 1.4*  --   --   --   --   --   1.3* 1.9  PHOS 5.2*  --   --   --   --   --   --   --    < > = values in this interval not displayed.    Liver Function Tests: No results for input(s): AST, ALT, ALKPHOS, BILITOT, PROT, ALBUMIN in the last 168 hours. No results for input(s): LIPASE, AMYLASE in the last 168 hours. No results for input(s): AMMONIA in the last 168 hours.  CBC: Recent Labs  Lab 12/11/19 1052  WBC 11.4*  NEUTROABS 10.5*  HGB 6.7*  HCT 22.1*  MCV 96.5  PLT 202    Cardiac Enzymes: No results for input(s): CKTOTAL, CKMB, CKMBINDEX, TROPONINI in the last 168 hours.  BNP: Invalid input(s): POCBNP  CBG: Recent Labs  Lab 12/12/19 0204 12/12/19 0328 12/12/19 0427 12/12/19 0745 12/12/19 1129  GLUCAP 182* 152* 154* 121* 155*    Microbiology: Results for orders placed or performed during the hospital encounter of 12/11/19  Respiratory Panel by RT PCR (Flu A&B, Covid) - Nasopharyngeal Swab     Status: None   Collection Time: 12/11/19 10:52  AM   Specimen: Nasopharyngeal Swab  Result Value Ref Range Status   SARS Coronavirus 2 by RT PCR NEGATIVE NEGATIVE Final    Comment: (NOTE) SARS-CoV-2 target nucleic acids are NOT DETECTED. The SARS-CoV-2 RNA is generally detectable in upper respiratoy specimens during the acute phase of infection. The lowest concentration of SARS-CoV-2 viral copies this assay can detect is 131 copies/mL. A negative result does not preclude SARS-Cov-2 infection and should not be used as the sole basis for treatment or other patient management decisions. A negative result may occur with  improper specimen collection/handling, submission of specimen other than nasopharyngeal swab, presence of viral mutation(s) within the areas targeted by this assay, and inadequate number of viral copies (<131 copies/mL). A negative result must be combined with clinical observations, patient history, and epidemiological information. The expected result is Negative. Fact Sheet for Patients:   PinkCheek.be Fact Sheet for Healthcare Providers:  GravelBags.it This test is not yet ap proved or cleared by the [place] FDA and  has been authorized for detection and/or diagnosis of SARS-CoV-2 by FDA under an Emergency Use Authorization (EUA). This EUA will remain  in effect (meaning this test can be used) for the duration of the COVID-19 declaration under Section 564(b)(1) of the Act, 21 U.S.C. section 360bbb-3(b)(1), unless the authorization is terminated or revoked sooner.    Influenza A by PCR NEGATIVE NEGATIVE Final   Influenza B by PCR NEGATIVE NEGATIVE Final    Comment: (NOTE) The Xpert Xpress SARS-CoV-2/FLU/RSV assay is intended as an aid in  the diagnosis of influenza from Nasopharyngeal swab specimens and  should not be used as a sole basis for treatment. Nasal washings and  aspirates are unacceptable for Xpert Xpress SARS-CoV-2/FLU/RSV  testing. Fact Sheet for Patients: PinkCheek.be Fact Sheet for Healthcare Providers: GravelBags.it This test is not yet approved or cleared by the [place] FDA and  has been authorized for detection and/or diagnosis of SARS-CoV-2 by  FDA under an Emergency Use Authorization (EUA). This EUA will remain  in effect (meaning this test can be used) for the duration of the  Covid-19 declaration under Section 564(b)(1) of the Act, 21  U.S.C. section 360bbb-3(b)(1), unless the authorization is  terminated or revoked. Performed at [facility], [address] [place], [place] [address]   MRSA PCR Screening     Status: None   Collection Time: 12/12/19  2:08 AM   Specimen: Nasopharyngeal  Result Value Ref Range Status   MRSA by PCR NEGATIVE NEGATIVE Final    Comment:        The GeneXpert MRSA Assay (FDA approved for NASAL specimens only), is one component of a comprehensive MRSA colonization surveillance  program. It is not intended to diagnose MRSA infection nor to guide or monitor treatment for MRSA infections. Performed at [facility], [address] [place], [place] [address]     Coagulation Studies: No results for input(s): LABPROT, INR in the last 72 hours.  Urinalysis: No results for input(s): COLORURINE, LABSPEC, PHURINE, GLUCOSEU, HGBUR, BILIRUBINUR, KETONESUR, PROTEINUR, UROBILINOGEN, NITRITE, LEUKOCYTESUR in the last 72 hours.  Invalid input(s): APPERANCEUR    Imaging: DG Chest 2 View  Result Date: 12/11/2019 CLINICAL DATA:  Shortness of breath, weight gain, peritoneal dialysis EXAM: CHEST - 2 VIEW COMPARISON:  07/29/2019 FINDINGS: Interval removal of right-sided hemodialysis catheter. The heart size and mediastinal contours are stable. Calcified thoracic aorta. Hyperexpanded lungs with flattening of the diaphragms and diffusely coarsened interstitial markings bilaterally. Probable trace bilateral pleural  effusions. No focal airspace consolidation. No pneumothorax. The visualized skeletal structures are unremarkable. IMPRESSION: 1. Hyperexpanded lungs with coarsened interstitial markings bilaterally suggesting COPD. 2. Probable trace bilateral pleural effusions. Electronically Signed   By: Davina Poke D.O.   On: 12/11/2019 10:43     Medications:   . sodium chloride    . sodium chloride     . amLODipine  2.5 mg Oral Daily  . atorvastatin  40 mg Oral QHS  . calcium acetate  1,334 mg Oral TID WC  . Chlorhexidine Gluconate Cloth  6 each Topical Daily  . Chlorhexidine Gluconate Cloth  6 each Topical Q0600  . dextromethorphan-guaiFENesin  1 tablet Oral BID  . escitalopram  10 mg Oral Daily  . furosemide  80 mg Oral Daily  . hydrALAZINE  100 mg Oral TID  . insulin aspart  0-6 Units Subcutaneous Q4H  . insulin aspart  2 Units Subcutaneous TID WC  . insulin starter kit- pen needles  1 kit Other Once  . living well with diabetes book   Does not apply Once   . metoprolol tartrate  100 mg Oral BID  . mirtazapine  7.5 mg Oral QHS  . multivitamin  1 tablet Oral Daily  . nicotine  21 mg Transdermal Daily  . pantoprazole  40 mg Oral Daily   sodium chloride, sodium chloride, acetaminophen, albuterol, alteplase, calcium acetate, dextrose, heparin, hydrALAZINE, lidocaine (PF), lidocaine-prilocaine, ondansetron (ZOFRAN) IV, pentafluoroprop-tetrafluoroeth  Assessment/ Plan:  79 y.o. female with past medical history of Barrett's esophagus, GERD, ESRD on PD, anemia chronic kidney disease, secondary hyperparathyroidism, hypertension, left renal artery stenosis, anemia of chronic kidney disease who presented with weight gain and shortness of breath and found to have new onset diabetes mellitus type 2.  1.  ESRD on PD.  Patient with development of new onset diabetes mellitus type 2.  Presenting blood glucose was 1175.  This may have prevented ultrafiltration as blood glucose may have indeed been higher than glucose within the PD solution.  Blood sugars under better control now.  We will plan to reinstitute PD now and see how she does with this.  2.  Diabetes mellitus type 2 without complication, new onset.  Blood sugar was 1175 upon admission.  This has come down significantly and currently blood glucose is 125.  Management as per hospitalist.  3.  Anemia of chronic kidney disease.  Hemoglobin down significantly 6.7.  Consider blood transfusion but defer to hospitalist.  4.  Secondary hyperparathyroidism.  Continue peritoneal monitor bone metabolism parameters during the hospitalization.   LOS: 1   2/17/20212:38 PM

## 2019-12-13 LAB — TYPE AND SCREEN
ABO/RH(D): B POS
Antibody Screen: NEGATIVE
Unit division: 0

## 2019-12-13 LAB — CBC WITH DIFFERENTIAL/PLATELET
Abs Immature Granulocytes: 0.12 10*3/uL — ABNORMAL HIGH (ref 0.00–0.07)
Basophils Absolute: 0 10*3/uL (ref 0.0–0.1)
Basophils Relative: 0 %
Eosinophils Absolute: 0.1 10*3/uL (ref 0.0–0.5)
Eosinophils Relative: 1 %
HCT: 24.2 % — ABNORMAL LOW (ref 36.0–46.0)
Hemoglobin: 8 g/dL — ABNORMAL LOW (ref 12.0–15.0)
Immature Granulocytes: 1 %
Lymphocytes Relative: 6 %
Lymphs Abs: 0.6 10*3/uL — ABNORMAL LOW (ref 0.7–4.0)
MCH: 28.7 pg (ref 26.0–34.0)
MCHC: 33.1 g/dL (ref 30.0–36.0)
MCV: 86.7 fL (ref 80.0–100.0)
Monocytes Absolute: 0.5 10*3/uL (ref 0.1–1.0)
Monocytes Relative: 5 %
Neutro Abs: 9.6 10*3/uL — ABNORMAL HIGH (ref 1.7–7.7)
Neutrophils Relative %: 87 %
Platelets: 174 10*3/uL (ref 150–400)
RBC: 2.79 MIL/uL — ABNORMAL LOW (ref 3.87–5.11)
RDW: 15.3 % (ref 11.5–15.5)
WBC: 11 10*3/uL — ABNORMAL HIGH (ref 4.0–10.5)
nRBC: 0 % (ref 0.0–0.2)

## 2019-12-13 LAB — GLUCOSE, CAPILLARY
Glucose-Capillary: 154 mg/dL — ABNORMAL HIGH (ref 70–99)
Glucose-Capillary: 191 mg/dL — ABNORMAL HIGH (ref 70–99)
Glucose-Capillary: 195 mg/dL — ABNORMAL HIGH (ref 70–99)
Glucose-Capillary: 244 mg/dL — ABNORMAL HIGH (ref 70–99)
Glucose-Capillary: 262 mg/dL — ABNORMAL HIGH (ref 70–99)
Glucose-Capillary: 266 mg/dL — ABNORMAL HIGH (ref 70–99)
Glucose-Capillary: 306 mg/dL — ABNORMAL HIGH (ref 70–99)

## 2019-12-13 LAB — BASIC METABOLIC PANEL
Anion gap: 10 (ref 5–15)
BUN: 43 mg/dL — ABNORMAL HIGH (ref 8–23)
CO2: 24 mmol/L (ref 22–32)
Calcium: 8 mg/dL — ABNORMAL LOW (ref 8.9–10.3)
Chloride: 95 mmol/L — ABNORMAL LOW (ref 98–111)
Creatinine, Ser: 7.17 mg/dL — ABNORMAL HIGH (ref 0.44–1.00)
GFR calc Af Amer: 6 mL/min — ABNORMAL LOW (ref >60)
GFR calc non Af Amer: 5 mL/min — ABNORMAL LOW (ref >60)
Glucose, Bld: 271 mg/dL — ABNORMAL HIGH (ref 70–99)
Potassium: 2.9 mmol/L — ABNORMAL LOW (ref 3.5–5.1)
Sodium: 129 mmol/L — ABNORMAL LOW (ref 135–145)

## 2019-12-13 LAB — BPAM RBC
Blood Product Expiration Date: 202103212359
ISSUE DATE / TIME: 202102172056
Unit Type and Rh: 7300

## 2019-12-13 LAB — PARATHYROID HORMONE, INTACT (NO CA): PTH: 181 pg/mL — ABNORMAL HIGH (ref 15–65)

## 2019-12-13 LAB — HEMOGLOBIN A1C
Hgb A1c MFr Bld: 13 % — ABNORMAL HIGH (ref 4.8–5.6)
Mean Plasma Glucose: 326 mg/dL

## 2019-12-13 LAB — MAGNESIUM: Magnesium: 1.6 mg/dL — ABNORMAL LOW (ref 1.7–2.4)

## 2019-12-13 MED ORDER — INSULIN GLARGINE 100 UNIT/ML ~~LOC~~ SOLN
10.0000 [IU] | Freq: Every day | SUBCUTANEOUS | Status: DC
  Administered 2019-12-13: 10:00:00 10 [IU] via SUBCUTANEOUS
  Filled 2019-12-13 (×2): qty 0.1

## 2019-12-13 MED ORDER — POTASSIUM CHLORIDE 10 MEQ/100ML IV SOLN
10.0000 meq | INTRAVENOUS | Status: AC
  Administered 2019-12-13 (×6): 10 meq via INTRAVENOUS
  Filled 2019-12-13 (×6): qty 100

## 2019-12-13 MED ORDER — MAGNESIUM SULFATE 2 GM/50ML IV SOLN
2.0000 g | Freq: Once | INTRAVENOUS | Status: AC
  Administered 2019-12-13: 05:00:00 2 g via INTRAVENOUS
  Filled 2019-12-13: qty 50

## 2019-12-13 MED ORDER — ALBUTEROL SULFATE (2.5 MG/3ML) 0.083% IN NEBU: 2.5000 mg | INHALATION_SOLUTION | RESPIRATORY_TRACT | Status: DC | PRN

## 2019-12-13 NOTE — Progress Notes (Signed)
Pd completed 

## 2019-12-13 NOTE — Progress Notes (Signed)
[facility] Note    Anita Wells  [id number] DOB: January 25, 1941  DOA: 12/11/2019 PCP: Dion Body, MD      Brief Narrative:    Medical records reviewed and are as summarized below:       Assessment/Plan:   Principal Problem:   Type 2 diabetes mellitus with hyperosmolar hyperglycemic state (HHS) ([place]) Active Problems:   Anemia due to chronic kidney disease   ESRD on dialysis [facility])   Essential hypertension   Hyperlipidemia   Fluid overload   Hypokalemia   Leukocytosis   Type II DM with severe hyperglycemia/hyperosmolar hyperglycemic state: Glucose of 1,175 with admission.  She said she recently took a course of prednisone.  Overall, glucose levels have improved but levels are fluctuating and it's still uncontrolled..  Continue Lantus.  NovoLog as needed for hyperglycemia.  Hemoglobin A1c was 13  Acute on chronic anemia/anemia of chronic disease: s/p transfusion with 1 unit of packed red blood cells on December 12, 2019.  H&H is stable.  Continue to monitor.  ESRD on peritoneal dialysis: Follow-up with nephrologist for peritoneal dialysis.  Acute on chronic hypoxemic respiratory failure: Patient said she only uses 2 L/min oxygen as needed or at night at home.  Continue oxygen via nasal cannula.  Taper off as able.  Hypertension: Continue antihypertensives  Hypokalemia is worse but hypomagnesemia is slightly better: Continue potassium and magnesium repletion intravenously.   Chronic hyponatremia: Sodium level is stable.   Body mass index is 22.31 kg/m.   Family Communication/Anticipated D/C date and plan/Code Status   DVT prophylaxis: SCDs Code Status: Full code Family Communication: Plan discussed with patient Disposition Plan: Patient is from home with plan for possible discharge to home in 1 to 2 days if glucose levels improve and stabilize and electrolytes improve as well.      Subjective:   Earlier this morning, patient had no complaints.   However later in the day, nurse reported patient had complained of shortness of breath.  Oxygen saturation 100% on 2 L.  Objective:    Vitals:   12/13/19 0500 12/13/19 0600 12/13/19 0800 12/13/19 1200  BP: (!) 115/48 (!) 114/48 (!) 157/66 (!) 162/95  Pulse: 63 (!) 59 69 66  Resp: 17 18 18  (!) 23  Temp:   98.2 F (36.8 C)   TempSrc:   Oral   SpO2: 100% 100% 99% 100%  Weight: 62.7 kg     Height:        Intake/Output Summary (Last 24 hours) at 12/13/2019 1452 Last data filed at 12/13/2019 1119 Gross per 24 hour  Intake 1089.01 ml  Output 675 ml  Net 414.01 ml   Filed Weights   12/11/19 1001 12/13/19 0500  Weight: 66.2 kg 62.7 kg    Exam:  GEN: NAD SKIN: No rash EYES: No pallor or icterus ENT: MMM CV: RRR PULM: Bilateral wheezing but no rales heard ABD: soft, ND, NT, +BS CNS: AAO x 3, non focal EXT: Bilateral leg edema without erythema or tenderness    Data Reviewed:   I have personally reviewed following labs and imaging studies:  Labs: Labs show the following:   Basic Metabolic Panel: Recent Labs  Lab 12/11/19 1052 12/11/19 1052 12/11/19 1626 12/11/19 1626 12/11/19 2030 12/11/19 2030 12/12/19 0106 12/12/19 0106 12/12/19 0107 12/12/19 0748 12/12/19 1505 12/13/19 0310  NA 121*   < > 129*  --  130*  --  130*  --   --  131*  --  129*  K 3.4*   < > 2.6*   < > 2.8*   < > 3.3*   < >  --  3.6  --  2.9*  CL 84*   < > 95*  --  94*  --  94*  --   --  96*  --  95*  CO2 25   < > 22  --  21*  --  25  --   --  23  --  24  GLUCOSE 1,175*   < > 715*  --  286*  --  171*  --   --  125*  --  271*  BUN 44*   < > 41*  --  47*  --  47*  --   --  50*  --  43*  CREATININE 7.07*   < > 6.27*  --  7.14*  --  7.42*  --   --  7.56*  --  7.17*  CALCIUM 7.3*   < > 6.7*  --  7.7*  --  7.8*  --   --  7.7*  --  8.0*  MG 1.4*  --   --   --   --   --   --   --  1.3* 1.9  --  1.6*  PHOS 5.2*  --   --   --   --   --   --   --   --   --  3.1  --    < > = values in this interval not  displayed.   GFR Estimated Creatinine Clearance: 5.9 mL/min (A) (by C-G formula based on SCr of 7.17 mg/dL (H)). Liver Function Tests: No results for input(s): AST, ALT, ALKPHOS, BILITOT, PROT, ALBUMIN in the last 168 hours. No results for input(s): LIPASE, AMYLASE in the last 168 hours. No results for input(s): AMMONIA in the last 168 hours. Coagulation profile No results for input(s): INR, PROTIME in the last 168 hours.  CBC: Recent Labs  Lab 12/11/19 1052 12/12/19 1725 12/13/19 0310  WBC 11.4* 14.9* 11.0*  NEUTROABS 10.5* 13.6* 9.6*  HGB 6.7* 7.5* 8.0*  HCT 22.1* 22.3* 24.2*  MCV 96.5 87.5 86.7  PLT 202 208 174   Cardiac Enzymes: No results for input(s): CKTOTAL, CKMB, CKMBINDEX, TROPONINI in the last 168 hours. BNP (last 3 results) No results for input(s): PROBNP in the last 8760 hours. CBG: Recent Labs  Lab 12/12/19 2347 12/13/19 0039 12/13/19 0336 12/13/19 0802 12/13/19 1128  GLUCAP 265* 244* 262* 195* 306*   D-Dimer: No results for input(s): DDIMER in the last 72 hours. Hgb A1c: Recent Labs    12/12/19 0107  HGBA1C 13.0*   Lipid Profile: No results for input(s): CHOL, HDL, LDLCALC, TRIG, CHOLHDL, LDLDIRECT in the last 72 hours. Thyroid function studies: No results for input(s): TSH, T4TOTAL, T3FREE, THYROIDAB in the last 72 hours.  Invalid input(s): FREET3 Anemia work up: No results for input(s): VITAMINB12, FOLATE, FERRITIN, TIBC, IRON, RETICCTPCT in the last 72 hours. Sepsis Labs: Recent Labs  Lab 12/11/19 1052 12/12/19 1725 12/13/19 0310  WBC 11.4* 14.9* 11.0*    Microbiology Recent Results (from the past 240 hour(s))  Respiratory Panel by RT PCR (Flu A&B, Covid) - Nasopharyngeal Swab     Status: None   Collection Time: 12/11/19 10:52 AM   Specimen: Nasopharyngeal Swab  Result Value Ref Range Status   SARS Coronavirus 2 by RT PCR NEGATIVE NEGATIVE Final    Comment: (NOTE) SARS-CoV-2 target nucleic  acids are NOT DETECTED. The SARS-CoV-2  RNA is generally detectable in upper respiratoy specimens during the acute phase of infection. The lowest concentration of SARS-CoV-2 viral copies this assay can detect is 131 copies/mL. A negative result does not preclude SARS-Cov-2 infection and should not be used as the sole basis for treatment or other patient management decisions. A negative result may occur with  improper specimen collection/handling, submission of specimen other than nasopharyngeal swab, presence of viral mutation(s) within the areas targeted by this assay, and inadequate number of viral copies (<131 copies/mL). A negative result must be combined with clinical observations, patient history, and epidemiological information. The expected result is Negative. Fact Sheet for Patients:  PinkCheek.be Fact Sheet for Healthcare Providers:  GravelBags.it This test is not yet ap proved or cleared by the [place] FDA and  has been authorized for detection and/or diagnosis of SARS-CoV-2 by FDA under an Emergency Use Authorization (EUA). This EUA will remain  in effect (meaning this test can be used) for the duration of the COVID-19 declaration under Section 564(b)(1) of the Act, 21 U.S.C. section 360bbb-3(b)(1), unless the authorization is terminated or revoked sooner.    Influenza A by PCR NEGATIVE NEGATIVE Final   Influenza B by PCR NEGATIVE NEGATIVE Final    Comment: (NOTE) The Xpert Xpress SARS-CoV-2/FLU/RSV assay is intended as an aid in  the diagnosis of influenza from Nasopharyngeal swab specimens and  should not be used as a sole basis for treatment. Nasal washings and  aspirates are unacceptable for Xpert Xpress SARS-CoV-2/FLU/RSV  testing. Fact Sheet for Patients: PinkCheek.be Fact Sheet for Healthcare Providers: GravelBags.it This test is not yet approved or cleared by the [place] FDA  and  has been authorized for detection and/or diagnosis of SARS-CoV-2 by  FDA under an Emergency Use Authorization (EUA). This EUA will remain  in effect (meaning this test can be used) for the duration of the  Covid-19 declaration under Section 564(b)(1) of the Act, 21  U.S.C. section 360bbb-3(b)(1), unless the authorization is  terminated or revoked. Performed at [facility], [address] [place], [place] [address]   MRSA PCR Screening     Status: None   Collection Time: 12/12/19  2:08 AM   Specimen: Nasopharyngeal  Result Value Ref Range Status   MRSA by PCR NEGATIVE NEGATIVE Final    Comment:        The GeneXpert MRSA Assay (FDA approved for NASAL specimens only), is one component of a comprehensive MRSA colonization surveillance program. It is not intended to diagnose MRSA infection nor to guide or monitor treatment for MRSA infections. Performed at [facility], [address] [place],  [address]     Procedures and diagnostic studies:  No results found.  Medications:   . amLODipine  2.5 mg Oral Daily  . atorvastatin  40 mg Oral QHS  . calcium acetate  1,334 mg Oral TID WC  . Chlorhexidine Gluconate Cloth  6 each Topical Daily  . Chlorhexidine Gluconate Cloth  6 each Topical Q0600  . dextromethorphan-guaiFENesin  1 tablet Oral BID  . escitalopram  10 mg Oral Daily  . furosemide  80 mg Oral Daily  . gentamicin cream  1 application Topical Daily  . hydrALAZINE  100 mg Oral TID  . insulin aspart  0-6 Units Subcutaneous Q4H  . insulin aspart  2 Units Subcutaneous TID WC  . insulin glargine  10 Units Subcutaneous Daily  . metoprolol tartrate  100 mg Oral BID  .  mirtazapine  7.5 mg Oral QHS  . multivitamin  1 tablet Oral Daily  . nicotine  21 mg Transdermal Daily  . pantoprazole  40 mg Oral Daily   Continuous Infusions: . sodium chloride    . sodium chloride    . dialysis solution 1.5% low-MG/low-CA       LOS: 2 days    Cobie Marcoux  [facility]     12/13/2019, 2:52 PM

## 2019-12-13 NOTE — TOC Initial Note (Signed)
Transition of Care [facility]) - Initial/Assessment Note    Patient Details  Name: Anita Wells MRN: [id number] Date of Birth: [birth year]  [facility] of [facility]) CM/SW Contact:    Magnus Ivan, LCSW Phone Number: 12/13/2019, 10:54 AM  Clinical Narrative:              CSW met with patient at bedside. Explained CSW role. Patient was alert and oriented. Patient stated she lives with her husband and daughter. PCP is Dr. Netty Starring, [place] will submit request for Nurse Secretary to schedule PCP appointment within 3-5 days of discharge. Patient reported she drives herself to appointments. Patient uses [facility] on [address] and declines issues with obtaining medicines. Patient has a walker at home. She reported she needs a 3 in 1 before discharge. Patient has never received home health services, but stated she would be open to them if they are a recommendation at discharge. Patient denied needs at this time. CSW encouraged patient to reach out if needs arise. CSW will continue to follow.      Expected Discharge Plan: Home/Self Care Barriers to Discharge: Continued Medical Work up   Patient Goals and CMS Choice        Expected Discharge Plan and Services Expected Discharge Plan: Home/Self Care       Living arrangements for the past 2 months: Single Family Home                                      Prior Living Arrangements/Services Living arrangements for the past 2 months: Single Family Home Lives with:: Spouse, Adult Children Patient language and need for interpreter reviewed:: Yes Do you feel safe going back to the place where you live?: Yes      Need for Family Participation in Patient Care: Yes (Comment) Care giver support system in place?: Yes (comment) Current home services: DME Criminal Activity/Legal Involvement Pertinent to Current Situation/Hospitalization: No - Comment as needed  Activities of Daily Living Home Assistive Devices/Equipment: Eyeglasses,  Environmental consultant (specify type), Dentures (specify type) ADL Screening (condition at time of admission) Patient's cognitive ability adequate to safely complete daily activities?: Yes Is the patient deaf or have difficulty hearing?: No Does the patient have difficulty seeing, even when wearing glasses/contacts?: No Does the patient have difficulty concentrating, remembering, or making decisions?: No Patient able to express need for assistance with ADLs?: Yes Does the patient have difficulty dressing or bathing?: No Independently performs ADLs?: Yes (appropriate for developmental age) Does the patient have difficulty walking or climbing stairs?: No Weakness of Legs: None Weakness of Arms/Hands: None  Permission Sought/Granted                  Emotional Assessment Appearance:: Appears stated age, Developmentally appropriate Attitude/Demeanor/Rapport: Engaged Affect (typically observed): Calm Orientation: : Oriented to Self, Oriented to Place, Oriented to  Time, Oriented to Situation Alcohol / Substance Use: Not Applicable Psych Involvement: No (comment)  Admission diagnosis:  Elevated brain natriuretic peptide (BNP) level [R79.89] ESRD on peritoneal dialysis ([place]) [N18.6, Z99.2] Hyperosmolar hyperglycemic state (HHS) ([facility]) [E11.00, E11.65] Type 2 diabetes mellitus with hyperosmolar hyperglycemic state (HHS) ([place]) [E11.00, E11.65] Patient Active Problem List   Diagnosis Date Noted  . Type 2 diabetes mellitus with hyperosmolar hyperglycemic state (HHS) ([place]) 12/11/2019  . Fluid overload 12/11/2019  . Hypokalemia 12/11/2019  . Leukocytosis 12/11/2019  . ESRD on peritoneal dialysis ([place])   .  Shortness of breath 07/27/2019  . ESRD on dialysis ([place]) 11/22/2018  . Essential hypertension 11/22/2018  . Hyperlipidemia 11/22/2018  . Anemia due to chronic kidney disease 11/12/2018   PCP:  Dion Body, MD [id number] [id number]: [phone]: [phone]    Social Determinants of Health ([place] Interventions    Readmission Risk Interventions Readmission Risk Prevention Plan 12/13/2019  Transportation Screening Complete  Medication Review ([place] Complete  PCP or Specialist appointment within 3-5 days of discharge Complete  [place] or [place] Applic[place]  Some recent data might be hidden

## 2019-12-13 NOTE — Progress Notes (Signed)
Pd started 

## 2019-12-13 NOTE — Progress Notes (Addendum)
Inpatient Diabetes Program Recommendations  AACE/ADA: New Consensus Statement on Inpatient Glycemic Control  Target Ranges:  Prepandial:   less than 140 mg/dL      Peak postprandial:   less than 180 mg/dL (1-2 hours)      Critically ill patients:  140 - 180 mg/dL   Results for Anita Wells, TUCH (MRN [id number]) as of 12/13/2019 08:26  Ref. Range 12/12/2019 07:45 12/12/2019 11:29 12/12/2019 16:09 12/12/2019 19:36 12/12/2019 23:47 12/13/2019 00:39 12/13/2019 03:36 12/13/2019 08:02  Glucose-Capillary Latest Ref Range: 70 - 99 mg/dL 121 (H) 155 (H) 106 (H) 203 (H) 265 (H) 244 (H) 262 (H) 195 (H)  Results for Anita Wells, Anita Wells (MRN [id number]) as of 12/13/2019 08:26  Ref. Range 12/12/2019 01:07  Hemoglobin A1C Latest Ref Range: 4.8 - 5.6 % 13.0 (H)  Results for Anita Wells, Anita Wells (MRN [id number]) as of 12/13/2019 08:26  Ref. Range 12/11/2019 10:52  Glucose Latest Ref Range: 70 - 99 mg/dL 1,175 (HH)   Review of Glycemic Control  Diabetes history: No Outpatient Diabetes medications: None Current orders for Inpatient glycemic control: Lantus 10 units daily, Novolog 0-6 units Q4H, Novolog 2 units TID with meals  Inpatient Diabetes Program Recommendations:   Oral DM medication: May want to consider ordering Tradjenta 5 mg daily (glucose dependent, low risk of hypoglycemia, and excreted via feces).  HgbA1C: A1C 13% on 12/12/19 indicating an average glucose 326 mg/dl.  NOTE: In reviewing chart, note patient seen [place], [place] on 11/28/19 for bronchitis and was prescribed Prednisone taper. Also noted telephone note on 12/07/19 in which patient called to request that a refill be sent for medications prescribed on 11/28/19 and all medications were refilled x1. Therefore, anticipate patient has been taking Prednisone since 11/28/19 which has contributed significantly to hyperglycemia. Will plan to talk with patient today.  Addendum 12/13/19'@15' :15-Spoke with patient over the phone about new diabetes diagnosis. Patient states that that has  never been told she had DM or preDM in the past. Patient sees her PCP every 3 months and has blood work done every 3 months. Patient thinks her blood work was last done in November 2020.  Patient confirms that she took Prednisone taper starting on 11/28/19 and when she finished it up, she called PCP to ask about getting some other medicines to help her feel better and the same medications prescribed on 2/3 were called in to her pharmacy and she continued to take Prednisone.  Discussed how steroids impact glycemic control and explained that steroids have also contributed to elevated A1C.  Patient states that her husband has DM and he uses insulin pens for DM control.  Discussed A1C results (13% on 12/12/19) and explained what an A1C is and informed patient that her current A1C indicates an average glucose of 326 mg/dl over the past 2-3 months. Discussed basic pathophysiology of DM Type 2, basic home care, importance of checking CBGs and maintaining good CBG control to prevent long-term and short-term complications. Reviewed glucose and A1C goals.  Reviewed signs and symptoms of hyperglycemia and hypoglycemia along with treatment for both. Discussed impact of nutrition, exercise, stress, sickness, and medications on diabetes control. Discussed Carb Modified diet, portion sizes, and informed patient that RD should be talking with her while inpatient to provide further education on Carb Modified diet (placed consult for RD). Patient states that she has a poor appetite and she has to make herself eat most times.  Patient is not sure if she has the Living Well with diabetes booklet yet (sent  chat to RN asking to be sure patient has it and to reorder if she doesn't). Asked patient to be sure to read through entire book when she receives it.  Informed patient she should also be getting an insulin starter kit so she can learn about insulin in case she is discharged on insulin. Patient states she has a fear of needles and she is  hesitant to self administer insulin. Informed patient that RNs will be working with her on insulin education and self administering insulin. Patient states that her husband can help her at home with insulin if prescribed. Patient states that she would prefer to use oral DM medication if possible. Patient does note that she is "not claiming diabetes and asking God to heal her from diabetes."  Provided support and encouragement and explained that we want her to educated about DM and what she needs to do to manage it at home but I respect her not claiming diabetes.  Patient verbalized understanding of information discussed and she states that she has no further questions at this time related to diabetes.   RNs to provide ongoing basic DM education at bedside with this patient and engage patient to actively check blood glucose and administer insulin injections.   Thanks, Barnie Alderman, RN, MSN, CDE Diabetes Coordinator Inpatient Diabetes Program [phone] (Team Pager from 8am to 5pm)

## 2019-12-13 NOTE — Progress Notes (Signed)
[facility]  ROUNDING NOTE   Subjective:  Patient feeling much better today. Did tolerate PD well overnight. UF achieved was 525 cc over the preceding PD session.  Objective:  Vital signs in last 24 hours:  Temp:  [97.8 F (36.6 C)-98.4 F (36.9 C)] 98.2 F (36.8 C) (02/18 0800) Pulse Rate:  [59-71] 66 (02/18 1200) Resp:  [14-23] 23 (02/18 1200) BP: (99-162)/(34-95) 162/95 (02/18 1200) SpO2:  [97 %-100 %] 100 % (02/18 1200) Weight:  [62.7 kg] 62.7 kg (02/18 0500)  Weight change: -3.525 kg Filed Weights   12/11/19 1001 12/13/19 0500  Weight: 66.2 kg 62.7 kg    Intake/Output: I/O last 3 completed shifts: In: 1250.7 [I.V.:501.7; Blood:340; IV [id number] Out: 0    Intake/Output this shift:  Total I/O In: 640 [P.O.:240; IV Piggyback:400] Out: [id number] [Urine:150; Other:525]  Physical Exam: General: No acute distress  Head: Normocephalic, atraumatic. Moist oral mucosal membranes  Eyes: Anicteric  Neck: Supple, trachea midline  Lungs:  Clear to auscultation, normal effort  Heart: S1S2 no rubs  Abdomen:  Soft, nontender, bowel sounds present  Extremities: Trace peripheral edema.  Neurologic: Awake, alert, following commands  Skin: No lesions  Access: PD catheter in place, LUE AVF that is pulsatile, very faint bruit    Basic Metabolic Panel: Recent Labs  Lab 12/11/19 1052 12/11/19 1052 12/11/19 1626 12/11/19 1626 12/11/19 2030 12/11/19 2030 12/12/19 0106 12/12/19 0107 12/12/19 0748 12/12/19 1505 12/13/19 0310  NA 121*   < > 129*  --  130*  --  130*  --  131*  --  129*  K 3.4*   < > 2.6*  --  2.8*  --  3.3*  --  3.6  --  2.9*  CL 84*   < > 95*  --  94*  --  94*  --  96*  --  95*  CO2 25   < > 22  --  21*  --  25  --  23  --  24  GLUCOSE 1,175*   < > 715*  --  286*  --  171*  --  125*  --  271*  BUN 44*   < > 41*  --  47*  --  47*  --  50*  --  43*  CREATININE 7.07*   < > 6.27*  --  7.14*  --  7.42*  --  7.56*  --  7.17*  CALCIUM 7.3*   < >  6.7*   < > 7.7*   < > 7.8*  --  7.7*  --  8.0*  MG 1.4*  --   --   --   --   --   --  1.3* 1.9  --  1.6*  PHOS 5.2*  --   --   --   --   --   --   --   --  3.1  --    < > = values in this interval not displayed.    Liver Function Tests: No results for input(s): AST, ALT, ALKPHOS, BILITOT, PROT, ALBUMIN in the last 168 hours. No results for input(s): LIPASE, AMYLASE in the last 168 hours. No results for input(s): AMMONIA in the last 168 hours.  CBC: Recent Labs  Lab 12/11/19 1052 12/12/19 1725 12/13/19 0310  WBC 11.4* 14.9* 11.0*  NEUTROABS 10.5* 13.6* 9.6*  HGB 6.7* 7.5* 8.0*  HCT 22.1* 22.3* 24.2*  MCV 96.5 87.5 86.7  PLT 202 208 174  Cardiac Enzymes: No results for input(s): CKTOTAL, CKMB, CKMBINDEX, TROPONINI in the last 168 hours.  BNP: Invalid input(s): POCBNP  CBG: Recent Labs  Lab 12/12/19 2347 12/13/19 0039 12/13/19 0336 12/13/19 0802 12/13/19 1128  GLUCAP 265* 244* 262* 195* 306*    Microbiology: Results for orders placed or performed during the hospital encounter of 12/11/19  Respiratory Panel by RT PCR (Flu A&B, Covid) - Nasopharyngeal Swab     Status: None   Collection Time: 12/11/19 10:52 AM   Specimen: Nasopharyngeal Swab  Result Value Ref Range Status   SARS Coronavirus 2 by RT PCR NEGATIVE NEGATIVE Final    Comment: (NOTE) SARS-CoV-2 target nucleic acids are NOT DETECTED. The SARS-CoV-2 RNA is generally detectable in upper respiratoy specimens during the acute phase of infection. The lowest concentration of SARS-CoV-2 viral copies this assay can detect is 131 copies/mL. A negative result does not preclude SARS-Cov-2 infection and should not be used as the sole basis for treatment or other patient management decisions. A negative result may occur with  improper specimen collection/handling, submission of specimen other than nasopharyngeal swab, presence of viral mutation(s) within the areas targeted by this assay, and inadequate number of  viral copies (<131 copies/mL). A negative result must be combined with clinical observations, patient history, and epidemiological information. The expected result is Negative. Fact Sheet for Patients:  PinkCheek.be Fact Sheet for Healthcare Providers:  GravelBags.it This test is not yet ap proved or cleared by the [place] FDA and  has been authorized for detection and/or diagnosis of SARS-CoV-2 by FDA under an Emergency Use Authorization (EUA). This EUA will remain  in effect (meaning this test can be used) for the duration of the COVID-19 declaration under Section 564(b)(1) of the Act, 21 U.S.C. section 360bbb-3(b)(1), unless the authorization is terminated or revoked sooner.    Influenza A by PCR NEGATIVE NEGATIVE Final   Influenza B by PCR NEGATIVE NEGATIVE Final    Comment: (NOTE) The Xpert Xpress SARS-CoV-2/FLU/RSV assay is intended as an aid in  the diagnosis of influenza from Nasopharyngeal swab specimens and  should not be used as a sole basis for treatment. Nasal washings and  aspirates are unacceptable for Xpert Xpress SARS-CoV-2/FLU/RSV  testing. Fact Sheet for Patients: PinkCheek.be Fact Sheet for Healthcare Providers: GravelBags.it This test is not yet approved or cleared by the [place] FDA and  has been authorized for detection and/or diagnosis of SARS-CoV-2 by  FDA under an Emergency Use Authorization (EUA). This EUA will remain  in effect (meaning this test can be used) for the duration of the  Covid-19 declaration under Section 564(b)(1) of the Act, 21  U.S.C. section 360bbb-3(b)(1), unless the authorization is  terminated or revoked. Performed at [facility], [address] [place], [place] [address]   MRSA PCR Screening     Status: None   Collection Time: 12/12/19  2:08 AM   Specimen: Nasopharyngeal  Result Value Ref  Range Status   MRSA by PCR NEGATIVE NEGATIVE Final    Comment:        The GeneXpert MRSA Assay (FDA approved for NASAL specimens only), is one component of a comprehensive MRSA colonization surveillance program. It is not intended to diagnose MRSA infection nor to guide or monitor treatment for MRSA infections. Performed at [facility], [address] [place], [place] [address]     Coagulation Studies: No results for input(s): LABPROT, INR in the last 72 hours.  Urinalysis: No results for input(s): COLORURINE, LABSPEC,  PHURINE, GLUCOSEU, HGBUR, BILIRUBINUR, KETONESUR, PROTEINUR, UROBILINOGEN, NITRITE, LEUKOCYTESUR in the last 72 hours.  Invalid input(s): APPERANCEUR    Imaging: No results found.   Medications:   . sodium chloride    . sodium chloride    . dialysis solution 1.5% low-MG/low-CA     . amLODipine  2.5 mg Oral Daily  . atorvastatin  40 mg Oral QHS  . calcium acetate  1,334 mg Oral TID WC  . Chlorhexidine Gluconate Cloth  6 each Topical Daily  . Chlorhexidine Gluconate Cloth  6 each Topical Q0600  . dextromethorphan-guaiFENesin  1 tablet Oral BID  . escitalopram  10 mg Oral Daily  . furosemide  80 mg Oral Daily  . gentamicin cream  1 application Topical Daily  . hydrALAZINE  100 mg Oral TID  . insulin aspart  0-6 Units Subcutaneous Q4H  . insulin aspart  2 Units Subcutaneous TID WC  . insulin glargine  10 Units Subcutaneous Daily  . metoprolol tartrate  100 mg Oral BID  . mirtazapine  7.5 mg Oral QHS  . multivitamin  1 tablet Oral Daily  . nicotine  21 mg Transdermal Daily  . pantoprazole  40 mg Oral Daily   sodium chloride, sodium chloride, acetaminophen, albuterol, alteplase, calcium acetate, dextrose, heparin, hydrALAZINE, lidocaine (PF), lidocaine-prilocaine, ondansetron (ZOFRAN) IV, pentafluoroprop-tetrafluoroeth  Assessment/ Plan:  79 y.o. female with past medical history of Barrett's esophagus, GERD, ESRD on PD, anemia chronic  kidney disease, secondary hyperparathyroidism, hypertension, left renal artery stenosis, anemia of chronic kidney disease who presented with weight gain and shortness of breath and found to have new onset diabetes mellitus type 2.  1.  ESRD on PD.  Patient with development of new onset diabetes mellitus type 2.  Presenting blood glucose was 1175.  This may have prevented ultrafiltration as blood glucose may have indeed been higher than glucose within the PD solution.   -Blood glucose has trended up with the use of peritoneal dialysis.  We will likely need to intensify the insulin regimen a bit.  Will discuss this with hospitalist.  Continue PD for now.  2.  Diabetes mellitus type 2 without complication, new onset.  Blood sugar was 1175 upon admission.  Adjust insulin regimen as per hospitalist.  3.  Anemia of chronic kidney disease.  Hemoglobin currently 8.0.  Maintain the patient on Epogen as an outpatient.  4.  Secondary hyperparathyroidism.  Serum phosphorus 3.1 at last check and acceptable.   LOS: 2 Cortland Crehan 2/18/20212:04 PM

## 2019-12-14 LAB — CBC WITH DIFFERENTIAL/PLATELET
Abs Immature Granulocytes: 0.13 10*3/uL — ABNORMAL HIGH (ref 0.00–0.07)
Basophils Absolute: 0 10*3/uL (ref 0.0–0.1)
Basophils Relative: 0 %
Eosinophils Absolute: 0.1 10*3/uL (ref 0.0–0.5)
Eosinophils Relative: 1 %
HCT: 28.7 % — ABNORMAL LOW (ref 36.0–46.0)
Hemoglobin: 9.8 g/dL — ABNORMAL LOW (ref 12.0–15.0)
Immature Granulocytes: 1 %
Lymphocytes Relative: 5 %
Lymphs Abs: 0.6 10*3/uL — ABNORMAL LOW (ref 0.7–4.0)
MCH: 29.1 pg (ref 26.0–34.0)
MCHC: 34.1 g/dL (ref 30.0–36.0)
MCV: 85.2 fL (ref 80.0–100.0)
Monocytes Absolute: 0.6 10*3/uL (ref 0.1–1.0)
Monocytes Relative: 5 %
Neutro Abs: 10.3 10*3/uL — ABNORMAL HIGH (ref 1.7–7.7)
Neutrophils Relative %: 88 %
Platelets: 218 10*3/uL (ref 150–400)
RBC: 3.37 MIL/uL — ABNORMAL LOW (ref 3.87–5.11)
RDW: 15.9 % — ABNORMAL HIGH (ref 11.5–15.5)
WBC: 11.7 10*3/uL — ABNORMAL HIGH (ref 4.0–10.5)
nRBC: 0 % (ref 0.0–0.2)

## 2019-12-14 LAB — BASIC METABOLIC PANEL
Anion gap: 9 (ref 5–15)
BUN: 40 mg/dL — ABNORMAL HIGH (ref 8–23)
CO2: 25 mmol/L (ref 22–32)
Calcium: 8.6 mg/dL — ABNORMAL LOW (ref 8.9–10.3)
Chloride: 96 mmol/L — ABNORMAL LOW (ref 98–111)
Creatinine, Ser: 6.89 mg/dL — ABNORMAL HIGH (ref 0.44–1.00)
GFR calc Af Amer: 6 mL/min — ABNORMAL LOW (ref >60)
GFR calc non Af Amer: 5 mL/min — ABNORMAL LOW (ref >60)
Glucose, Bld: 244 mg/dL — ABNORMAL HIGH (ref 70–99)
Potassium: 3.7 mmol/L (ref 3.5–5.1)
Sodium: 130 mmol/L — ABNORMAL LOW (ref 135–145)

## 2019-12-14 LAB — MAGNESIUM: Magnesium: 2 mg/dL (ref 1.7–2.4)

## 2019-12-14 LAB — GLUCOSE, CAPILLARY
Glucose-Capillary: 250 mg/dL — ABNORMAL HIGH (ref 70–99)
Glucose-Capillary: 159 mg/dL — ABNORMAL HIGH (ref 70–99)
Glucose-Capillary: 158 mg/dL — ABNORMAL HIGH (ref 70–99)

## 2019-12-14 MED ORDER — GLIPIZIDE 10 MG PO TABS: 10.0000 mg | ORAL_TABLET | Freq: Every day | ORAL | 0 refills | Status: DC

## 2019-12-14 MED ORDER — INSULIN GLARGINE 100 UNIT/ML ~~LOC~~ SOLN
15.0000 [IU] | Freq: Every day | SUBCUTANEOUS | Status: DC
  Administered 2019-12-14: 09:00:00 15 [IU] via SUBCUTANEOUS
  Filled 2019-12-14: qty 0.15

## 2019-12-14 MED ORDER — GLIPIZIDE 10 MG PO TABS: 10.0000 mg | ORAL_TABLET | Freq: Every day | ORAL | Status: DC

## 2019-12-14 MED ORDER — BLOOD GLUCOSE METER KIT: PACK | 0 refills | Status: DC

## 2019-12-14 NOTE — Progress Notes (Signed)
PD tx completed. Tolerated well with UF of 456ml and dwell time of 1:35.

## 2019-12-14 NOTE — Care Management Important Message (Signed)
Important Message  Patient Details  Name: Anita Wells MRN: [id number] Date of Birth: Oct 21, 1941   Medicare Important Message Given:  Yes     Dannette Barbara 12/14/2019, 10:57 AM

## 2019-12-14 NOTE — Progress Notes (Signed)
Peritoneal dialysis patient known at [facility]. Please contact me with any dialysis placement concerns.

## 2019-12-14 NOTE — Plan of Care (Signed)
  Problem: Clinical Measurements: Goal: Respiratory complications will improve Outcome: Progressing   Problem: Nutrition: Goal: Adequate nutrition will be maintained Outcome: Progressing   Problem: Coping: Goal: Level of anxiety will decrease Outcome: Progressing   

## 2019-12-14 NOTE — Progress Notes (Signed)
[name] to be D/C'd Home per MD order. Patient given discharge teaching and paperwork regarding medications, diet, follow-up appointments and activity. Patient understanding verbalized. No questions or complaints at this time. Skin condition as charted. IV and telemetry removed prior to leaving.  No further needs by Care Management/Social Work. Prescriptions e-prescribed to pharmacy.  An After Visit Summary was printed and given to the patient.   Patient escorted via wheelchair by NT.  [facility] delivered to room for patient to take home.  Anita Wells

## 2019-12-14 NOTE — Discharge Summary (Signed)
Physician Discharge Summary  JOLEY UTECHT [id number] DOB: 05-31-1941 DOA: 12/11/2019  PCP: Dion Body, MD  Admit date: 12/11/2019 Discharge date: 12/14/2019  Discharge disposition: Home   Recommendations for Outpatient Follow-Up:   Follow-up with PCP within 1 week of discharge  Discharge Diagnosis:   Principal Problem:   Type 2 diabetes mellitus with hyperosmolar hyperglycemic state (HHS) ([place]) Active Problems:   Anemia due to chronic kidney disease   ESRD on dialysis [facility])   Essential hypertension   Hyperlipidemia   Fluid overload   Hypokalemia   Leukocytosis    Discharge Condition: Stable.  Diet recommendation: Renal and low sugar diet  Code status: Full code.    Hospital Course:   Anita Wells is a 79 y.o. female with medical history significant of hypertension, hyperlipidemia, GERD, depression, iron deficiency anemia ESRD-on peritoneal dialysis, chronic hypoxemic respiratory failure with intermittent use of oxygen, tobacco abuse, left renal artery stenosis, carotid artery stenosis, who presented with weight gain, cough, bilateral leg swelling and shortness breath.  She was found to have severe hyperglycemia with blood glucose of 1175.  However, she was not in DKA.  She was admitted to the hospital for new onset diabetes with hyperosmolar hyperglycemic state.  She was treated with IV insulin infusion and IV fluids.  Hemoglobin A1c was 13.  It is worth noting that patient said she had received a course of prednisone about a week or 2 prior to admission.  Her glucose levels improved and she was switched to Lantus.  She was seen by diabetes coordinator and dietitian for counseling.  Patient was educated on management of diabetes, hyperglycemia and hypoglycemia and the symptoms to look out for.  She was also educated on insulin injections.  She has chronic anemia but her hemoglobin was 6.7 on admission.  She was transfused with 1 unit of packed red blood cells and  hemoglobin remained stable posttransfusion.  She was evaluated by the nephrologist for her ESRD and she underwent daily peritoneal dialysis.  She was given potassium and magnesium supplement for hypokalemia and hypomagnesemia respectively.  Her condition has improved and she is deemed stable for discharge to home.     Discharge Exam:   Vitals:   12/14/19 0449 12/14/19 0812  BP: (!) 150/74 (!) 159/58  Pulse: 70 69  Resp: 18 18  Temp: 98.5 F (36.9 C) 98 F (36.7 C)  SpO2: 98% 93%   Vitals:   12/13/19 1824 12/13/19 1947 12/14/19 0449 12/14/19 0812  BP: (!) 145/68 (!) 171/63 (!) 150/74 (!) 159/58  Pulse: 71 63 70 69  Resp: _0 Temp: 98.2 F (36.8 C) 98.2 F (36.8 C) 98.5 F (36.9 C) 98 F (36.7 C)  TempSrc:  Oral Oral Oral  SpO2: 95% 95% 98% 93%  Weight:   62.4 kg   Height:         GEN: NAD SKIN: No rash EYES: EOMI ENT: MMM CV: RRR PULM: CTA B ABD: soft, ND, NT, +BS CNS: AAO x 3, non focal EXT: B/l leg edema but no tenderness or erythema   The results of significant diagnostics from this hospitalization (including imaging, microbiology, ancillary and laboratory) are listed below for reference.     Procedures and Diagnostic Studies:   DG Chest 2 View  Result Date: 12/11/2019 CLINICAL DATA:  Shortness of breath, weight gain, peritoneal dialysis EXAM: CHEST - 2 VIEW COMPARISON:  07/29/2019 FINDINGS: Interval removal of right-sided hemodialysis catheter. The heart size and mediastinal contours  are stable. Calcified thoracic aorta. Hyperexpanded lungs with flattening of the diaphragms and diffusely coarsened interstitial markings bilaterally. Probable trace bilateral pleural effusions. No focal airspace consolidation. No pneumothorax. The visualized skeletal structures are unremarkable. IMPRESSION: 1. Hyperexpanded lungs with coarsened interstitial markings bilaterally suggesting COPD. 2. Probable trace bilateral pleural effusions. Electronically Signed   By:  Davina Poke D.O.   On: 12/11/2019 10:43     Labs:   Basic Metabolic Panel: Recent Labs  Lab 12/11/19 1052 12/11/19 1626 12/11/19 2030 12/11/19 2030 12/12/19 0106 12/12/19 0106 12/12/19 0107 12/12/19 0748 12/12/19 0748 12/12/19 1505 12/13/19 0310 12/14/19 0513  NA 121*   < > 130*  --  130*  --   --  131*  --   --  129* 130*  K 3.4*   < > 2.8*   < > 3.3*   < >  --  3.6   < >  --  2.9* 3.7  CL 84*   < > 94*  --  94*  --   --  96*  --   --  95* 96*  CO2 25   < > 21*  --  25  --   --  23  --   --  24 25  GLUCOSE 1,175*   < > 286*  --  171*  --   --  125*  --   --  271* 244*  BUN 44*   < > 47*  --  47*  --   --  50*  --   --  43* 40*  CREATININE 7.07*   < > 7.14*  --  7.42*  --   --  7.56*  --   --  7.17* 6.89*  CALCIUM 7.3*   < > 7.7*  --  7.8*  --   --  7.7*  --   --  8.0* 8.6*  MG 1.4*  --   --   --   --   --  1.3* 1.9  --   --  1.6* 2.0  PHOS 5.2*  --   --   --   --   --   --   --   --  3.1  --   --    < > = values in this interval not displayed.   GFR Estimated Creatinine Clearance: 6.1 mL/min (A) (by C-G formula based on SCr of 6.89 mg/dL (H)). Liver Function Tests: No results for input(s): AST, ALT, ALKPHOS, BILITOT, PROT, ALBUMIN in the last 168 hours. No results for input(s): LIPASE, AMYLASE in the last 168 hours. No results for input(s): AMMONIA in the last 168 hours. Coagulation profile No results for input(s): INR, PROTIME in the last 168 hours.  CBC: Recent Labs  Lab 12/11/19 1052 12/12/19 1725 12/13/19 0310 12/14/19 0513  WBC 11.4* 14.9* 11.0* 11.7*  NEUTROABS 10.5* 13.6* 9.6* 10.3*  HGB 6.7* 7.5* 8.0* 9.8*  HCT 22.1* 22.3* 24.2* 28.7*  MCV 96.5 87.5 86.7 85.2  PLT 202 208 174 218   Cardiac Enzymes: No results for input(s): CKTOTAL, CKMB, CKMBINDEX, TROPONINI in the last 168 hours. BNP: Invalid input(s): POCBNP CBG: Recent Labs  Lab 12/13/19 1600 12/13/19 1946 12/13/19 2345 12/14/19 0447 12/14/19 0813  GLUCAP 191* 154* 266* 250* 159*    D-Dimer No results for input(s): DDIMER in the last 72 hours. Hgb A1c Recent Labs    12/12/19 0107  HGBA1C 13.0*   Lipid Profile No results for input(s): CHOL, HDL, LDLCALC, TRIG, CHOLHDL,  LDLDIRECT in the last 72 hours. Thyroid function studies No results for input(s): TSH, T4TOTAL, T3FREE, THYROIDAB in the last 72 hours.  Invalid input(s): FREET3 Anemia work up No results for input(s): VITAMINB12, FOLATE, FERRITIN, TIBC, IRON, RETICCTPCT in the last 72 hours. Microbiology Recent Results (from the past 240 hour(s))  Respiratory Panel by RT PCR (Flu A&B, Covid) - Nasopharyngeal Swab     Status: None   Collection Time: 12/11/19 10:52 AM   Specimen: Nasopharyngeal Swab  Result Value Ref Range Status   SARS Coronavirus 2 by RT PCR NEGATIVE NEGATIVE Final    Comment: (NOTE) SARS-CoV-2 target nucleic acids are NOT DETECTED. The SARS-CoV-2 RNA is generally detectable in upper respiratoy specimens during the acute phase of infection. The lowest concentration of SARS-CoV-2 viral copies this assay can detect is 131 copies/mL. A negative result does not preclude SARS-Cov-2 infection and should not be used as the sole basis for treatment or other patient management decisions. A negative result may occur with  improper specimen collection/handling, submission of specimen other than nasopharyngeal swab, presence of viral mutation(s) within the areas targeted by this assay, and inadequate number of viral copies (<131 copies/mL). A negative result must be combined with clinical observations, patient history, and epidemiological information. The expected result is Negative. Fact Sheet for Patients:  PinkCheek.be Fact Sheet for Healthcare Providers:  GravelBags.it This test is not yet ap proved or cleared by the [place] FDA and  has been authorized for detection and/or diagnosis of SARS-CoV-2 by FDA under an Emergency Use  Authorization (EUA). This EUA will remain  in effect (meaning this test can be used) for the duration of the COVID-19 declaration under Section 564(b)(1) of the Act, 21 U.S.C. section 360bbb-3(b)(1), unless the authorization is terminated or revoked sooner.    Influenza A by PCR NEGATIVE NEGATIVE Final   Influenza B by PCR NEGATIVE NEGATIVE Final    Comment: (NOTE) The Xpert Xpress SARS-CoV-2/FLU/RSV assay is intended as an aid in  the diagnosis of influenza from Nasopharyngeal swab specimens and  should not be used as a sole basis for treatment. Nasal washings and  aspirates are unacceptable for Xpert Xpress SARS-CoV-2/FLU/RSV  testing. Fact Sheet for Patients: PinkCheek.be Fact Sheet for Healthcare Providers: GravelBags.it This test is not yet approved or cleared by the [place] FDA and  has been authorized for detection and/or diagnosis of SARS-CoV-2 by  FDA under an Emergency Use Authorization (EUA). This EUA will remain  in effect (meaning this test can be used) for the duration of the  Covid-19 declaration under Section 564(b)(1) of the Act, 21  U.S.C. section 360bbb-3(b)(1), unless the authorization is  terminated or revoked. Performed at [facility], [address] [place], [place] [address]   MRSA PCR Screening     Status: None   Collection Time: 12/12/19  2:08 AM   Specimen: Nasopharyngeal  Result Value Ref Range Status   MRSA by PCR NEGATIVE NEGATIVE Final    Comment:        The GeneXpert MRSA Assay (FDA approved for NASAL specimens only), is one component of a comprehensive MRSA colonization surveillance program. It is not intended to diagnose MRSA infection nor to guide or monitor treatment for MRSA infections. Performed at [facility], [address], [place], [place] [address]      Discharge Instructions:   Discharge Instructions    Diet Carb Modified   Complete by:  As directed    Diet renal 60/70-11-26-1198   Complete  by: As directed    Increase activity slowly   Complete by: As directed      Allergies as of 12/14/2019      Reactions   Clopidogrel Swelling   Mouth swelling   Ace Inhibitors Cough   Amlodipine Other (See Comments)   Peripheral edema      Medication List    STOP taking these medications   amoxicillin 500 MG capsule Commonly known as: AMOXIL   predniSONE 10 MG tablet Commonly known as: DELTASONE     TAKE these medications   amLODipine 2.5 MG tablet Commonly known as: NORVASC Take 2.5 mg by mouth daily.   atorvastatin 40 MG tablet Commonly known as: LIPITOR Take 40 mg by mouth at bedtime.   blood glucose meter kit and supplies Dispense based on patient and insurance preference. Use up to four times daily as directed. (FOR ICD-10 E10.9, E11.9).   calcium acetate 667 MG capsule Commonly known as: PHOSLO Take by mouth See admin instructions. Take 2 capsules by mouth with each meal and then 1 capsule with 2 snacks a day   escitalopram 10 MG tablet Commonly known as: LEXAPRO Take 10 mg by mouth daily.   furosemide 40 MG tablet Commonly known as: LASIX Take 80 mg by mouth daily. On non-dialysis days   glipiZIDE 10 MG tablet Commonly known as: GLUCOTROL Take 1 tablet (10 mg total) by mouth daily before breakfast. Start taking on: December 15, 2019   hydrALAZINE 100 MG tablet Commonly known as: APRESOLINE Take 100 mg by mouth 3 (three) times daily.   metoprolol tartrate 100 MG tablet Commonly known as: LOPRESSOR Take 100 mg by mouth 2 (two) times daily.   mirtazapine 7.5 MG tablet Commonly known as: REMERON Take 7.5 mg by mouth at bedtime.   multivitamin Tabs tablet Take 1 tablet by mouth daily.   omeprazole 40 MG capsule Commonly known as: PRILOSEC Take 40 mg by mouth daily.      Follow-up Information    Dion Body, MD. Schedule an appointment as soon as possible for a visit.   Specialty:  Family Medicine Why: Please schedule hospital follow up appointment for 3-5 days from discharge.  Contact information: [place] [address] [phone]            Time coordinating discharge: 31 minutes  Signed:  Jennye Boroughs  [facility] 12/14/2019, 9:56 AM

## 2020-09-15 ENCOUNTER — Encounter: Payer: Medicare Other | Attending: Pulmonary Disease

## 2020-09-15 ENCOUNTER — Other Ambulatory Visit: Payer: Self-pay

## 2020-09-15 DIAGNOSIS — J449 Chronic obstructive pulmonary disease, unspecified: Secondary | ICD-10-CM

## 2020-09-15 NOTE — Progress Notes (Signed)
Virtual Visit completed. Patient informed on EP and RD appointment and 6 Minute walk test. Patient also informed of patient health questionnaires on My Chart. Patient Verbalizes understanding. Visit diagnosis can be found in [facility] 09/09/2020. Patient is going to bring her medication list on her walk test day.

## 2020-09-23 ENCOUNTER — Other Ambulatory Visit: Payer: Self-pay

## 2020-09-23 VITALS — Ht 67.5 in | Wt 131.5 lb

## 2020-09-23 DIAGNOSIS — J449 Chronic obstructive pulmonary disease, unspecified: Secondary | ICD-10-CM

## 2020-09-23 NOTE — Progress Notes (Addendum)
Leeanna presented to [facility] today to complete her 6MWT and Gym orientation. Patient completed dialysis last night and reported that she did not feel very well today, therefore the 6MWT was not completed. Patient did complete other necessary paperwork to participate in Pulmonary Rehab and will complete her 6MWT when schedule permits. EP will reach out to provider to verify if there are specific times patient should exercise considering her dialysis times are overnight. Patient agreed with the above.   Naleyah is a current tobacco user. Intervention for tobacco cessation was provided at the initial medical review.  She was asked about readiness to quit and reported she is interested in tobacco cessation . Patient was advised and educated about tobacco cessation using combination therapy, tobacco cessation classes, quit line, and quit smoking apps. Patient was also provided with a fake cigarette to help with the oral fixation. Patient demonstrated understanding of this material. Staff will continue to provide encouragement and follow up with the patient throughout the program.

## 2020-10-03 ENCOUNTER — Ambulatory Visit: Payer: Medicare Other

## 2020-10-06 ENCOUNTER — Encounter: Payer: Self-pay | Admitting: *Deleted

## 2020-10-06 ENCOUNTER — Ambulatory Visit: Payer: Medicare Other

## 2020-10-06 DIAGNOSIS — J449 Chronic obstructive pulmonary disease, unspecified: Secondary | ICD-10-CM

## 2020-10-08 ENCOUNTER — Ambulatory Visit: Payer: Medicare Other

## 2020-10-10 ENCOUNTER — Ambulatory Visit: Payer: Medicare Other

## 2020-10-13 ENCOUNTER — Ambulatory Visit: Payer: Medicare Other

## 2020-10-15 ENCOUNTER — Ambulatory Visit: Payer: Medicare Other

## 2020-10-20 ENCOUNTER — Ambulatory Visit: Payer: Medicare Other

## 2020-10-22 ENCOUNTER — Ambulatory Visit: Payer: Medicare Other

## 2020-10-27 ENCOUNTER — Ambulatory Visit: Payer: Medicare Other

## 2020-10-29 ENCOUNTER — Ambulatory Visit: Payer: Medicare Other

## 2020-10-31 ENCOUNTER — Ambulatory Visit: Payer: Medicare Other

## 2020-11-02 ENCOUNTER — Other Ambulatory Visit: Payer: Self-pay

## 2020-11-02 ENCOUNTER — Other Ambulatory Visit: Payer: Medicare Other

## 2020-11-02 DIAGNOSIS — Z20822 Contact with and (suspected) exposure to covid-19: Secondary | ICD-10-CM

## 2020-11-03 ENCOUNTER — Ambulatory Visit: Payer: Medicare Other

## 2020-11-04 LAB — SARS-COV-2, NAA 2 DAY TAT

## 2020-11-04 LAB — NOVEL CORONAVIRUS, NAA: SARS-CoV-2, NAA: NOT DETECTED

## 2020-11-05 ENCOUNTER — Ambulatory Visit: Payer: Medicare Other

## 2020-11-07 ENCOUNTER — Ambulatory Visit: Payer: Medicare Other

## 2020-11-10 ENCOUNTER — Ambulatory Visit: Payer: Medicare Other

## 2020-11-12 ENCOUNTER — Ambulatory Visit: Payer: Medicare Other

## 2020-11-14 ENCOUNTER — Ambulatory Visit: Payer: Medicare Other

## 2020-11-17 ENCOUNTER — Ambulatory Visit: Payer: Medicare Other

## 2020-11-19 ENCOUNTER — Ambulatory Visit: Payer: Medicare Other

## 2020-11-21 ENCOUNTER — Ambulatory Visit: Payer: Medicare Other

## 2020-11-24 ENCOUNTER — Ambulatory Visit: Payer: Medicare Other

## 2020-11-26 ENCOUNTER — Ambulatory Visit: Payer: Medicare Other

## 2020-11-28 ENCOUNTER — Ambulatory Visit: Payer: Medicare Other

## 2020-12-01 ENCOUNTER — Ambulatory Visit: Payer: Medicare Other

## 2020-12-03 ENCOUNTER — Ambulatory Visit: Payer: Medicare Other

## 2020-12-05 ENCOUNTER — Ambulatory Visit: Payer: Medicare Other

## 2020-12-05 ENCOUNTER — Inpatient Hospital Stay
Admission: EM | Admit: 2020-12-05 | Discharge: 2020-12-08 | DRG: 871 | Disposition: A | Discharge status: Home / Self Care | Payer: Medicare Other | Attending: Internal Medicine | Admitting: Internal Medicine

## 2020-12-05 ENCOUNTER — Encounter: Payer: Self-pay | Admitting: Internal Medicine

## 2020-12-05 ENCOUNTER — Emergency Department: Payer: Medicare Other

## 2020-12-05 ENCOUNTER — Other Ambulatory Visit: Payer: Self-pay

## 2020-12-05 DIAGNOSIS — A09 Infectious gastroenteritis and colitis, unspecified: Secondary | ICD-10-CM | POA: Diagnosis present

## 2020-12-05 DIAGNOSIS — Z7982 Long term (current) use of aspirin: Secondary | ICD-10-CM

## 2020-12-05 DIAGNOSIS — D631 Anemia in chronic kidney disease: Secondary | ICD-10-CM | POA: Diagnosis present

## 2020-12-05 DIAGNOSIS — Z79899 Other long term (current) drug therapy: Secondary | ICD-10-CM

## 2020-12-05 DIAGNOSIS — N2581 Secondary hyperparathyroidism of renal origin: Secondary | ICD-10-CM | POA: Diagnosis present

## 2020-12-05 DIAGNOSIS — F17210 Nicotine dependence, cigarettes, uncomplicated: Secondary | ICD-10-CM | POA: Diagnosis present

## 2020-12-05 DIAGNOSIS — E1122 Type 2 diabetes mellitus with diabetic chronic kidney disease: Secondary | ICD-10-CM | POA: Diagnosis present

## 2020-12-05 DIAGNOSIS — N186 End stage renal disease: Secondary | ICD-10-CM

## 2020-12-05 DIAGNOSIS — Z833 Family history of diabetes mellitus: Secondary | ICD-10-CM

## 2020-12-05 DIAGNOSIS — I10 Essential (primary) hypertension: Secondary | ICD-10-CM | POA: Diagnosis not present

## 2020-12-05 DIAGNOSIS — Z992 Dependence on renal dialysis: Secondary | ICD-10-CM

## 2020-12-05 DIAGNOSIS — A419 Sepsis, unspecified organism: Principal | ICD-10-CM | POA: Diagnosis present

## 2020-12-05 DIAGNOSIS — Z20822 Contact with and (suspected) exposure to covid-19: Secondary | ICD-10-CM | POA: Diagnosis present

## 2020-12-05 DIAGNOSIS — I120 Hypertensive chronic kidney disease with stage 5 chronic kidney disease or end stage renal disease: Secondary | ICD-10-CM | POA: Diagnosis present

## 2020-12-05 DIAGNOSIS — K219 Gastro-esophageal reflux disease without esophagitis: Secondary | ICD-10-CM | POA: Diagnosis present

## 2020-12-05 DIAGNOSIS — E1129 Type 2 diabetes mellitus with other diabetic kidney complication: Secondary | ICD-10-CM | POA: Diagnosis present

## 2020-12-05 DIAGNOSIS — E876 Hypokalemia: Secondary | ICD-10-CM | POA: Diagnosis present

## 2020-12-05 DIAGNOSIS — K529 Noninfective gastroenteritis and colitis, unspecified: Secondary | ICD-10-CM | POA: Diagnosis present

## 2020-12-05 DIAGNOSIS — Z720 Tobacco use: Secondary | ICD-10-CM | POA: Diagnosis present

## 2020-12-05 DIAGNOSIS — E1165 Type 2 diabetes mellitus with hyperglycemia: Secondary | ICD-10-CM | POA: Diagnosis present

## 2020-12-05 DIAGNOSIS — F32A Depression, unspecified: Secondary | ICD-10-CM | POA: Diagnosis present

## 2020-12-05 DIAGNOSIS — E871 Hypo-osmolality and hyponatremia: Secondary | ICD-10-CM | POA: Diagnosis present

## 2020-12-05 DIAGNOSIS — Z794 Long term (current) use of insulin: Secondary | ICD-10-CM

## 2020-12-05 DIAGNOSIS — E785 Hyperlipidemia, unspecified: Secondary | ICD-10-CM | POA: Diagnosis present

## 2020-12-05 DIAGNOSIS — Z7984 Long term (current) use of oral hypoglycemic drugs: Secondary | ICD-10-CM

## 2020-12-05 LAB — GASTROINTESTINAL PANEL BY PCR, STOOL (REPLACES STOOL CULTURE)

## 2020-12-05 LAB — COMPREHENSIVE METABOLIC PANEL WITH GFR
ALT: 20 U/L (ref 0–44)
AST: 31 U/L (ref 15–41)
Albumin: 2.2 g/dL — ABNORMAL LOW (ref 3.5–5.0)
Alkaline Phosphatase: 135 U/L — ABNORMAL HIGH (ref 38–126)
Anion gap: 21 — ABNORMAL HIGH (ref 5–15)
BUN: 35 mg/dL — ABNORMAL HIGH (ref 8–23)
CO2: 22 mmol/L (ref 22–32)
Calcium: 7.3 mg/dL — ABNORMAL LOW (ref 8.9–10.3)
Chloride: 86 mmol/L — ABNORMAL LOW (ref 98–111)
Creatinine, Ser: 8.74 mg/dL — ABNORMAL HIGH (ref 0.44–1.00)
GFR, Estimated: 4 mL/min — ABNORMAL LOW
Glucose, Bld: 167 mg/dL — ABNORMAL HIGH (ref 70–99)
Potassium: 3.2 mmol/L — ABNORMAL LOW (ref 3.5–5.1)
Sodium: 129 mmol/L — ABNORMAL LOW (ref 135–145)
Total Bilirubin: 0.5 mg/dL (ref 0.3–1.2)
Total Protein: 6 g/dL — ABNORMAL LOW (ref 6.5–8.1)

## 2020-12-05 LAB — MAGNESIUM: Magnesium: 1.5 mg/dL — ABNORMAL LOW (ref 1.7–2.4)

## 2020-12-05 LAB — LACTIC ACID, PLASMA
Lactic Acid, Venous: 2.9 mmol/L (ref 0.5–1.9)
Lactic Acid, Venous: 1.4 mmol/L (ref 0.5–1.9)

## 2020-12-05 LAB — BASIC METABOLIC PANEL
Anion gap: 18 — ABNORMAL HIGH (ref 5–15)
Anion gap: 15 (ref 5–15)
BUN: 36 mg/dL — ABNORMAL HIGH (ref 8–23)
BUN: 39 mg/dL — ABNORMAL HIGH (ref 8–23)
CO2: 19 mmol/L — ABNORMAL LOW (ref 22–32)
CO2: 22 mmol/L (ref 22–32)
Calcium: 6.3 mg/dL — CL (ref 8.9–10.3)
Calcium: 6.9 mg/dL — ABNORMAL LOW (ref 8.9–10.3)
Chloride: 92 mmol/L — ABNORMAL LOW (ref 98–111)
Chloride: 92 mmol/L — ABNORMAL LOW (ref 98–111)
Creatinine, Ser: 8.19 mg/dL — ABNORMAL HIGH (ref 0.44–1.00)
Creatinine, Ser: 8.48 mg/dL — ABNORMAL HIGH (ref 0.44–1.00)
GFR, Estimated: 5 mL/min — ABNORMAL LOW (ref >60)
GFR, Estimated: 4 mL/min — ABNORMAL LOW (ref >60)
Glucose, Bld: 126 mg/dL — ABNORMAL HIGH (ref 70–99)
Glucose, Bld: 148 mg/dL — ABNORMAL HIGH (ref 70–99)
Potassium: 3.5 mmol/L (ref 3.5–5.1)
Potassium: 3.2 mmol/L — ABNORMAL LOW (ref 3.5–5.1)
Sodium: 129 mmol/L — ABNORMAL LOW (ref 135–145)
Sodium: 129 mmol/L — ABNORMAL LOW (ref 135–145)

## 2020-12-05 LAB — C DIFFICILE QUICK SCREEN W PCR REFLEX
C Diff antigen: POSITIVE — AB
C Diff toxin: NEGATIVE

## 2020-12-05 LAB — SARS CORONAVIRUS 2 (TAT 6-24 HRS): SARS Coronavirus 2: NEGATIVE

## 2020-12-05 LAB — CBC
HCT: 42.4 % (ref 36.0–46.0)
Hemoglobin: 13.5 g/dL (ref 12.0–15.0)
MCH: 29.5 pg (ref 26.0–34.0)
MCHC: 31.8 g/dL (ref 30.0–36.0)
MCV: 92.8 fL (ref 80.0–100.0)
Platelets: 301 K/uL (ref 150–400)
RBC: 4.57 MIL/uL (ref 3.87–5.11)
RDW: 18.7 % — ABNORMAL HIGH (ref 11.5–15.5)
WBC: 13.6 K/uL — ABNORMAL HIGH (ref 4.0–10.5)
nRBC: 0.1 % (ref 0.0–0.2)

## 2020-12-05 LAB — GLUCOSE, CAPILLARY
Glucose-Capillary: 104 mg/dL — ABNORMAL HIGH (ref 70–99)
Glucose-Capillary: 117 mg/dL — ABNORMAL HIGH (ref 70–99)
Glucose-Capillary: 96 mg/dL (ref 70–99)
Glucose-Capillary: 91 mg/dL (ref 70–99)

## 2020-12-05 LAB — CK: Total CK: 43 U/L (ref 38–234)

## 2020-12-05 LAB — PROCALCITONIN: Procalcitonin: 4.99 ng/mL

## 2020-12-05 LAB — CLOSTRIDIUM DIFFICILE BY PCR, REFLEXED: Toxigenic C. Difficile by PCR: NEGATIVE

## 2020-12-05 LAB — LIPASE, BLOOD: Lipase: 56 U/L — ABNORMAL HIGH (ref 11–51)

## 2020-12-05 MED ORDER — UMECLIDINIUM BROMIDE 62.5 MCG/INH IN AEPB
1.0000 | INHALATION_SPRAY | Freq: Every day | RESPIRATORY_TRACT | Status: DC
  Administered 2020-12-06 – 2020-12-07 (×2): 1 via RESPIRATORY_TRACT
  Filled 2020-12-05 (×2): qty 7

## 2020-12-05 MED ORDER — PIPERACILLIN-TAZOBACTAM IN DEX 2-0.25 GM/50ML IV SOLN
2.2500 g | Freq: Three times a day (TID) | INTRAVENOUS | Status: DC
  Administered 2020-12-05 – 2020-12-07 (×5): 2.25 g via INTRAVENOUS
  Filled 2020-12-05 (×10): qty 50

## 2020-12-05 MED ORDER — INSULIN ASPART 100 UNIT/ML ~~LOC~~ SOLN
0.0000 [IU] | Freq: Three times a day (TID) | SUBCUTANEOUS | Status: DC
  Administered 2020-12-06: 2 [IU] via SUBCUTANEOUS
  Administered 2020-12-06: 3 [IU] via SUBCUTANEOUS
  Administered 2020-12-06: 1 [IU] via SUBCUTANEOUS
  Administered 2020-12-07: 9 [IU] via SUBCUTANEOUS
  Filled 2020-12-05 (×4): qty 1

## 2020-12-05 MED ORDER — RENA-VITE PO TABS
1.0000 | ORAL_TABLET | Freq: Every day | ORAL | Status: DC
  Administered 2020-12-06 – 2020-12-08 (×3): 1 via ORAL
  Filled 2020-12-05 (×4): qty 1

## 2020-12-05 MED ORDER — FLUTICASONE-UMECLIDIN-VILANT 100-62.5-25 MCG/INH IN AEPB: 1.0000 | INHALATION_SPRAY | Freq: Every day | RESPIRATORY_TRACT | Status: DC

## 2020-12-05 MED ORDER — POTASSIUM CHLORIDE 10 MEQ/100ML IV SOLN
10.0000 meq | INTRAVENOUS | Status: AC
Start: 2020-12-05 — End: 2020-12-05
  Administered 2020-12-05 (×2): 10 meq via INTRAVENOUS
  Filled 2020-12-05 (×2): qty 100

## 2020-12-05 MED ORDER — CALCIUM ACETATE (PHOS BINDER) 667 MG PO CAPS
667.0000 mg | ORAL_CAPSULE | Freq: Two times a day (BID) | ORAL | Status: DC | PRN
  Administered 2020-12-07: 667 mg via ORAL

## 2020-12-05 MED ORDER — MORPHINE SULFATE (PF) 2 MG/ML IV SOLN: 0.5000 mg | INTRAVENOUS | Status: DC | PRN

## 2020-12-05 MED ORDER — INSULIN ASPART 100 UNIT/ML ~~LOC~~ SOLN
0.0000 [IU] | Freq: Every day | SUBCUTANEOUS | Status: DC
  Administered 2020-12-06 – 2020-12-07 (×2): 2 [IU] via SUBCUTANEOUS
  Filled 2020-12-05 (×2): qty 1

## 2020-12-05 MED ORDER — FENTANYL CITRATE (PF) 100 MCG/2ML IJ SOLN
25.0000 ug | Freq: Once | INTRAMUSCULAR | Status: AC
  Administered 2020-12-05: 25 ug via INTRAVENOUS
  Filled 2020-12-05: qty 2

## 2020-12-05 MED ORDER — LIP MEDEX EX OINT
TOPICAL_OINTMENT | CUTANEOUS | Status: DC | PRN
  Filled 2020-12-05: qty 7

## 2020-12-05 MED ORDER — HYDROXYZINE HCL 50 MG/ML IM SOLN
25.0000 mg | Freq: Four times a day (QID) | INTRAMUSCULAR | Status: DC | PRN
  Filled 2020-12-05 (×2): qty 0.5

## 2020-12-05 MED ORDER — CALCIUM GLUCONATE-NACL 1-0.675 GM/50ML-% IV SOLN
1.0000 g | Freq: Once | INTRAVENOUS | Status: AC
  Administered 2020-12-05: 1000 mg via INTRAVENOUS
  Filled 2020-12-05: qty 50

## 2020-12-05 MED ORDER — HYDRALAZINE HCL 20 MG/ML IJ SOLN: 5.0000 mg | INTRAMUSCULAR | Status: DC | PRN

## 2020-12-05 MED ORDER — MAGNESIUM SULFATE IN D5W 1-5 GM/100ML-% IV SOLN
1.0000 g | Freq: Once | INTRAVENOUS | Status: AC
  Administered 2020-12-05: 1 g via INTRAVENOUS
  Filled 2020-12-05: qty 100

## 2020-12-05 MED ORDER — NICOTINE 21 MG/24HR TD PT24
21.0000 mg | MEDICATED_PATCH | Freq: Every day | TRANSDERMAL | Status: DC
  Administered 2020-12-05 – 2020-12-08 (×4): 21 mg via TRANSDERMAL
  Filled 2020-12-05 (×4): qty 1

## 2020-12-05 MED ORDER — MOMETASONE FURO-FORMOTEROL FUM 200-5 MCG/ACT IN AERO
2.0000 | INHALATION_SPRAY | Freq: Two times a day (BID) | RESPIRATORY_TRACT | Status: DC
  Administered 2020-12-05 – 2020-12-07 (×3): 2 via RESPIRATORY_TRACT
  Filled 2020-12-05: qty 8.8

## 2020-12-05 MED ORDER — ONDANSETRON HCL 4 MG/2ML IJ SOLN
4.0000 mg | Freq: Once | INTRAMUSCULAR | Status: AC
  Administered 2020-12-05: 4 mg via INTRAVENOUS
  Filled 2020-12-05: qty 2

## 2020-12-05 MED ORDER — HYDROCORTISONE 0.5 % EX CREA
TOPICAL_CREAM | Freq: Four times a day (QID) | CUTANEOUS | Status: DC | PRN
  Filled 2020-12-05: qty 28.35

## 2020-12-05 MED ORDER — PANTOPRAZOLE SODIUM 40 MG PO TBEC
40.0000 mg | DELAYED_RELEASE_TABLET | Freq: Every day | ORAL | Status: DC
  Administered 2020-12-05 – 2020-12-08 (×4): 40 mg via ORAL
  Filled 2020-12-05 (×5): qty 1

## 2020-12-05 MED ORDER — POTASSIUM CHLORIDE CRYS ER 20 MEQ PO TBCR
40.0000 meq | EXTENDED_RELEASE_TABLET | Freq: Once | ORAL | Status: AC
  Administered 2020-12-05: 40 meq via ORAL
  Filled 2020-12-05: qty 2

## 2020-12-05 MED ORDER — ACETAMINOPHEN 325 MG PO TABS: 650.0000 mg | ORAL_TABLET | Freq: Four times a day (QID) | ORAL | Status: DC | PRN

## 2020-12-05 MED ORDER — OXYCODONE-ACETAMINOPHEN 5-325 MG PO TABS
1.0000 | ORAL_TABLET | ORAL | Status: DC | PRN
  Administered 2020-12-05 – 2020-12-08 (×7): 1 via ORAL
  Filled 2020-12-05 (×7): qty 1

## 2020-12-05 MED ORDER — SODIUM CHLORIDE 0.9 % IV SOLN: 2.0000 g | Freq: Once | INTRAVENOUS | Status: DC

## 2020-12-05 MED ORDER — MIRTAZAPINE 15 MG PO TABS
7.5000 mg | ORAL_TABLET | Freq: Every day | ORAL | Status: DC
  Administered 2020-12-05 – 2020-12-07 (×3): 7.5 mg via ORAL
  Filled 2020-12-05 (×3): qty 1

## 2020-12-05 MED ORDER — PROSOURCE PLUS PO LIQD
30.0000 mL | Freq: Two times a day (BID) | ORAL | Status: DC
  Administered 2020-12-06 – 2020-12-08 (×3): 30 mL via ORAL
  Filled 2020-12-05 (×6): qty 30

## 2020-12-05 MED ORDER — HYDROXYZINE HCL 10 MG PO TABS
10.0000 mg | ORAL_TABLET | Freq: Three times a day (TID) | ORAL | Status: DC | PRN
  Filled 2020-12-05: qty 1

## 2020-12-05 MED ORDER — BOOST / RESOURCE BREEZE PO LIQD CUSTOM
1.0000 | Freq: Two times a day (BID) | ORAL | Status: DC
  Administered 2020-12-05 – 2020-12-08 (×5): 1 via ORAL

## 2020-12-05 MED ORDER — ESCITALOPRAM OXALATE 10 MG PO TABS
10.0000 mg | ORAL_TABLET | Freq: Every day | ORAL | Status: DC
  Administered 2020-12-05 – 2020-12-08 (×4): 10 mg via ORAL
  Filled 2020-12-05 (×4): qty 1

## 2020-12-05 MED ORDER — HEPARIN SODIUM (PORCINE) 5000 UNIT/ML IJ SOLN
5000.0000 [IU] | Freq: Three times a day (TID) | INTRAMUSCULAR | Status: DC
  Administered 2020-12-05 – 2020-12-08 (×8): 5000 [IU] via SUBCUTANEOUS
  Filled 2020-12-05 (×8): qty 1

## 2020-12-05 MED ORDER — ASPIRIN 81 MG PO CHEW
81.0000 mg | CHEWABLE_TABLET | Freq: Every day | ORAL | Status: DC
Start: 2020-12-05 — End: 2020-12-08
  Administered 2020-12-05 – 2020-12-08 (×4): 81 mg via ORAL
  Filled 2020-12-05 (×5): qty 1

## 2020-12-05 MED ORDER — ATORVASTATIN CALCIUM 20 MG PO TABS
40.0000 mg | ORAL_TABLET | Freq: Every day | ORAL | Status: DC
  Administered 2020-12-05 – 2020-12-07 (×3): 40 mg via ORAL
  Filled 2020-12-05 (×3): qty 2

## 2020-12-05 MED ORDER — SODIUM CHLORIDE 0.9 % IV BOLUS
500.0000 mL | Freq: Once | INTRAVENOUS | Status: AC
  Administered 2020-12-05: 500 mL via INTRAVENOUS

## 2020-12-05 MED ORDER — CALCIUM ACETATE (PHOS BINDER) 667 MG PO CAPS
1334.0000 mg | ORAL_CAPSULE | Freq: Three times a day (TID) | ORAL | Status: DC
  Administered 2020-12-05 – 2020-12-08 (×8): 1334 mg via ORAL
  Filled 2020-12-05 (×8): qty 2

## 2020-12-05 MED ORDER — ALBUTEROL SULFATE HFA 108 (90 BASE) MCG/ACT IN AERS
2.0000 | INHALATION_SPRAY | RESPIRATORY_TRACT | Status: DC | PRN
  Filled 2020-12-05: qty 6.7

## 2020-12-05 NOTE — ED Triage Notes (Signed)
Pt arrives via pov from home where she lives with husband and daughter. Pt reports N/V/D starting last Friday, unable to keep anything down, LLQ pain. Pt reports some dizziness when standing up fast. Pt a&o x 4 NAD noted in triage. Pt uses peritoneal dialysis nightly at home

## 2020-12-05 NOTE — Progress Notes (Signed)
Pharmacy Antibiotic Note  Anita Wells is a 80 y.o. female admitted on 12/05/2020 with IAI-?colitis.  Pharmacy has been consulted for Zosyn dosing. -Peritoneal dialysis pt  Plan: Zosyn 2.25 gm IV q8h.   Height: '5\' 7"'$  (170.2 cm) Weight: 51.5 kg (113 lb 8.6 oz) IBW/kg (Calculated) : 61.6  Temp (24hrs), Avg:97.6 F (36.4 C), Min:97.6 F (36.4 C), Max:97.6 F (36.4 C)  Recent Labs  Lab 12/05/20 0818  WBC 13.6*  CREATININE 8.74*    Estimated Creatinine Clearance: 4.1 mL/min (A) (by C-G formula based on SCr of 8.74 mg/dL (H)).    Allergies  Allergen Reactions  . Clopidogrel Swelling    Mouth swelling  . Ace Inhibitors Cough  . Amlodipine Other (See Comments)    Peripheral edema    Antimicrobials this admission: Zosyn 2/11 >>       >>    Dose adjustments this admission:    Microbiology results: 2/11 BCx: pend   UCx:      Sputum:      MRSA PCR:   2/11 GI panel pend 2/11 Cdiff pend  Thank you for allowing pharmacy to be a part of this patient's care.  Donique Hammonds A 12/05/2020 11:20 AM

## 2020-12-05 NOTE — ED Notes (Addendum)
Date and time results received: 12/05/20 1130  Test: Calcium Critical Value: 6.3  Name of Provider Notified: Dr Blaine Hamper MD  No new orders at this time d/t to being a dialysis patient.

## 2020-12-05 NOTE — H&P (Signed)
History and Physical    Anita Wells XHB:[id number] DOB: 05/05/1941 DOA: 12/05/2020  Referring MD/NP/PA:   PCP: Dion Body, MD   Patient coming from:  The patient is coming from home.  At baseline, pt is partially dependent for most of ADL.        Chief Complaint: Nausea, vomiting, diarrhea, abdominal pain  HPI: Anita Wells is a 80 y.o. female with medical history significant of ESRD-peritoneal dialysis nightly, hypertension, hyperlipidemia, diabetes mellitus, GERD, depression, renal artery stenosis, anemia, bilateral carotid artery stenosis, diverticulitis, tobacco abuse, who presents with nausea, vomiting, diarrhea and abdominal pain.  Patient states that she has been having nausea, vomiting, diarrhea, abdominal pain for almost a week.  She states that initally she has multiple episodes of nonbilious nonbloody vomiting each day, which has improved today, did not have vomiting today.  She has watery diarrhea, more than 5 times per day. Her abdominal pain is located in the left lower quadrant, constant, mild to moderate, sharp, nonradiating. Patient has lightheadedness. Denies chest pain, shortness breath, cough, fever or chills.  No symptoms of UTI or unilateral weakness. Of note, pt had peritoneal fluid culture done by Dr. Holley Raring in dialysis center 12/01/2020, NGTD.     ED Course: pt was found to have WBC 17.6, pending Covid PCR, lipase 56, pending urinalysis, potassium 3.2, sodium 129, bicarbonate 22, creatinine 8.74, BUN 35, temperature normal, blood pressure 94/76, 120/70, heart rate 108, RR 20, oxygen saturation 99% on room air.  CT abdomen/pelvis showed colitis in the transverse and proximal descending colon. Patient is placed on MedSurg bed for observation. Dr. Holley Raring of renal is consulted.  CT-abd.pelvis: Mild thickening in the distal transverse and proximal descending colon consistent with focal colitis. No abscess or perforation is noted.  Considerable free fluid  within the abdomen consistent with peritoneal dialysis.  Bilateral nonobstructing renal calculi.  Diffuse chronic atherosclerotic changes in the distal descending thoracic aorta and infrarenal abdominal aorta without aneurysmal dilatation.   Review of Systems:   General: no fevers, chills, no body weight gain, has poor appetite, has fatigue HEENT: no blurry vision, hearing changes or sore throat Respiratory: no dyspnea, coughing, wheezing CV: no chest pain, no palpitations GI: has nausea, vomiting, abdominal pain, diarrhea, no constipation GU: no dysuria, burning on urination, increased urinary frequency, hematuria  Ext: no leg edema Neuro: no unilateral weakness, numbness, or tingling, no vision change or hearing loss.  Has lightheadedness. Skin: no rash, no skin tear. MSK: No muscle spasm, no deformity, no limitation of range of movement in spin Heme: No easy bruising.  Travel history: No recent long distant travel.  Allergy:  Allergies  Allergen Reactions  . Clopidogrel Swelling    Mouth swelling  . Ace Inhibitors Cough  . Amlodipine Other (See Comments)    Peripheral edema    Past Medical History:  Diagnosis Date  . Anemia   . Barrett's esophagus   . Carotid stenosis, bilateral   . Chronic kidney disease   . GERD (gastroesophageal reflux disease)   . Hyperlipidemia   . Hyperparathyroidism ([place])   . Hypertension   . Renal artery stenosis (HCC)    left    Past Surgical History:  Procedure Laterality Date  . A/V FISTULAGRAM Left 02/19/2019   Procedure: A/V FISTULAGRAM;  Surgeon: Algernon Huxley, MD;  Location: [facility];  Service: Cardiovascular;  Laterality: Left;  . AV FISTULA PLACEMENT  12/21/2018   Procedure: ARTERIOVENOUS (AV) FISTULA CREATION;  Surgeon: Leotis Pain  S, MD;  Location: [facility];  Service: Vascular;;  . COLON RESECTION    . COLON SURGERY    . colonoscpy    . DIALYSIS/PERMA CATHETER INSERTION N/A 11/15/2018   Procedure:  DIALYSIS/PERMA CATHETER INSERTION;  Surgeon: Algernon Huxley, MD;  Location: [facility];  Service: Cardiovascular;  Laterality: N/A;  . DIALYSIS/PERMA CATHETER REMOVAL N/A 09/27/2019   Procedure: DIALYSIS/PERMA CATHETER REMOVAL;  Surgeon: Algernon Huxley, MD;  Location: [facility];  Service: Cardiovascular;  Laterality: N/A;  . RENAL ARTERY STENT Left     Social History:  reports that she has been smoking cigarettes. She has a 12.50 pack-year smoking history. She has never used smokeless tobacco. She reports previous alcohol use. She reports that she does not use drugs.  Family History:  Family History  Problem Relation Age of Onset  . Diabetes Mellitus II Mother      Prior to Admission medications   Medication Sig Start Date End Date Taking? Authorizing Provider  amLODipine (NORVASC) 2.5 MG tablet Take 2.5 mg by mouth daily.    [provider]  aspirin 81 MG chewable tablet Chew by mouth daily.    [provider]  atorvastatin (LIPITOR) 40 MG tablet Take 40 mg by mouth at bedtime.     [provider]  blood glucose meter kit and supplies Dispense based on patient and insurance preference. Use up to four times daily as directed. (FOR ICD-10 E10.9, E11.9). 12/14/19   Jennye Boroughs, MD  calcium acetate (PHOSLO) 667 MG capsule Take by mouth See admin instructions. Take 2 capsules by mouth with each meal and then 1 capsule with 2 snacks a day    [provider]  escitalopram (LEXAPRO) 10 MG tablet Take 10 mg by mouth daily.    [provider]  ferric citrate (AURYXIA) 1 GM 210 MG(Fe) tablet Take by mouth.    [provider]  furosemide (LASIX) 40 MG tablet Take 80 mg by mouth daily. On non-dialysis days     [provider]  glipiZIDE (GLUCOTROL) 10 MG tablet Take 1 tablet (10 mg total) by mouth daily before breakfast. 12/15/19   Jennye Boroughs, MD  hydrALAZINE (APRESOLINE) 100 MG tablet Take 100 mg by mouth 3 (three)  times daily. Patient not taking: Reported on 09/23/2020 08/05/19   [provider]  metoprolol tartrate (LOPRESSOR) 100 MG tablet Take 100 mg by mouth 2 (two) times daily. Patient not taking: Reported on 09/23/2020    [provider]  mirtazapine (REMERON) 7.5 MG tablet Take 7.5 mg by mouth at bedtime.    [provider]  multivitamin (RENA-VIT) TABS tablet Take 1 tablet by mouth daily.     [provider]  omeprazole (PRILOSEC) 40 MG capsule Take 40 mg by mouth daily.    [provider]  Potassium Chloride ER 20 MEQ TBCR Take 1 tablet by mouth daily. 04/20/20   [provider]    Physical Exam: Vitals:   12/05/20 0812 12/05/20 0930 12/05/20 1000 12/05/20 1030  BP: 94/76 108/68 119/69 120/70  Pulse: 93 84 87 82  Resp: _0 Temp:      TempSrc:      SpO2: 98% 98% 99% 99%  Weight:      Height:       General: Not in acute distress HEENT:       Eyes: PERRL, EOMI, no scleral icterus.       ENT: No discharge from  the ears and nose, no pharynx injection, no tonsillar enlargement.        Neck: No JVD, no bruit, no mass felt. Heme: No neck lymph node enlargement. Cardiac: S1/S2, RRR, No murmurs, No gallops or rubs. Respiratory: No rales, wheezing, rhonchi or rubs. GI: Soft, nondistended, has tenderness in LLQ, no rebound pain, no organomegaly, BS present.  Peritoneal dialysis in place with clean surroundings. GU: No hematuria Ext: No pitting leg edema bilaterally. 1+DP/PT pulse bilaterally. Musculoskeletal: No joint deformities, No joint redness or warmth, no limitation of ROM in spin. Skin: No rashes.  Neuro: Alert, oriented X3, cranial nerves II-XII grossly intact, moves all extremities normally. Psych: Patient is not psychotic, no suicidal or hemocidal ideation.  Labs on Admission: I have personally reviewed following labs and imaging studies  CBC: Recent Labs  Lab 12/05/20 0818  WBC 13.6*  HGB 13.5  HCT 42.4  MCV  92.8  PLT 263   Basic Metabolic Panel: Recent Labs  Lab 12/05/20 0818  NA 129*  K 3.2*  CL 86*  CO2 22  GLUCOSE 167*  BUN 35*  CREATININE 8.74*  CALCIUM 7.3*  MG 1.5*   GFR: Estimated Creatinine Clearance: 4.1 mL/min (A) (by C-G formula based on SCr of 8.74 mg/dL (H)). Liver Function Tests: Recent Labs  Lab 12/05/20 0818  AST 31  ALT 20  ALKPHOS 135*  BILITOT 0.5  PROT 6.0*  ALBUMIN 2.2*   Recent Labs  Lab 12/05/20 0818  LIPASE 56*   No results for input(s): AMMONIA in the last 168 hours. Coagulation Profile: No results for input(s): INR, PROTIME in the last 168 hours. Cardiac Enzymes: Recent Labs  Lab 12/05/20 0818  CKTOTAL 43   BNP (last 3 results) No results for input(s): PROBNP in the last 8760 hours. HbA1C: No results for input(s): HGBA1C in the last 72 hours. CBG: No results for input(s): GLUCAP in the last 168 hours. Lipid Profile: No results for input(s): CHOL, HDL, LDLCALC, TRIG, CHOLHDL, LDLDIRECT in the last 72 hours. Thyroid Function Tests: No results for input(s): TSH, T4TOTAL, FREET4, T3FREE, THYROIDAB in the last 72 hours. Anemia Panel: No results for input(s): VITAMINB12, FOLATE, FERRITIN, TIBC, IRON, RETICCTPCT in the last 72 hours. Urine analysis: No results found for: COLORURINE, APPEARANCEUR, LABSPEC, PHURINE, GLUCOSEU, HGBUR, BILIRUBINUR, KETONESUR, PROTEINUR, UROBILINOGEN, NITRITE, LEUKOCYTESUR Sepsis Labs: _0 (procalcitonin:4,lacticidven:4) )No results found for this or any previous visit (from the past 240 hour(s)).   Radiological Exams on Admission: CT ABDOMEN PELVIS WO CONTRAST  Result Date: 12/05/2020 CLINICAL DATA:  Left lower quadrant pain and diarrhea EXAM: CT ABDOMEN AND PELVIS WITHOUT CONTRAST TECHNIQUE: Multidetector CT imaging of the abdomen and pelvis was performed following the standard protocol without IV contrast. COMPARISON:  10/17/2008 FINDINGS: Lower chest: Lung bases show no acute abnormality.  Emphysematous changes are seen. Hepatobiliary: Gallbladder has been surgically removed. Liver is within normal limits. Pancreas: Unremarkable. No pancreatic ductal dilatation or surrounding inflammatory changes. Spleen: Normal in size without focal abnormality. Adrenals/Urinary Tract: Adrenal glands are unremarkable. Kidneys demonstrate bilateral cystic change as well as nonobstructing calculi bilaterally. These lie predominately in the lower pole measuring approximately 4 mm bilaterally. No obstructive changes are seen. The bladder is decompressed. Stomach/Bowel: Postsurgical changes are noted in the rectosigmoid region. No obstructive changes are seen. Mild wall thickening is noted within the distal transverse and proximal descending colon likely representing some focal colitis. The more proximal colon is within normal limits. The appendix is unremarkable. No small bowel abnormality is seen. The stomach is  decompressed. Vascular/Lymphatic: Atherosclerotic changes of the abdominal aorta are noted. Mild prominence of the distal descending thoracic aorta is noted at 3 cm with changes of prior chronic dissection. Similar findings are noted in the distal infrarenal aorta measuring up to 2.5 cm. No aneurysmal dilatation is seen. No sizable adenopathy is noted. Reproductive: Uterus and bilateral adnexa are unremarkable. Other: Free fluid is noted within the abdomen and pelvis consistent with the known peritoneal dialysis catheter. Catheter is noted deep in the pelvis. No other focal abnormality is noted. Musculoskeletal: Degenerative changes of lumbar spine are seen. No acute bony abnormality is noted. IMPRESSION: Mild thickening in the distal transverse and proximal descending colon consistent with focal colitis. No abscess or perforation is noted. Considerable free fluid within the abdomen consistent with peritoneal dialysis. Bilateral nonobstructing renal calculi. Diffuse chronic atherosclerotic changes in the distal  descending thoracic aorta and infrarenal abdominal aorta without aneurysmal dilatation. Electronically Signed   By: Inez Catalina M.D.   On: 12/05/2020 09:49     EKG: I have personally reviewed.  Sinus rhythm, QTC 534, bilateral atrial enlargement, low voltage, poor R wave progression   Assessment/Plan Principal Problem:   Acute colitis Active Problems:   Essential hypertension   Hyperlipidemia   Hypokalemia   ESRD on peritoneal dialysis ([place])   Type II diabetes mellitus with renal manifestations (HCC)   Sepsis (HCC)   Hyponatremia   GERD (gastroesophageal reflux disease)   Depression   Tobacco abuse   Anemia in ESRD (end-stage renal disease) ([place])   Hypomagnesemia   Sepsis due to acute colitis: CT scan showed mild thickening in the distal transverse and proximal descending colon consistent with focal colitis. No abscess or perforation is noted. Pt meets criteria for sepsis with leukocytosis with WBC 13.6, tachycardia with heart rate of 108.  Pending lactic acid.  Blood pressures are soft.  Currently hemodynamically stable  -Placed on MedSurg bed for observation -IV Zosyn -Follow-up blood culture -Check C. difficile PCR and GI pathogen panel -As needed morphine and Percocet for pain -As needed hydroxyzine for nausea -will get Procalcitonin and trend lactic acid levels per sepsis protocol. -IVF: 500 cc of NS is given in ED (patient has ESRD, limiting aggressive IV fluids treatment).  Essential hypertension: Blood pressure soft -IV hydralazine as needed -Hold home blood pressure medications  Hyperlipidemia -Lipitor  Hypokalemia and hypomagnesemia: Potassium 3.2 and magnesium 1.5.  on admission. - Repleted both  Hyponatremia: Sodium 129, most likely due to nausea vomiting diarrhea -Patient received 500 cc normal saline in ED -Check BMP every 8 hour  ESRD on peritoneal dialysis [facility]) -Dr. Holley Raring of renal is consulted for dialysis  Type II diabetes mellitus with renal  manifestations [facility]): Recent A1c 13.0, poorly controlled.  Patient is taking glipizide at home -Sliding scale insulin  GERD (gastroesophageal reflux disease) -Protonix  Depression -Lexapro, Remeron  Tobacco abuse -Nicotine patch  Anemia in ESRD (end-stage renal disease) ([place]): Hemoglobin stable 13.5 -Follow-up by CBC      DVT ppx: SQ Heparin    Code Status: Full code Family Communication:   Yes, patient's   daughter at bed side Disposition Plan:  Anticipate discharge back to previous environment Consults called:  Dr. Holley Raring of renal Admission status and Level of care: Med-Surg:   for obs    Status is: Observation  The patient remains OBS appropriate and will d/c before 2 midnights.  Dispo: The patient is from: Home  Anticipated d/c is to: Home                 Anticipated d/c date is: 1 day              Patient currently is not medically stable to d/c.   Difficult to place patient No            Date of Service 12/05/2020    [facility]   If 7PM-7AM, please contact night-coverage [email or web address] 12/05/2020, 11:51 AM

## 2020-12-05 NOTE — Progress Notes (Signed)
Initial Nutrition Assessment  DOCUMENTATION CODES:   Underweight  INTERVENTION:   -Renal MVI daily -Boost Breeze po BID, each supplement provides 250 kcal and 9 grams of protein -30 ml Prosource Plus BID, each supplement provides 100 kcals and 15 grams protein -Magic cup BID with meals, each supplement provides 290 kcal and 9 grams of protein  NUTRITION DIAGNOSIS:   Increased nutrient needs related to chronic illness (ESRD on PD) as evidenced by estimated needs.  GOAL:   Patient will meet greater than or equal to 90% of their needs  MONITOR:   PO intake,Supplement acceptance,Labs,Weight trends,Skin,I & O's  REASON FOR ASSESSMENT:   Malnutrition Screening Tool    ASSESSMENT:   Anita Wells is a 80 y.o. female with medical history significant of ESRD-peritoneal dialysis nightly, hypertension, hyperlipidemia, diabetes mellitus, GERD, depression, renal artery stenosis, anemia, bilateral carotid artery stenosis, diverticulitis, tobacco abuse, who presents with nausea, vomiting, diarrhea and abdominal pain.  Pt admitted with sepsis secondary to acute colitis.   Reviewed I/O's: +500 ml x 24 hours  Pt unavailable at time of visit.   No meal completion data available at this time.  Reviewed wt hx; pt has experienced a 17.5% wt loss over the past year. While this is not significant for time frame, it is concerning given pt's advanced age, underweight status, and multiple co-morbidities. No nephrology note currently available at this time; unsure of EDW.  Highly suspect pt with malnutrition, however, unable to identify at this time. Pt would greatly benefit from addition of oral nutrition supplements.   Medications reviewed and include phoslo   Lab Results  Component Value Date   HGBA1C 13.0 (H) 12/12/2019   PTA DM medications are 10 mg glipizide daily.   Labs reviewed: Na: 129, CBGS: 104-117 (inpatient orders for glycemic control are 0-5 units insulin aspart daily at  bedtime and 0-9 units insulin aspart TID with meals).   Diet Order:   Diet Order            Diet renal/carb modified with fluid restriction Diet-HS Snack? Nothing; Fluid restriction: 1200 mL Fluid; Room service appropriate? Yes; Fluid consistency: Thin  Diet effective now                 EDUCATION NEEDS:   No education needs have been identified at this time  Skin:  Skin Assessment: Reviewed RN Assessment  Last BM:  12/04/20  Height:   Ht Readings from Last 1 Encounters:  12/05/20 '5\' 7"'$  (1.702 m)    Weight:   Wt Readings from Last 1 Encounters:  12/05/20 51.5 kg    Ideal Body Weight:  61.4 kg  BMI:  Body mass index is 17.78 kg/m.  Estimated Nutritional Needs:   Kcal:  1800-2000  Protein:  90-105 grams  Fluid:  1000 ml + UOP    Loistine Chance, RD, LDN, [place] Registered Dietitian II Certified Diabetes Care and Education Specialist Please refer to [facility] for RD and/or RD on-call/weekend/after hours pager

## 2020-12-05 NOTE — ED Provider Notes (Signed)
[facility] Emergency Department Provider Note   ____________________________________________   Event Date/Time   First MD Initiated Contact with Patient 12/05/20 [phone]     (approximate)  I have reviewed the triage vital signs and the nursing notes.   HISTORY  Chief Complaint Emesis, Diarrhea, and Abdominal Pain    HPI Anita Wells is a 80 y.o. female who is on peritoneal dialysis chronic kidney disease  Presents today she has been having pain in her left lower abdomen for a couple of days been having very loose watery stools since Friday.  He had a couple episodes of vomiting.  No shortness of breath no chest pain.  Low-grade fever yesterday about 14 Fahrenheit  She endorses moderate pain in her left lower quadrant associated with loose stools.  She has not vomited today, yesterday she was only able to eat a small amount of applesauce  She reports similar symptoms in the past when she had an episode of diverticulitis, but also reports that her doctor Dr. Call would placed her on antibiotic for possible infection around her peritoneal dialysis catheter few days ago.  She reports she had fluids removed and sent for analysis at the dialysis center yesterday   Past Medical History:  Diagnosis Date  . Anemia   . Barrett's esophagus   . Carotid stenosis, bilateral   . Chronic kidney disease   . GERD (gastroesophageal reflux disease)   . Hyperlipidemia   . Hyperparathyroidism ([place])   . Hypertension   . Renal artery stenosis [facility])    left    Patient Active Problem List   Diagnosis Date Noted  . Type 2 diabetes mellitus with hyperosmolar hyperglycemic state (HHS) ([place]) 12/11/2019  . Fluid overload 12/11/2019  . Hypokalemia 12/11/2019  . Leukocytosis 12/11/2019  . ESRD on peritoneal dialysis ([place])   . Shortness of breath 07/27/2019  . ESRD on dialysis ([place]) 11/22/2018  . Essential hypertension 11/22/2018  . Hyperlipidemia 11/22/2018  . Anemia due  to chronic kidney disease 11/12/2018    Past Surgical History:  Procedure Laterality Date  . A/V FISTULAGRAM Left 02/19/2019   Procedure: A/V FISTULAGRAM;  Surgeon: Algernon Huxley, MD;  Location: [facility];  Service: Cardiovascular;  Laterality: Left;  . AV FISTULA PLACEMENT  12/21/2018   Procedure: ARTERIOVENOUS (AV) FISTULA CREATION;  Surgeon: Algernon Huxley, MD;  Location: [facility] ORS;  Service: Vascular;;  . COLON RESECTION    . COLON SURGERY    . colonoscpy    . DIALYSIS/PERMA CATHETER INSERTION N/A 11/15/2018   Procedure: DIALYSIS/PERMA CATHETER INSERTION;  Surgeon: Algernon Huxley, MD;  Location: [facility];  Service: Cardiovascular;  Laterality: N/A;  . DIALYSIS/PERMA CATHETER REMOVAL N/A 09/27/2019   Procedure: DIALYSIS/PERMA CATHETER REMOVAL;  Surgeon: Algernon Huxley, MD;  Location: [facility];  Service: Cardiovascular;  Laterality: N/A;  . RENAL ARTERY STENT Left     Prior to Admission medications   Medication Sig Start Date End Date Taking? Authorizing Provider  amLODipine (NORVASC) 2.5 MG tablet Take 2.5 mg by mouth daily.    [provider]  aspirin 81 MG chewable tablet Chew by mouth daily.    [provider]  atorvastatin (LIPITOR) 40 MG tablet Take 40 mg by mouth at bedtime.     [provider]  blood glucose meter kit and supplies Dispense based on patient and insurance preference. Use up to four times daily as directed. (FOR ICD-10 E10.9, E11.9). 12/14/19  Jennye Boroughs, MD  calcium acetate (PHOSLO) 667 MG capsule Take by mouth See admin instructions. Take 2 capsules by mouth with each meal and then 1 capsule with 2 snacks a day    [provider]  escitalopram (LEXAPRO) 10 MG tablet Take 10 mg by mouth daily.    [provider]  ferric citrate (AURYXIA) 1 GM 210 MG(Fe) tablet Take by mouth.    [provider]  furosemide (LASIX) 40 MG tablet Take 80 mg by mouth daily. On non-dialysis days      [provider]  glipiZIDE (GLUCOTROL) 10 MG tablet Take 1 tablet (10 mg total) by mouth daily before breakfast. 12/15/19   Jennye Boroughs, MD  hydrALAZINE (APRESOLINE) 100 MG tablet Take 100 mg by mouth 3 (three) times daily. Patient not taking: Reported on 09/23/2020 08/05/19   [provider]  metoprolol tartrate (LOPRESSOR) 100 MG tablet Take 100 mg by mouth 2 (two) times daily. Patient not taking: Reported on 09/23/2020    [provider]  mirtazapine (REMERON) 7.5 MG tablet Take 7.5 mg by mouth at bedtime.    [provider]  multivitamin (RENA-VIT) TABS tablet Take 1 tablet by mouth daily.     [provider]  omeprazole (PRILOSEC) 40 MG capsule Take 40 mg by mouth daily.    [provider]  Potassium Chloride ER 20 MEQ TBCR Take 1 tablet by mouth daily. 04/20/20   [provider]    Allergies Clopidogrel, Ace inhibitors, and Amlodipine  No family history on file.  Social History Social History   Tobacco Use  . Smoking status: Current Every Day Smoker    Packs/day: 0.25    Years: 50.00    Pack years: 12.50    Types: Cigarettes  . Smokeless tobacco: Never Used  . Tobacco comment: She states she is ready to quit 09/15/2020.  Vaping Use  . Vaping Use: Never used  Substance Use Topics  . Alcohol use: Not Currently  . Drug use: Never    Review of Systems Constitutional: Fatigue, low-grade fever Eyes: No visual changes. ENT: No sore throat. Cardiovascular: Denies chest pain. Respiratory: Denies shortness of breath. Gastrointestinal: See HPI genitourinary: Negative for dysuria.  She reports that she rarely if ever urinates anymore Musculoskeletal: Negative for back pain. Skin: Negative for rash. Neurological: Negative for headaches, areas of focal weakness or numbness.    ____________________________________________   PHYSICAL EXAM:  VITAL SIGNS: ED Triage Vitals  Enc Vitals Group     BP 12/05/20 0812  94/76     Pulse Rate 12/05/20 0808 (!) 108     Resp 12/05/20 0812 18     Temp 12/05/20 0808 97.6 F (36.4 C)     Temp Source 12/05/20 0808 Oral     SpO2 12/05/20 0812 98 %     Weight 12/05/20 0809 113 lb 8.6 oz (51.5 kg)     Height 12/05/20 0809 '5\' 7"'  (1.702 m)     Head Circumference --      Peak Flow --      Pain Score 12/05/20 0809 7     Pain Loc --      Pain Edu? --      Excl. in [place]? --     Constitutional: Alert and oriented. Well appearing and in no acute distress.  She is very pleasant along with her daughter at the bedside. Eyes: Conjunctivae are normal. Head: Atraumatic. Nose: No congestion/rhinnorhea. Mouth/Throat: Mucous membranes are slightly dry. Neck: No stridor.  Cardiovascular: Normal rate, regular rhythm. Grossly normal heart sounds.  Good peripheral circulation. Respiratory: Normal respiratory effort.  No retractions. Lungs CTAB. Gastrointestinal: Soft and ports mild consistent tenderness in the left lower quadrant without frank rebound or guarding.  No obvious peritonitis but does demonstrate soreness.  Peritoneal dialysis catheter site located over the right mid abdomen dressing taken down and the site appears clean dry and intact without surrounding tenderness. No distention. Musculoskeletal: No lower extremity tenderness nor edema. Neurologic:  Normal speech and language. No gross focal neurologic deficits are appreciated.  Skin:  Skin is warm, dry and intact. No rash noted. Psychiatric: Mood and affect are normal. Speech and behavior are normal.  ____________________________________________   LABS (all labs ordered are listed, but only abnormal results are displayed)  Labs Reviewed  LIPASE, BLOOD - Abnormal; Notable for the following components:      Result Value   Lipase 56 (*)    All other components within normal limits  COMPREHENSIVE METABOLIC PANEL - Abnormal; Notable for the following components:   Sodium 129 (*)    Potassium 3.2 (*)    Chloride  86 (*)    Glucose, Bld 167 (*)    BUN 35 (*)    Creatinine, Ser 8.74 (*)    Calcium 7.3 (*)    Total Protein 6.0 (*)    Albumin 2.2 (*)    Alkaline Phosphatase 135 (*)    GFR, Estimated 4 (*)    Anion gap 21 (*)    All other components within normal limits  CBC - Abnormal; Notable for the following components:   WBC 13.6 (*)    RDW 18.7 (*)    All other components within normal limits  SARS CORONAVIRUS 2 (TAT 6-24 HRS)  GASTROINTESTINAL PANEL BY PCR, STOOL (REPLACES STOOL CULTURE)  C DIFFICILE QUICK SCREEN W PCR REFLEX  URINALYSIS, COMPLETE (UACMP) WITH MICROSCOPIC  MAGNESIUM  LACTIC ACID, PLASMA  CK   ____________________________________________  EKG  Reviewed interpreted at 815 Heart rate 100 QRS 80 QTc 550 Mild prolongation of QT interval, no evidence of acute ischemia  ____________________________________________  RADIOLOGY  CT ABDOMEN PELVIS WO CONTRAST  Result Date: 12/05/2020 CLINICAL DATA:  Left lower quadrant pain and diarrhea EXAM: CT ABDOMEN AND PELVIS WITHOUT CONTRAST TECHNIQUE: Multidetector CT imaging of the abdomen and pelvis was performed following the standard protocol without IV contrast. COMPARISON:  10/17/2008 FINDINGS: Lower chest: Lung bases show no acute abnormality. Emphysematous changes are seen. Hepatobiliary: Gallbladder has been surgically removed. Liver is within normal limits. Pancreas: Unremarkable. No pancreatic ductal dilatation or surrounding inflammatory changes. Spleen: Normal in size without focal abnormality. Adrenals/Urinary Tract: Adrenal glands are unremarkable. Kidneys demonstrate bilateral cystic change as well as nonobstructing calculi bilaterally. These lie predominately in the lower pole measuring approximately 4 mm bilaterally. No obstructive changes are seen. The bladder is decompressed. Stomach/Bowel: Postsurgical changes are noted in the rectosigmoid region. No obstructive changes are seen. Mild wall thickening is noted within  the distal transverse and proximal descending colon likely representing some focal colitis. The more proximal colon is within normal limits. The appendix is unremarkable. No small bowel abnormality is seen. The stomach is decompressed. Vascular/Lymphatic: Atherosclerotic changes of the abdominal aorta are noted. Mild prominence of the distal descending thoracic aorta is noted at 3 cm with changes of prior chronic dissection. Similar findings are noted in the distal infrarenal aorta measuring up to 2.5 cm. No aneurysmal dilatation is seen. No sizable adenopathy is noted. Reproductive: Uterus and bilateral  adnexa are unremarkable. Other: Free fluid is noted within the abdomen and pelvis consistent with the known peritoneal dialysis catheter. Catheter is noted deep in the pelvis. No other focal abnormality is noted. Musculoskeletal: Degenerative changes of lumbar spine are seen. No acute bony abnormality is noted. IMPRESSION: Mild thickening in the distal transverse and proximal descending colon consistent with focal colitis. No abscess or perforation is noted. Considerable free fluid within the abdomen consistent with peritoneal dialysis. Bilateral nonobstructing renal calculi. Diffuse chronic atherosclerotic changes in the distal descending thoracic aorta and infrarenal abdominal aorta without aneurysmal dilatation. Electronically Signed   By: Inez Catalina M.D.   On: 12/05/2020 09:49    CT reviewed mild thickening distal transverse and proximal descending colon.  Other findings as noted in report ____________________________________________   PROCEDURES  Procedure(s) performed: None  Procedures  Critical Care performed: No  ____________________________________________   INITIAL IMPRESSION / ASSESSMENT AND PLAN / ED COURSE  Pertinent labs & imaging results that were available during my care of the patient were reviewed by me and considered in my medical decision making (see chart for details).    Patient has leukocytosis, EKG reveals heart rate of 100.  In review of her imaging and her symptoms appears consistent with colitis.  She is also a peritoneal dialysis patient.  Thus far that has been negative on recent culture of her peritoneal fluid.  Patient captures sepsis criteria with elevated heart rate on arrival, leukocytosis, and colitis felt to be infectious by CT  Because of her tachycardia, leukocytosis, persistent symptoms and risk factors including being on peritoneal dialysis will start patient on antibiotic including cefepime as advised by nephrology.  Discussed with the patient and her family, comfortable with plan for admission.  Pain control with fentanyl, rehydration and nephrology will consult  Clinical Course as of 12/05/20 1055  Fri Dec 05, 2020  9937 Consult placed with Dr. Holley Raring [MQ]  1035 Peritoneal culture NGTD per Dr. Holley Raring from 12/01/2020 [MQ]  1055 Patient and daughter agreeable with plan for admission, understanding of plan for antibiotics and nephrology consultation [MQ]    Clinical Course User Index [MQ] Delman Kitten, MD     ____________________________________________   FINAL CLINICAL IMPRESSION(S) / ED DIAGNOSES  Final diagnoses:  Colitis  Sepsis, due to unspecified organism, unspecified whether acute organ dysfunction present [facility])        Note:  This document was prepared using Dragon voice recognition software and may include unintentional dictation errors       Delman Kitten, MD 12/05/20 1043

## 2020-12-05 NOTE — Progress Notes (Signed)
PHARMACY -  BRIEF ANTIBIOTIC NOTE   Pharmacy has received consult(s) for Cefepime from an ED provider.  The patient's profile has been reviewed for ht/wt/allergies/indication/available labs.    One time order(s) placed for Cefepime 2 gm IV x 1  Further antibiotics/pharmacy consults should be ordered by admitting physician if indicated.                       Thank you, Tico Crotteau A 12/05/2020  10:46 AM

## 2020-12-05 NOTE — ED Notes (Addendum)
Unable to do ED to IP handoff due to "inpatients nurses being in progression". ED charge RN notified.

## 2020-12-06 DIAGNOSIS — E876 Hypokalemia: Secondary | ICD-10-CM | POA: Diagnosis present

## 2020-12-06 DIAGNOSIS — E785 Hyperlipidemia, unspecified: Secondary | ICD-10-CM | POA: Diagnosis present

## 2020-12-06 DIAGNOSIS — Z794 Long term (current) use of insulin: Secondary | ICD-10-CM | POA: Diagnosis not present

## 2020-12-06 DIAGNOSIS — F17210 Nicotine dependence, cigarettes, uncomplicated: Secondary | ICD-10-CM | POA: Diagnosis present

## 2020-12-06 DIAGNOSIS — N2581 Secondary hyperparathyroidism of renal origin: Secondary | ICD-10-CM | POA: Diagnosis present

## 2020-12-06 DIAGNOSIS — Z7982 Long term (current) use of aspirin: Secondary | ICD-10-CM | POA: Diagnosis not present

## 2020-12-06 DIAGNOSIS — Z79899 Other long term (current) drug therapy: Secondary | ICD-10-CM | POA: Diagnosis not present

## 2020-12-06 DIAGNOSIS — E1165 Type 2 diabetes mellitus with hyperglycemia: Secondary | ICD-10-CM | POA: Diagnosis present

## 2020-12-06 DIAGNOSIS — N186 End stage renal disease: Secondary | ICD-10-CM | POA: Diagnosis present

## 2020-12-06 DIAGNOSIS — Z992 Dependence on renal dialysis: Secondary | ICD-10-CM | POA: Diagnosis not present

## 2020-12-06 DIAGNOSIS — E871 Hypo-osmolality and hyponatremia: Secondary | ICD-10-CM | POA: Diagnosis present

## 2020-12-06 DIAGNOSIS — Z833 Family history of diabetes mellitus: Secondary | ICD-10-CM | POA: Diagnosis not present

## 2020-12-06 DIAGNOSIS — K529 Noninfective gastroenteritis and colitis, unspecified: Secondary | ICD-10-CM | POA: Diagnosis present

## 2020-12-06 DIAGNOSIS — I120 Hypertensive chronic kidney disease with stage 5 chronic kidney disease or end stage renal disease: Secondary | ICD-10-CM | POA: Diagnosis present

## 2020-12-06 DIAGNOSIS — Z7984 Long term (current) use of oral hypoglycemic drugs: Secondary | ICD-10-CM | POA: Diagnosis not present

## 2020-12-06 DIAGNOSIS — K219 Gastro-esophageal reflux disease without esophagitis: Secondary | ICD-10-CM | POA: Diagnosis present

## 2020-12-06 DIAGNOSIS — E1122 Type 2 diabetes mellitus with diabetic chronic kidney disease: Secondary | ICD-10-CM | POA: Diagnosis present

## 2020-12-06 DIAGNOSIS — F32A Depression, unspecified: Secondary | ICD-10-CM | POA: Diagnosis present

## 2020-12-06 DIAGNOSIS — A419 Sepsis, unspecified organism: Secondary | ICD-10-CM | POA: Diagnosis present

## 2020-12-06 DIAGNOSIS — D631 Anemia in chronic kidney disease: Secondary | ICD-10-CM | POA: Diagnosis present

## 2020-12-06 DIAGNOSIS — Z720 Tobacco use: Secondary | ICD-10-CM | POA: Diagnosis not present

## 2020-12-06 DIAGNOSIS — A09 Infectious gastroenteritis and colitis, unspecified: Secondary | ICD-10-CM | POA: Diagnosis present

## 2020-12-06 DIAGNOSIS — Z20822 Contact with and (suspected) exposure to covid-19: Secondary | ICD-10-CM | POA: Diagnosis present

## 2020-12-06 LAB — BASIC METABOLIC PANEL
Anion gap: 19 — ABNORMAL HIGH (ref 5–15)
BUN: 41 mg/dL — ABNORMAL HIGH (ref 8–23)
CO2: 19 mmol/L — ABNORMAL LOW (ref 22–32)
Calcium: 7 mg/dL — ABNORMAL LOW (ref 8.9–10.3)
Chloride: 91 mmol/L — ABNORMAL LOW (ref 98–111)
Creatinine, Ser: 9.07 mg/dL — ABNORMAL HIGH (ref 0.44–1.00)
GFR, Estimated: 4 mL/min — ABNORMAL LOW (ref >60)
Glucose, Bld: 105 mg/dL — ABNORMAL HIGH (ref 70–99)
Potassium: 3.6 mmol/L (ref 3.5–5.1)
Sodium: 129 mmol/L — ABNORMAL LOW (ref 135–145)

## 2020-12-06 LAB — GLUCOSE, CAPILLARY
Glucose-Capillary: 150 mg/dL — ABNORMAL HIGH (ref 70–99)
Glucose-Capillary: 239 mg/dL — ABNORMAL HIGH (ref 70–99)
Glucose-Capillary: 197 mg/dL — ABNORMAL HIGH (ref 70–99)
Glucose-Capillary: 218 mg/dL — ABNORMAL HIGH (ref 70–99)

## 2020-12-06 LAB — CBC
HCT: 37.2 % (ref 36.0–46.0)
Hemoglobin: 11.4 g/dL — ABNORMAL LOW (ref 12.0–15.0)
MCH: 29.4 pg (ref 26.0–34.0)
MCHC: 30.6 g/dL (ref 30.0–36.0)
MCV: 95.9 fL (ref 80.0–100.0)
Platelets: 259 10*3/uL (ref 150–400)
RBC: 3.88 MIL/uL (ref 3.87–5.11)
RDW: 18.6 % — ABNORMAL HIGH (ref 11.5–15.5)
WBC: 10 10*3/uL (ref 4.0–10.5)
nRBC: 0.2 % (ref 0.0–0.2)

## 2020-12-06 MED ORDER — DELFLEX-LC/1.5% DEXTROSE 344 MOSM/L IP SOLN
INTRAPERITONEAL | Status: DC
  Administered 2020-12-07: 7200 mL via INTRAPERITONEAL
  Filled 2020-12-06 (×3): qty 3000

## 2020-12-06 MED ORDER — GENTAMICIN SULFATE 0.1 % EX CREA
1.0000 "application " | TOPICAL_CREAM | Freq: Every day | CUTANEOUS | Status: DC
  Administered 2020-12-07: 1 via TOPICAL
  Filled 2020-12-06: qty 15

## 2020-12-06 NOTE — Progress Notes (Signed)
[place] [facility] [place] NAME: Anita Wells    MR#:  [id number]  DATE OF BIRTH:  [birth year]  SUBJECTIVE:  came in with abdominal pain and nausea. Feels bit better today. Tolerating PO diet. Found to have focal colitis. Husband in the room. No fever nausea vomiting.  Tolerating PO diet.  REVIEW OF SYSTEMS:   Review of Systems  Constitutional: Negative for chills, fever and weight loss.  HENT: Negative for ear discharge, ear pain and nosebleeds.   Eyes: Negative for blurred vision, pain and discharge.  Respiratory: Negative for sputum production, shortness of breath, wheezing and stridor.   Cardiovascular: Negative for chest pain, palpitations, orthopnea and PND.  Gastrointestinal: Negative for abdominal pain, diarrhea, nausea and vomiting.  Genitourinary: Negative for frequency and urgency.  Musculoskeletal: Negative for back pain and joint pain.  Neurological: Negative for sensory change, speech change, focal weakness and weakness.  Psychiatric/Behavioral: Negative for depression and hallucinations. The patient is not nervous/anxious.    Tolerating Diet:yes Tolerating PT: ambulates by herself. No recent falls  DRUG ALLERGIES:   Allergies  Allergen Reactions  . Clopidogrel Swelling    Mouth swelling  . Ace Inhibitors Cough  . Amlodipine Other (See Comments)    Peripheral edema    VITALS:  Blood pressure (!) 86/56, pulse 82, temperature 98.7 F (37.1 C), temperature source Oral, resp. rate 17, height '5\' 7"'$  (1.702 m), weight 54.2 kg, SpO2 98 %.  PHYSICAL EXAMINATION:   Physical Exam  GENERAL:  80 y.o.-year-old patient lying in the bed with no acute distress. Thin frail LUNGS: Normal breath sounds bilaterally, no wheezing, rales, rhonchi. No use of accessory muscles of respiration.  CARDIOVASCULAR: S1, S2 normal. No murmurs, rubs, or gallops.  ABDOMEN: Soft, nontender, nondistended. Bowel sounds present.EXTREMITIES: No cyanosis,  clubbing or edema b/l.    NEUROLOGIC: Cranial nerves II through XII are intact. No focal Motor or sensory deficits b/l.   PSYCHIATRIC:  patient is alert and oriented x 3.  SKIN: No obvious rash, lesion, or ulcer.   LABORATORY PANEL:  CBC Recent Labs  Lab 12/06/20 0419  WBC 10.0  HGB 11.4*  HCT 37.2  PLT 259    Chemistries  Recent Labs  Lab 12/05/20 0818 12/05/20 1130 12/06/20 0419  NA 129*   < > 129*  K 3.2*   < > 3.6  CL 86*   < > 91*  CO2 22   < > 19*  GLUCOSE 167*   < > 105*  BUN 35*   < > 41*  CREATININE 8.74*   < > 9.07*  CALCIUM 7.3*   < > 7.0*  MG 1.5*  --   --   AST 31  --   --   ALT 20  --   --   ALKPHOS 135*  --   --   BILITOT 0.5  --   --    < > = values in this interval not displayed.   Cardiac Enzymes No results for input(s): TROPONINI in the last 168 hours. RADIOLOGY:  CT ABDOMEN PELVIS WO CONTRAST  Result Date: 12/05/2020 CLINICAL DATA:  Left lower quadrant pain and diarrhea EXAM: CT ABDOMEN AND PELVIS WITHOUT CONTRAST TECHNIQUE: Multidetector CT imaging of the abdomen and pelvis was performed following the standard protocol without IV contrast. COMPARISON:  10/17/2008 FINDINGS: Lower chest: Lung bases show no acute abnormality. Emphysematous changes are seen. Hepatobiliary: Gallbladder has been surgically removed. Liver is within normal limits. Pancreas: Unremarkable.  No pancreatic ductal dilatation or surrounding inflammatory changes. Spleen: Normal in size without focal abnormality. Adrenals/Urinary Tract: Adrenal glands are unremarkable. Kidneys demonstrate bilateral cystic change as well as nonobstructing calculi bilaterally. These lie predominately in the lower pole measuring approximately 4 mm bilaterally. No obstructive changes are seen. The bladder is decompressed. Stomach/Bowel: Postsurgical changes are noted in the rectosigmoid region. No obstructive changes are seen. Mild wall thickening is noted within the distal transverse and proximal descending  colon likely representing some focal colitis. The more proximal colon is within normal limits. The appendix is unremarkable. No small bowel abnormality is seen. The stomach is decompressed. Vascular/Lymphatic: Atherosclerotic changes of the abdominal aorta are noted. Mild prominence of the distal descending thoracic aorta is noted at 3 cm with changes of prior chronic dissection. Similar findings are noted in the distal infrarenal aorta measuring up to 2.5 cm. No aneurysmal dilatation is seen. No sizable adenopathy is noted. Reproductive: Uterus and bilateral adnexa are unremarkable. Other: Free fluid is noted within the abdomen and pelvis consistent with the known peritoneal dialysis catheter. Catheter is noted deep in the pelvis. No other focal abnormality is noted. Musculoskeletal: Degenerative changes of lumbar spine are seen. No acute bony abnormality is noted. IMPRESSION: Mild thickening in the distal transverse and proximal descending colon consistent with focal colitis. No abscess or perforation is noted. Considerable free fluid within the abdomen consistent with peritoneal dialysis. Bilateral nonobstructing renal calculi. Diffuse chronic atherosclerotic changes in the distal descending thoracic aorta and infrarenal abdominal aorta without aneurysmal dilatation. Electronically Signed   By: Inez Catalina M.D.   On: 12/05/2020 09:49   ASSESSMENT AND PLAN:  SANAY MAHIN is a 80 y.o. female with medical history significant of ESRD-peritoneal dialysis nightly, hypertension, hyperlipidemia, diabetes mellitus, GERD, depression, renal artery stenosis, anemia, bilateral carotid artery stenosis, diverticulitis, tobacco abuse, who presents with nausea, vomiting, diarrhea and abdominal pain. pt had peritoneal fluid culture done by Dr. Holley Raring in dialysis center 12/01/2020, NGTD.  Sepsis due to acute colitis: CT scan showed mild thickening in the distal transverse and proximal descending colon consistent with focal  colitis. No abscess or perforation is noted. Pt meets criteria for sepsis with leukocytosis with WBC 13.6, tachycardia with heart rate of 108.   -- lactic acid 1.4.  Blood pressures are soft.  Currently hemodynamically stable -IV Zosyn -Follow-up blood culture negative -C. difficile PCR antigen Positive, toxin negative and GI pathogen panel negative -As needed morphine and Percocet for pain -- PO diet. Not complaining of much abdominal pain today. She feels better than yesterday.  Essential hypertension: Blood pressure soft -IV hydralazine as needed -Hold home blood pressure medications  Hyperlipidemia -Lipitor  Hypokalemia and hypomagnesemia: Potassium 3.2 and magnesium 1.5.  on admission. - Repleted both  Hyponatremia: Sodium 129, most likely due to nausea vomiting diarrhea -Patient received 500 cc normal saline in ED -Check BMP every 8 hour  ESRD on peritoneal dialysis [facility]) -Dr. Holley Raring of renal is consulted for Peritoneal dialysis  Type II diabetes mellitus, uncontrolled with renal manifestations ([place]):  --Recent A1c 13.0, poorly controlled.  Patient is taking glipizide at home -Sliding scale insulin  GERD (gastroesophageal reflux disease) -Protonix  Depression -Lexapro, Remeron  Tobacco abuse -Nicotine patch  Anemia in ESRD (end-stage renal disease) ([place]): Hemoglobin stable   Nutrition Status: Nutrition Problem: Increased nutrient needs Etiology: chronic illness (ESRD on PD) Signs/Symptoms: estimated needs Interventions: Boost Breeze,MVI,Prostat,Magic cup     DVT ppx: SQ Heparin    Code Status: Full code  Family Communication:   Yes, patient's   husband at bed side Disposition Plan:  Anticipate discharge back to previous environment Consults called:  Nephrology Admission status and Level of care: Med-Surg:     Level of care: Med-Surg Status is: Inpatient  Patient continues to show improvement will discharged home tomorrow and complete oral  antibiotic as outpatient. Patient is in agreement with plan       TOTAL TIME TAKING CARE OF THIS PATIENT: *25 minutes.  >50% time spent on counselling and coordination of care  Note: This dictation was prepared with Dragon dictation along with smaller phrase technology. Any transcriptional errors that result from this process are unintentional.  Fritzi Mandes M.D    [facility]   CC: Primary care physician; Dion Body, MDPatient ID: Jacqulynn Cadet, female   DOB: [birth year], 80 y.o.   MRN: [id number]

## 2020-12-06 NOTE — Progress Notes (Signed)
[facility]  ROUNDING NOTE   Subjective:   Patient reports she has not had any vomiting today and that her diarrhea is improving. She has some of a chicken biscuit from [employer] this morning and is tolerating it well.  Denies any shortness of breath, edema but admits to left lower quadrant pain.  She did not received a PD treatment last night.   Objective:  Vital signs in last 24 hours:  Temp:  [97.6 F (36.4 C)-98.7 F (37.1 C)] 98.7 F (37.1 C) (02/12 1154) Pulse Rate:  [79-87] 82 (02/12 1154) Resp:  [16-17] 17 (02/12 1154) BP: (86-137)/(55-73) 86/56 (02/12 1154) SpO2:  [98 %-100 %] 98 % (02/12 1154) Weight:  [54.2 kg] 54.2 kg (02/12 0500)  Weight change:  Filed Weights   12/05/20 0809 12/06/20 0500  Weight: 51.5 kg 54.2 kg    Intake/Output: I/O last 3 completed shifts: In: 710.8 [P.O.:120; IV Piggyback:590.8] Out: 1 [Stool:1]   Intake/Output this shift:  No intake/output data recorded.  Physical Exam: General: NAD,   Head: Normocephalic, atraumatic. Moist oral mucosal membranes  Eyes: Anicteric, PERRL  Neck: Supple, trachea midline  Lungs:  Clear to auscultation  Heart: Regular rate and rhythm  Abdomen:  Soft, nontender,   Extremities:  No peripheral edema.  Neurologic: Nonfocal, moving all four extremities  Skin: No lesions  Access: PD catheter in place     Basic Metabolic Panel: Recent Labs  Lab 12/05/20 0818 12/05/20 1130 12/05/20 1901 12/06/20 0419  NA 129* 129* 129* 129*  K 3.2* 3.5 3.2* 3.6  CL 86* 92* 92* 91*  CO2 22 19* 22 19*  GLUCOSE 167* 126* 148* 105*  BUN 35* 36* 39* 41*  CREATININE 8.74* 8.19* 8.48* 9.07*  CALCIUM 7.3* 6.3* 6.9* 7.0*  MG 1.5*  --   --   --     Liver Function Tests: Recent Labs  Lab 12/05/20 0818  AST 31  ALT 20  ALKPHOS 135*  BILITOT 0.5  PROT 6.0*  ALBUMIN 2.2*   Recent Labs  Lab 12/05/20 0818  LIPASE 56*   No results for input(s): AMMONIA in the last 168 hours.  CBC: Recent Labs   Lab 12/05/20 0818 12/06/20 0419  WBC 13.6* 10.0  HGB 13.5 11.4*  HCT 42.4 37.2  MCV 92.8 95.9  PLT 301 259    Cardiac Enzymes: Recent Labs  Lab 12/05/20 0818  CKTOTAL 43    BNP: Invalid input(s): POCBNP  CBG: Recent Labs  Lab 12/05/20 1341 12/05/20 1645 12/05/20 2116 12/06/20 0759 12/06/20 1154  GLUCAP 117* 96 91 150* 239*    Microbiology: Results for orders placed or performed during the hospital encounter of 12/05/20  C Difficile Quick Screen w PCR reflex     Status: Abnormal   Collection Time: 12/05/20  9:28 AM   Specimen: Stool  Result Value Ref Range Status   C Diff antigen POSITIVE (A) NEGATIVE Final   C Diff toxin NEGATIVE NEGATIVE Final   C Diff interpretation Results are indeterminate. See PCR results.  Final    Comment: Performed at [facility], [address] [place], [place] [address]  C. Diff by PCR, Reflexed     Status: None   Collection Time: 12/05/20  9:28 AM  Result Value Ref Range Status   Toxigenic C. Difficile by PCR NEGATIVE NEGATIVE Final    Comment: Patient is colonized with non toxigenic C. difficile. May not need treatment unless significant symptoms are present. Performed at [facility], [phone] [place], [place] [address]   SARS CORONAVIRUS 2 (TAT 6-24 HRS) Nasopharyngeal Nasopharyngeal Swab     Status: None   Collection Time: 12/05/20 10:40 AM   Specimen: Nasopharyngeal Swab  Result Value Ref Range Status   SARS Coronavirus 2 NEGATIVE NEGATIVE Final    Comment: (NOTE) SARS-CoV-2 target nucleic acids are NOT DETECTED.  The SARS-CoV-2 RNA is generally detectable in upper and lower respiratory specimens during the acute phase of infection. Negative results do not preclude SARS-CoV-2 infection, do not rule out co-infections with other pathogens, and should not be used as the sole basis for treatment or other patient management decisions. Negative results must be combined with clinical  observations, patient history, and epidemiological information. The expected result is Negative.  Fact Sheet for Patients: SugarRoll.be  Fact Sheet for Healthcare Providers: https://www.woods-mathews.com/  This test is not yet approved or cleared by the [place] FDA and  has been authorized for detection and/or diagnosis of SARS-CoV-2 by FDA under an Emergency Use Authorization (EUA). This EUA will remain  in effect (meaning this test can be used) for the duration of the COVID-19 declaration under Se ction 564(b)(1) of the Act, 21 U.S.C. section 360bbb-3(b)(1), unless the authorization is terminated or revoked sooner.  Performed at [facility], [place] [address] [place], [place] [address]   Culture, blood (Routine X 2) w Reflex to ID Panel     Status: None (Preliminary result)   Collection Time: 12/05/20 11:30 AM   Specimen: BLOOD  Result Value Ref Range Status   Specimen Description BLOOD RIGHT ANTECUBITAL  Final   Special Requests   Final    BOTTLES DRAWN AEROBIC AND ANAEROBIC Blood Culture results may not be optimal due to an inadequate volume of blood received in culture bottles   Culture   Final    NO GROWTH < 24 HOURS Performed at [facility], [address], [place], [place] [address]    Report Status PENDING  Incomplete  Culture, blood (Routine X 2) w Reflex to ID Panel     Status: None (Preliminary result)   Collection Time: 12/05/20 11:30 AM   Specimen: BLOOD  Result Value Ref Range Status   Specimen Description BLOOD BLOOD RIGHT WRIST  Final   Special Requests   Final    BOTTLES DRAWN AEROBIC AND ANAEROBIC Blood Culture results may not be optimal due to an inadequate volume of blood received in culture bottles   Culture   Final    NO GROWTH < 24 HOURS Performed at [facility], [address] [place], [place] [address]    Report Status PENDING  Incomplete  Gastrointestinal Panel by PCR ,  Stool     Status: None   Collection Time: 12/05/20  4:40 PM   Specimen: Stool  Result Value Ref Range Status   Campylobacter species NOT DETECTED NOT DETECTED Final   Plesimonas shigelloides NOT DETECTED NOT DETECTED Final   Salmonella species NOT DETECTED NOT DETECTED Final   Yersinia enterocolitica NOT DETECTED NOT DETECTED Final   Vibrio species NOT DETECTED NOT DETECTED Final   Vibrio cholerae NOT DETECTED NOT DETECTED Final   Enteroaggregative E coli (EAEC) NOT DETECTED NOT DETECTED Final   Enteropathogenic E coli (EPEC) NOT DETECTED NOT DETECTED Final   Enterotoxigenic E coli (ETEC) NOT DETECTED NOT DETECTED Final   Shiga like toxin producing E coli (STEC) NOT DETECTED NOT DETECTED Final   Shigella/Enteroinvasive E coli (EIEC) NOT DETECTED NOT DETECTED Final  Cryptosporidium NOT DETECTED NOT DETECTED Final   Cyclospora cayetanensis NOT DETECTED NOT DETECTED Final   Entamoeba histolytica NOT DETECTED NOT DETECTED Final   Giardia lamblia NOT DETECTED NOT DETECTED Final   Adenovirus F40/41 NOT DETECTED NOT DETECTED Final   Astrovirus NOT DETECTED NOT DETECTED Final   Norovirus GI/GII NOT DETECTED NOT DETECTED Final   Rotavirus A NOT DETECTED NOT DETECTED Final   Sapovirus (I, II, IV, and V) NOT DETECTED NOT DETECTED Final    Comment: Performed at [facility], [address] [place], [place] [address]    Coagulation Studies: No results for input(s): LABPROT, INR in the last 72 hours.  Urinalysis: No results for input(s): COLORURINE, LABSPEC, PHURINE, GLUCOSEU, HGBUR, BILIRUBINUR, KETONESUR, PROTEINUR, UROBILINOGEN, NITRITE, LEUKOCYTESUR in the last 72 hours.  Invalid input(s): APPERANCEUR    Imaging: CT ABDOMEN PELVIS WO CONTRAST  Result Date: 12/05/2020 CLINICAL DATA:  Left lower quadrant pain and diarrhea EXAM: CT ABDOMEN AND PELVIS WITHOUT CONTRAST TECHNIQUE: Multidetector CT imaging of the abdomen and pelvis was performed following the standard protocol  without IV contrast. COMPARISON:  10/17/2008 FINDINGS: Lower chest: Lung bases show no acute abnormality. Emphysematous changes are seen. Hepatobiliary: Gallbladder has been surgically removed. Liver is within normal limits. Pancreas: Unremarkable. No pancreatic ductal dilatation or surrounding inflammatory changes. Spleen: Normal in size without focal abnormality. Adrenals/Urinary Tract: Adrenal glands are unremarkable. Kidneys demonstrate bilateral cystic change as well as nonobstructing calculi bilaterally. These lie predominately in the lower pole measuring approximately 4 mm bilaterally. No obstructive changes are seen. The bladder is decompressed. Stomach/Bowel: Postsurgical changes are noted in the rectosigmoid region. No obstructive changes are seen. Mild wall thickening is noted within the distal transverse and proximal descending colon likely representing some focal colitis. The more proximal colon is within normal limits. The appendix is unremarkable. No small bowel abnormality is seen. The stomach is decompressed. Vascular/Lymphatic: Atherosclerotic changes of the abdominal aorta are noted. Mild prominence of the distal descending thoracic aorta is noted at 3 cm with changes of prior chronic dissection. Similar findings are noted in the distal infrarenal aorta measuring up to 2.5 cm. No aneurysmal dilatation is seen. No sizable adenopathy is noted. Reproductive: Uterus and bilateral adnexa are unremarkable. Other: Free fluid is noted within the abdomen and pelvis consistent with the known peritoneal dialysis catheter. Catheter is noted deep in the pelvis. No other focal abnormality is noted. Musculoskeletal: Degenerative changes of lumbar spine are seen. No acute bony abnormality is noted. IMPRESSION: Mild thickening in the distal transverse and proximal descending colon consistent with focal colitis. No abscess or perforation is noted. Considerable free fluid within the abdomen consistent with  peritoneal dialysis. Bilateral nonobstructing renal calculi. Diffuse chronic atherosclerotic changes in the distal descending thoracic aorta and infrarenal abdominal aorta without aneurysmal dilatation. Electronically Signed   By: Inez Catalina M.D.   On: 12/05/2020 09:49     Medications:   . piperacillin-tazobactam (ZOSYN)  IV 100 mL/hr at 12/06/20 0617   . (feeding supplement) PROSource Plus  30 mL Oral BID BM  . aspirin  81 mg Oral Daily  . atorvastatin  40 mg Oral QHS  . calcium acetate  1,334 mg Oral TID WC  . escitalopram  10 mg Oral Daily  . feeding supplement  1 Container Oral BID WC  . heparin  5,000 Units Subcutaneous Q8H  . insulin aspart  0-5 Units Subcutaneous QHS  . insulin aspart  0-9 Units Subcutaneous TID WC  . mirtazapine  7.5 mg  Oral QHS  . mometasone-formoterol  2 puff Inhalation BID  . multivitamin  1 tablet Oral Daily  . nicotine  21 mg Transdermal Daily  . pantoprazole  40 mg Oral Daily  . umeclidinium bromide  1 puff Inhalation Daily   acetaminophen, albuterol, calcium acetate, hydrALAZINE, hydrocortisone cream, hydrOXYzine, hydrOXYzine, lip balm, morphine injection, oxyCODONE-acetaminophen  Assessment/ Plan:  Ms. RUBBIE LAPOINTE is a 80 y.o.  female  with past medical history of Barrett's esophagus, GERD, ESRD on PD, anemia chronic kidney disease, secondary hyperparathyroidism, hypertension, left renal artery stenosis, anemia of chronic kidney disease who presented to the ED with nausea, vomiting, diarrhea and abdominal pain on 12/05/20.   1.  ESRD on PD.  - plan for PD tonight.   2.  Anemia of chronic kidney disease.  Hemoglobin currently 11.4.   - on epo as an outpatient   3.  Secondary hyperparathyroidism.  - continue calcium acetate  4. Sepsis due to colitis  - on IV zosyn   5. Hypertension  - holding home bp meds due to hypotension    LOS: 0 [place] P Tedrow 2/12/202212:54 PM

## 2020-12-07 LAB — BASIC METABOLIC PANEL
Anion gap: 13 (ref 5–15)
BUN: 45 mg/dL — ABNORMAL HIGH (ref 8–23)
CO2: 24 mmol/L (ref 22–32)
Calcium: 6.6 mg/dL — ABNORMAL LOW (ref 8.9–10.3)
Chloride: 91 mmol/L — ABNORMAL LOW (ref 98–111)
Creatinine, Ser: 9.33 mg/dL — ABNORMAL HIGH (ref 0.44–1.00)
GFR, Estimated: 4 mL/min — ABNORMAL LOW (ref >60)
Glucose, Bld: 43 mg/dL — CL (ref 70–99)
Potassium: 3.4 mmol/L — ABNORMAL LOW (ref 3.5–5.1)
Sodium: 128 mmol/L — ABNORMAL LOW (ref 135–145)

## 2020-12-07 LAB — GLUCOSE, CAPILLARY
Glucose-Capillary: 353 mg/dL — ABNORMAL HIGH (ref 70–99)
Glucose-Capillary: 321 mg/dL — ABNORMAL HIGH (ref 70–99)
Glucose-Capillary: 137 mg/dL — ABNORMAL HIGH (ref 70–99)
Glucose-Capillary: 88 mg/dL (ref 70–99)
Glucose-Capillary: 213 mg/dL — ABNORMAL HIGH (ref 70–99)

## 2020-12-07 MED ORDER — CHLORHEXIDINE GLUCONATE CLOTH 2 % EX PADS
6.0000 | MEDICATED_PAD | Freq: Every day | CUTANEOUS | Status: DC
  Administered 2020-12-07: 6 via TOPICAL

## 2020-12-07 MED ORDER — DEXTROSE 50 % IV SOLN
INTRAVENOUS | Status: AC
  Filled 2020-12-07: qty 50

## 2020-12-07 MED ORDER — GLIPIZIDE 5 MG PO TABS
10.0000 mg | ORAL_TABLET | Freq: Every day | ORAL | Status: DC
  Filled 2020-12-07: qty 2

## 2020-12-07 MED ORDER — AMOXICILLIN-POT CLAVULANATE 500-125 MG PO TABS
1.0000 | ORAL_TABLET | Freq: Every day | ORAL | Status: DC
  Administered 2020-12-08: 500 mg via ORAL
  Filled 2020-12-07: qty 1

## 2020-12-07 MED ORDER — AMOXICILLIN-POT CLAVULANATE 875-125 MG PO TABS
1.0000 | ORAL_TABLET | Freq: Two times a day (BID) | ORAL | Status: DC
  Administered 2020-12-07: 1 via ORAL
  Filled 2020-12-07: qty 1

## 2020-12-07 NOTE — Progress Notes (Signed)
[facility]  ROUNDING NOTE   Subjective:   Patient was sitting up in bed , getting ready to eat breakfast.  Reports that she is still having some lower abdominal pain.  Admitted to a little diarrhea yesterday. Denies any vomiting.  Had PD last night, denies any difficulties with treatment.   Objective:  Vital signs in last 24 hours:  Temp:  [97.8 F (36.6 C)-98.7 F (37.1 C)] 97.9 F (36.6 C) (02/13 0740) Pulse Rate:  [77-95] 95 (02/13 0740) Resp:  [16-18] 18 (02/13 0740) BP: (79-141)/(42-80) 141/80 (02/13 0740) SpO2:  [96 %-100 %] 96 % (02/13 0740) Weight:  [49.8 kg] 49.8 kg (02/13 0620)  Weight change: -1.74 kg Filed Weights   12/05/20 0809 12/06/20 0500 12/07/20 0620  Weight: 51.5 kg 54.2 kg 49.8 kg    Intake/Output: I/O last 3 completed shifts: In: 210.8 [P.O.:120; IV Piggyback:90.8] Out: 1 [Stool:1]   Intake/Output this shift:  No intake/output data recorded.  Physical Exam: General: NAD, siting up in bed   Head: Normocephalic, atraumatic. Moist oral mucosal membranes  Eyes: Anicteric, PERRL  Neck: Supple, trachea midline  Lungs:  Clear to auscultation  Heart: Regular rate and rhythm  Abdomen:  Soft, tenderness over left lower quadrant    Extremities:  No peripheral edema.  Neurologic: Nonfocal, moving all four extremities  Skin: No lesions  Access: PD catheter    Basic Metabolic Panel: Recent Labs  Lab 12/05/20 0818 12/05/20 1130 12/05/20 1901 12/06/20 0419  NA 129* 129* 129* 129*  K 3.2* 3.5 3.2* 3.6  CL 86* 92* 92* 91*  CO2 22 19* 22 19*  GLUCOSE 167* 126* 148* 105*  BUN 35* 36* 39* 41*  CREATININE 8.74* 8.19* 8.48* 9.07*  CALCIUM 7.3* 6.3* 6.9* 7.0*  MG 1.5*  --   --   --     Liver Function Tests: Recent Labs  Lab 12/05/20 0818  AST 31  ALT 20  ALKPHOS 135*  BILITOT 0.5  PROT 6.0*  ALBUMIN 2.2*   Recent Labs  Lab 12/05/20 0818  LIPASE 56*   No results for input(s): AMMONIA in the last 168 hours.  CBC: Recent  Labs  Lab 12/05/20 0818 12/06/20 0419  WBC 13.6* 10.0  HGB 13.5 11.4*  HCT 42.4 37.2  MCV 92.8 95.9  PLT 301 259    Cardiac Enzymes: Recent Labs  Lab 12/05/20 0818  CKTOTAL 43    BNP: Invalid input(s): POCBNP  CBG: Recent Labs  Lab 12/06/20 0759 12/06/20 1154 12/06/20 1634 12/06/20 2216 12/07/20 0742  GLUCAP 150* 239* 197* 218* 353*    Microbiology: Results for orders placed or performed during the hospital encounter of 12/05/20  C Difficile Quick Screen w PCR reflex     Status: Abnormal   Collection Time: 12/05/20  9:28 AM   Specimen: Stool  Result Value Ref Range Status   C Diff antigen POSITIVE (A) NEGATIVE Final   C Diff toxin NEGATIVE NEGATIVE Final   C Diff interpretation Results are indeterminate. See PCR results.  Final    Comment: Performed at [facility], [address] [place], [place] [address]  C. Diff by PCR, Reflexed     Status: None   Collection Time: 12/05/20  9:28 AM  Result Value Ref Range Status   Toxigenic C. Difficile by PCR NEGATIVE NEGATIVE Final    Comment: Patient is colonized with non toxigenic C. difficile. May not need treatment unless significant symptoms are present. Performed at [facility], [address] [place], [place] [address]   SARS CORONAVIRUS 2 (TAT 6-24 HRS) Nasopharyngeal Nasopharyngeal Swab     Status: None   Collection Time: 12/05/20 10:40 AM   Specimen: Nasopharyngeal Swab  Result Value Ref Range Status   SARS Coronavirus 2 NEGATIVE NEGATIVE Final    Comment: (NOTE) SARS-CoV-2 target nucleic acids are NOT DETECTED.  The SARS-CoV-2 RNA is generally detectable in upper and lower respiratory specimens during the acute phase of infection. Negative results do not preclude SARS-CoV-2 infection, do not rule out co-infections with other pathogens, and should not be used as the sole basis for treatment or other patient management decisions. Negative results must be combined with clinical  observations, patient history, and epidemiological information. The expected result is Negative.  Fact Sheet for Patients: SugarRoll.be  Fact Sheet for Healthcare Providers: https://www.woods-mathews.com/  This test is not yet approved or cleared by the [place] FDA and  has been authorized for detection and/or diagnosis of SARS-CoV-2 by FDA under an Emergency Use Authorization (EUA). This EUA will remain  in effect (meaning this test can be used) for the duration of the COVID-19 declaration under Se ction 564(b)(1) of the Act, 21 U.S.C. section 360bbb-3(b)(1), unless the authorization is terminated or revoked sooner.  Performed at [facility], [place] [address], [place], [place] [address]   Culture, blood (Routine X 2) w Reflex to ID Panel     Status: None (Preliminary result)   Collection Time: 12/05/20 11:30 AM   Specimen: BLOOD  Result Value Ref Range Status   Specimen Description BLOOD RIGHT ANTECUBITAL  Final   Special Requests   Final    BOTTLES DRAWN AEROBIC AND ANAEROBIC Blood Culture results may not be optimal due to an inadequate volume of blood received in culture bottles   Culture   Final    NO GROWTH 2 DAYS Performed at [facility], [address], [place], [place] [address]    Report Status PENDING  Incomplete  Culture, blood (Routine X 2) w Reflex to ID Panel     Status: None (Preliminary result)   Collection Time: 12/05/20 11:30 AM   Specimen: BLOOD  Result Value Ref Range Status   Specimen Description BLOOD BLOOD RIGHT WRIST  Final   Special Requests   Final    BOTTLES DRAWN AEROBIC AND ANAEROBIC Blood Culture results may not be optimal due to an inadequate volume of blood received in culture bottles   Culture   Final    NO GROWTH 2 DAYS Performed at [facility], [address] [place], [place] [address]    Report Status PENDING  Incomplete  Gastrointestinal Panel by PCR , Stool      Status: None   Collection Time: 12/05/20  4:40 PM   Specimen: Stool  Result Value Ref Range Status   Campylobacter species NOT DETECTED NOT DETECTED Final   Plesimonas shigelloides NOT DETECTED NOT DETECTED Final   Salmonella species NOT DETECTED NOT DETECTED Final   Yersinia enterocolitica NOT DETECTED NOT DETECTED Final   Vibrio species NOT DETECTED NOT DETECTED Final   Vibrio cholerae NOT DETECTED NOT DETECTED Final   Enteroaggregative E coli (EAEC) NOT DETECTED NOT DETECTED Final   Enteropathogenic E coli (EPEC) NOT DETECTED NOT DETECTED Final   Enterotoxigenic E coli (ETEC) NOT DETECTED NOT DETECTED Final   Shiga like toxin producing E coli (STEC) NOT DETECTED NOT DETECTED Final   Shigella/Enteroinvasive E coli (EIEC) NOT DETECTED NOT DETECTED Final   Cryptosporidium NOT  DETECTED NOT DETECTED Final   Cyclospora cayetanensis NOT DETECTED NOT DETECTED Final   Entamoeba histolytica NOT DETECTED NOT DETECTED Final   Giardia lamblia NOT DETECTED NOT DETECTED Final   Adenovirus F40/41 NOT DETECTED NOT DETECTED Final   Astrovirus NOT DETECTED NOT DETECTED Final   Norovirus GI/GII NOT DETECTED NOT DETECTED Final   Rotavirus A NOT DETECTED NOT DETECTED Final   Sapovirus (I, II, IV, and V) NOT DETECTED NOT DETECTED Final    Comment: Performed at [facility], [address] [place], [place] [address]    Coagulation Studies: No results for input(s): LABPROT, INR in the last 72 hours.  Urinalysis: No results for input(s): COLORURINE, LABSPEC, PHURINE, GLUCOSEU, HGBUR, BILIRUBINUR, KETONESUR, PROTEINUR, UROBILINOGEN, NITRITE, LEUKOCYTESUR in the last 72 hours.  Invalid input(s): APPERANCEUR    Imaging: CT ABDOMEN PELVIS WO CONTRAST  Result Date: 12/05/2020 CLINICAL DATA:  Left lower quadrant pain and diarrhea EXAM: CT ABDOMEN AND PELVIS WITHOUT CONTRAST TECHNIQUE: Multidetector CT imaging of the abdomen and pelvis was performed following the standard protocol without IV  contrast. COMPARISON:  10/17/2008 FINDINGS: Lower chest: Lung bases show no acute abnormality. Emphysematous changes are seen. Hepatobiliary: Gallbladder has been surgically removed. Liver is within normal limits. Pancreas: Unremarkable. No pancreatic ductal dilatation or surrounding inflammatory changes. Spleen: Normal in size without focal abnormality. Adrenals/Urinary Tract: Adrenal glands are unremarkable. Kidneys demonstrate bilateral cystic change as well as nonobstructing calculi bilaterally. These lie predominately in the lower pole measuring approximately 4 mm bilaterally. No obstructive changes are seen. The bladder is decompressed. Stomach/Bowel: Postsurgical changes are noted in the rectosigmoid region. No obstructive changes are seen. Mild wall thickening is noted within the distal transverse and proximal descending colon likely representing some focal colitis. The more proximal colon is within normal limits. The appendix is unremarkable. No small bowel abnormality is seen. The stomach is decompressed. Vascular/Lymphatic: Atherosclerotic changes of the abdominal aorta are noted. Mild prominence of the distal descending thoracic aorta is noted at 3 cm with changes of prior chronic dissection. Similar findings are noted in the distal infrarenal aorta measuring up to 2.5 cm. No aneurysmal dilatation is seen. No sizable adenopathy is noted. Reproductive: Uterus and bilateral adnexa are unremarkable. Other: Free fluid is noted within the abdomen and pelvis consistent with the known peritoneal dialysis catheter. Catheter is noted deep in the pelvis. No other focal abnormality is noted. Musculoskeletal: Degenerative changes of lumbar spine are seen. No acute bony abnormality is noted. IMPRESSION: Mild thickening in the distal transverse and proximal descending colon consistent with focal colitis. No abscess or perforation is noted. Considerable free fluid within the abdomen consistent with peritoneal dialysis.  Bilateral nonobstructing renal calculi. Diffuse chronic atherosclerotic changes in the distal descending thoracic aorta and infrarenal abdominal aorta without aneurysmal dilatation. Electronically Signed   By: Inez Catalina M.D.   On: 12/05/2020 09:49     Medications:   . dialysis solution 1.5% low-MG/low-CA     . (feeding supplement) PROSource Plus  30 mL Oral BID BM  . amoxicillin-clavulanate  1 tablet Oral Q12H  . aspirin  81 mg Oral Daily  . atorvastatin  40 mg Oral QHS  . calcium acetate  1,334 mg Oral TID WC  . escitalopram  10 mg Oral Daily  . feeding supplement  1 Container Oral BID WC  . gentamicin cream  1 application Topical Daily  . [START ON 12/08/2020] glipiZIDE  10 mg Oral QAC breakfast  . heparin  5,000 Units Subcutaneous Q8H  .  insulin aspart  0-5 Units Subcutaneous QHS  . insulin aspart  0-9 Units Subcutaneous TID WC  . mirtazapine  7.5 mg Oral QHS  . mometasone-formoterol  2 puff Inhalation BID  . multivitamin  1 tablet Oral Daily  . nicotine  21 mg Transdermal Daily  . pantoprazole  40 mg Oral Daily  . umeclidinium bromide  1 puff Inhalation Daily   acetaminophen, albuterol, calcium acetate, hydrocortisone cream, hydrOXYzine, lip balm, oxyCODONE-acetaminophen  Assessment/ Plan:  [name] is a 80 y.o.  female with past medical history of Barrett's esophagus, GERD, ESRD on PD, anemia chronic kidney disease, secondary hyperparathyroidism, hypertension, left renal artery stenosis, anemia of chronic kidney disease who presented to the ED with nausea, vomiting, diarrhea and abdominal pain on 12/05/20.   1. ESRD on PD.  - Patient had PD last night without any issues.   2. Anemia of chronic kidney disease. Hemoglobin currently 11.4.  - on epo as an outpatient   3. Secondary hyperparathyroidism.  - continue calcium acetate   4. Sepsis due to colitis  - no longer on IV Zosyn    5. Hypertension  - holding home bp meds due to hypotension      LOS: [place] 2/13/20229:19 AM

## 2020-12-07 NOTE — Progress Notes (Signed)
PHARMACY NOTE:  ANTIMICROBIAL RENAL DOSAGE ADJUSTMENT  Current antimicrobial regimen includes a mismatch between antimicrobial dosage and estimated renal function.  As per policy approved by the Pharmacy & Therapeutics and Medical Executive Committees, the antimicrobial dosage will be adjusted accordingly.  Current antimicrobial dosage:  Augmentin 875 mg po BID  Indication: colitis  Renal Function:  Estimated Creatinine Clearance: 3.7 mL/min (A) (by C-G formula based on SCr of 9.33 mg/dL (H)). '[x]'$      On intermittent HD, scheduled: '[]'$      On CRRT    Antimicrobial dosage has been changed to:  Augmentin 500 mg po once daily   Thank you for allowing pharmacy to be a part of this patient's care.  Dallie Piles, [facility] 12/07/2020 10:52 AM

## 2020-12-07 NOTE — Progress Notes (Signed)
[facility] [place] NAME: Anita Wells    MR#:  [id number]  DATE OF BIRTH:  [birth year]  SUBJECTIVE:  came in with abdominal pain and nausea. Feels bit better today. Tolerating PO diet. Found to have focal colitis.  No fever nausea vomiting.  Tolerating PO diet. Patient's blood pressure and blood sugars have been fluctuating. She wishes to go home  REVIEW OF SYSTEMS:   Review of Systems  Constitutional: Negative for chills, fever and weight loss.  HENT: Negative for ear discharge, ear pain and nosebleeds.   Eyes: Negative for blurred vision, pain and discharge.  Respiratory: Negative for sputum production, shortness of breath, wheezing and stridor.   Cardiovascular: Negative for chest pain, palpitations, orthopnea and PND.  Gastrointestinal: Negative for abdominal pain, diarrhea, nausea and vomiting.  Genitourinary: Negative for frequency and urgency.  Musculoskeletal: Negative for back pain and joint pain.  Neurological: Negative for sensory change, speech change, focal weakness and weakness.  Psychiatric/Behavioral: Negative for depression and hallucinations. The patient is not nervous/anxious.    Tolerating Diet:yes Tolerating PT: ambulates by herself. No recent falls  DRUG ALLERGIES:   Allergies  Allergen Reactions  . Clopidogrel Swelling    Mouth swelling  . Ace Inhibitors Cough  . Amlodipine Other (See Comments)    Peripheral edema    VITALS:  Blood pressure (!) 118/58, pulse 82, temperature 98.1 F (36.7 C), resp. rate 16, height '5\' 7"'$  (1.702 m), weight 49.8 kg, SpO2 99 %.  PHYSICAL EXAMINATION:   Physical Exam  GENERAL:  80 y.o.-year-old patient lying in the bed with no acute distress. Thin frail LUNGS: Normal breath sounds bilaterally, no wheezing, rales, rhonchi. No use of accessory muscles of respiration.  CARDIOVASCULAR: S1, S2 normal. No murmurs, rubs, or gallops.  ABDOMEN: Soft, nontender, nondistended. Bowel  sounds present.EXTREMITIES: No cyanosis, clubbing or edema b/l.    NEUROLOGIC: Cranial nerves II through XII are intact. No focal Motor or sensory deficits b/l.   PSYCHIATRIC:  patient is alert and oriented x 3.  SKIN: No obvious rash, lesion, or ulcer.   LABORATORY PANEL:  CBC Recent Labs  Lab 12/06/20 0419  WBC 10.0  HGB 11.4*  HCT 37.2  PLT 259    Chemistries  Recent Labs  Lab 12/05/20 0818 12/05/20 1130 12/07/20 0915  NA 129*   < > 128*  K 3.2*   < > 3.4*  CL 86*   < > 91*  CO2 22   < > 24  GLUCOSE 167*   < > 43*  BUN 35*   < > 45*  CREATININE 8.74*   < > 9.33*  CALCIUM 7.3*   < > 6.6*  MG 1.5*  --   --   AST 31  --   --   ALT 20  --   --   ALKPHOS 135*  --   --   BILITOT 0.5  --   --    < > = values in this interval not displayed.   Cardiac Enzymes No results for input(s): TROPONINI in the last 168 hours. RADIOLOGY:  No results found. ASSESSMENT AND PLAN:  Anita Wells is a 80 y.o. female with medical history significant of ESRD-peritoneal dialysis nightly, hypertension, hyperlipidemia, diabetes mellitus, GERD, depression, renal artery stenosis, anemia, bilateral carotid artery stenosis, diverticulitis, tobacco abuse, who presents with nausea, vomiting, diarrhea and abdominal pain. pt had peritoneal fluid culture done by Dr. Holley Raring in dialysis center 12/01/2020, NGTD.  Sepsis due to acute colitis: CT scan showed mild thickening in the distal transverse and proximal descending colon consistent with focal colitis. No abscess or perforation is noted. Pt meets criteria for sepsis with leukocytosis with WBC 13.6, tachycardia with heart rate of 108.   -- lactic acid 1.4.  Blood pressures are soft.  -IV Zosyn--change to po augmentin -Follow-up blood culture negative -C. difficile PCR antigen Positive, toxin negative and GI pathogen panel negative -As needed morphine and Percocet for pain -- PO diet. Not complaining of much abdominal pain today. She feels better than  yesterday.  Essential hypertension: Blood pressure soft -IV hydralazine as needed -Hold home blood pressure medications  Hyperlipidemia -Lipitor  Hypokalemia and hypomagnesemia: Potassium 3.2 and magnesium 1.5.  on admission. - Repleted both  Hyponatremia: Sodium 129, most likely due to nausea vomiting diarrhea -Patient received 500 cc normal saline in ED -Check BMP every 8 hour  ESRD on peritoneal dialysis [facility]) -Dr. Holley Raring of renal is consulted for Peritoneal dialysis  Type II diabetes mellitus, uncontrolled with renal manifestations ([place]):  --Recent A1c 13.0, poorly controlled.  Patient is taking glipizide at home -Sliding scale insulin -- sugars have been fluctuating. Received one amp off dextrose with sugar of 43.  GERD (gastroesophageal reflux disease) -Protonix  Depression -Lexapro, Remeron  Tobacco abuse -Nicotine patch  Anemia in ESRD (end-stage renal disease) ([place]): Hemoglobin stable   Nutrition Status: Nutrition Problem: Increased nutrient needs Etiology: chronic illness (ESRD on PD) Signs/Symptoms: estimated needs Interventions: Boost Breeze,MVI,Prostat,Magic cup     DVT ppx: SQ Heparin    Code Status: Full code Family Communication:   Yes, patient's   husband at bed side Disposition Plan:  Anticipate discharge back to previous environment Consults called:  Nephrology Admission status and Level of care: Med-Surg:     Level of care: Med-Surg Status is: Inpatient  Patient's blood pressure and blood sugar have been fluctuating today. She wishes to go home however recommended her to stay one more day to ensure it is stable.     TOTAL TIME TAKING CARE OF THIS PATIENT: *25 minutes.  >50% time spent on counselling and coordination of care  Note: This dictation was prepared with Dragon dictation along with smaller phrase technology. Any transcriptional errors that result from this process are unintentional.  Fritzi Mandes M.D    [facility]   CC: Primary care physician; Dion Body, MDPatient ID: Anita Wells, female   DOB: 19-Apr-1941, 80 y.o.   MRN: [id number]

## 2020-12-07 NOTE — Progress Notes (Signed)
   12/07/20 0117  Assess: MEWS Score  Temp 98 F (36.7 C)  BP (!) 79/42  Pulse Rate 77  Resp 18  SpO2 96 %  O2 Device Room Air  Assess: MEWS Score  MEWS Temp 0  MEWS Systolic 2  MEWS Pulse 0  MEWS RR 0  MEWS LOC 0  MEWS Score 2  MEWS Score Color Yellow  Assess: if the MEWS score is Yellow or Red  Were vital signs taken at a resting state? Yes  Focused Assessment Change from prior assessment (see assessment flowsheet)  Early Detection of Sepsis Score *See Row Information* Low  MEWS guidelines implemented *See Row Information* Yes  Take Vital Signs  Increase Vital Sign Frequency  Yellow: Q 2hr X 2 then Q 4hr X 2, if remains yellow, continue Q 4hrs  Escalate  MEWS: Escalate Yellow: discuss with charge nurse/RN and consider discussing with provider and RRT  Notify: Charge Nurse/RN  Name of Charge Nurse/RN Notified Sonya RN  Date Charge Nurse/RN Notified 12/07/20  Time Charge Nurse/RN Notified 0117  Notify: Provider  Provider Name/Title Sharion Settler NP  Date Provider Notified 12/07/20  Time Provider Notified [phone]  Notification Type  (secure chat)  Notification Reason Change in status  Response Other (Comment) (NP morrison came to patients room to assess patient.)  Date of Provider Response 12/07/20  Time of Provider Response 0134  Document  Patient Outcome Stabilized after interventions  Progress note created (see row info) Yes   Patients BP rechecked BP now 93/50, NP brenda Randol Kern came to patients room and assessed patient. Patient A/O x4, patient states pain score of 0, on a numeric scale of 0-10, patient currently resting. Peritoneal dialysis still running. Will continue to monitor.

## 2020-12-08 ENCOUNTER — Ambulatory Visit: Payer: Medicare Other

## 2020-12-08 DIAGNOSIS — Z992 Dependence on renal dialysis: Secondary | ICD-10-CM

## 2020-12-08 DIAGNOSIS — D631 Anemia in chronic kidney disease: Secondary | ICD-10-CM

## 2020-12-08 DIAGNOSIS — Z720 Tobacco use: Secondary | ICD-10-CM

## 2020-12-08 DIAGNOSIS — K219 Gastro-esophageal reflux disease without esophagitis: Secondary | ICD-10-CM

## 2020-12-08 DIAGNOSIS — N186 End stage renal disease: Secondary | ICD-10-CM

## 2020-12-08 LAB — GLUCOSE, CAPILLARY
Glucose-Capillary: 99 mg/dL (ref 70–99)
Glucose-Capillary: 98 mg/dL (ref 70–99)

## 2020-12-08 MED ORDER — AMOXICILLIN-POT CLAVULANATE 500-125 MG PO TABS
1.0000 | ORAL_TABLET | Freq: Every day | ORAL | 0 refills | Status: DC
Start: 2020-12-09 — End: 2020-12-19

## 2020-12-08 MED ORDER — INSULIN GLARGINE 100 UNIT/ML ~~LOC~~ SOLN: 10.0000 [IU] | Freq: Every day | SUBCUTANEOUS | Status: DC

## 2020-12-08 MED ORDER — INSULIN GLARGINE 100 UNIT/ML ~~LOC~~ SOLN: 5.0000 [IU] | Freq: Every day | SUBCUTANEOUS | 11 refills | Status: DC

## 2020-12-08 MED ORDER — OXYCODONE-ACETAMINOPHEN 5-325 MG PO TABS: 1.0000 | ORAL_TABLET | Freq: Three times a day (TID) | ORAL | 0 refills | Status: AC | PRN

## 2020-12-08 NOTE — Discharge Summary (Addendum)
Anita Wells at [place] NAME: Anita Wells    MR#:  [id number]  DATE OF BIRTH:  06/18/1941  DATE OF ADMISSION:  12/05/2020 ADMITTING PHYSICIAN: Fritzi Mandes, MD  DATE OF DISCHARGE: 12/08/2020  PRIMARY CARE PHYSICIAN: Dion Body, MD    ADMISSION DIAGNOSIS:  Colitis [K52.9] Acute colitis [K52.9] Sepsis, due to unspecified organism, unspecified whether acute organ dysfunction present ([place]) [A41.9]  DISCHARGE DIAGNOSIS:  Sepsis on POA Acute Colitis SECONDARY DIAGNOSIS:   Past Medical History:  Diagnosis Date  . Anemia   . Barrett's esophagus   . Carotid stenosis, bilateral   . Chronic kidney disease   . GERD (gastroesophageal reflux disease)   . Hyperlipidemia   . Hyperparathyroidism ([place])   . Hypertension   . Renal artery stenosis [facility])    left    HOSPITAL COURSE:   Anita Wells a 80 y.o.femalewith medical history significant ofESRD-peritoneal dialysis nightly, hypertension, hyperlipidemia, diabetes mellitus, GERD, depression, renal artery stenosis, anemia, bilateral carotid artery stenosis, diverticulitis, tobacco abuse, who presents with nausea, vomiting, diarrhea and abdominal pain. pt hadperitoneal fluid culture done by Dr. Holley Raring in dialysis center 12/01/2020,NGTD.  Sepsis due to acute colitis: CT scan showed mild thickening in the distal transverse and proximal descending colon consistent with focal colitis. No abscess or perforation is noted.Ptmeets criteria for sepsis with leukocytosis with WBC 13.6, tachycardia with heart rate of 108.  -- lactic acid 1.4. Blood pressures are stable  -IV Zosyn--changed to po augmentin (renal dose) - blood culture negative -C. difficile PCR antigen Positive, toxin negative and GI pathogen panel negative -As needed morphine and Percocet for pain -- tolerating PO diet. Not complaining of much abdominal pain today. She feels better than yesterday. Some diarrhea  Essential  hypertension:Blood pressure soft -IV hydralazine as needed -amlodipine 2.5 mg qd  Hyperlipidemia -Lipitor  Hypokalemiaandhypomagnesemia:Potassium 3.2 and magnesium 1.5.on admission. - Repletedboth  Hyponatremia:Sodium 129, most likely due to nausea vomiting diarrhea -Patient received 500 cc normal saline in ED --Dr Juleen China aware  ESRD on peritoneal dialysis [facility]) -cont Peritoneal dialysis as before  Type II diabetes mellitus, uncontrolled with renal manifestations ([place]): --Recent A1c 13.0, poorly controlled. (feb 2022_ --reviewed out pt Dr Valda Favia' Jan 4th 2022 "She has had persistent fasting hyperglycemia. While Hb A1c is low at 5.3%, this may be due to the medications she receives to treat her anemia. Today she was counseled to restart Lantus insulin 10 units qhs. Aim for sugars in the 70 - 130 mg/dl range. She was asked to contact me if she has any concerns about hypoglycemia. Advised about hypoglycemia, what it is, signs/symptoms and appropriate treatment.   -Given labile sugars I have discussed with patient to start taking 5 units of Lantus insulin monitor sugars I discussed results with Dr. Peter Congo voiced understanding  GERD (gastroesophageal reflux disease) -Protonix  Depression -Lexapro, Remeron  Tobacco abuse -Nicotine patch  Anemia in ESRD (end-stage renal disease) ([place]): Hemoglobin stable   Nutrition Status: Nutrition Problem: Increased nutrient needs Etiology: chronic illness (ESRD on PD) Signs/Symptoms: estimated needs Interventions: Boost Breeze,MVI,Prostat,Magic cup  patient is optimized medically. She will discharge to home. She is in agreement. Okay with nephrology for discharge.   DVT ppx: SQ Heparin  Code Status:Full code Family Communication: none today Disposition Plan: Anticipate discharge back to previous environment Consults called:Nephrology Admission status and Level of care:Med-Surg:   Level of care:  Med-Surg Status is: Inpatient CONSULTS OBTAINED:    DRUG ALLERGIES:   Allergies  Allergen  Reactions  . Clopidogrel Swelling    Mouth swelling  . Ace Inhibitors Cough  . Amlodipine Other (See Comments)    Peripheral edema    DISCHARGE MEDICATIONS:   Allergies as of 12/08/2020      Reactions   Clopidogrel Swelling   Mouth swelling   Ace Inhibitors Cough   Amlodipine Other (See Comments)   Peripheral edema      Medication List    STOP taking these medications   blood glucose meter kit and supplies   ferric citrate 1 GM 210 MG(Fe) tablet Commonly known as: AURYXIA   furosemide 40 MG tablet Commonly known as: LASIX   glipiZIDE 10 MG tablet Commonly known as: GLUCOTROL   hydrALAZINE 100 MG tablet Commonly known as: APRESOLINE   metoprolol tartrate 100 MG tablet Commonly known as: LOPRESSOR   torsemide 100 MG tablet Commonly known as: DEMADEX     TAKE these medications   albuterol 108 (90 Base) MCG/ACT inhaler Commonly known as: VENTOLIN HFA Inhale into the lungs.   amLODipine 2.5 MG tablet Commonly known as: NORVASC Take 2.5 mg by mouth daily.   amoxicillin-clavulanate 500-125 MG tablet Commonly known as: AUGMENTIN Take 1 tablet (500 mg total) by mouth daily for 5 days. Start taking on: December 09, 2020   aspirin 81 MG chewable tablet Chew by mouth daily.   atorvastatin 40 MG tablet Commonly known as: LIPITOR Take 40 mg by mouth at bedtime.   calcium acetate 667 MG capsule Commonly known as: PHOSLO Take by mouth See admin instructions. Take 2 capsules by mouth with each meal and then 1 capsule with 2 snacks a day   diphenhydrAMINE 25 MG tablet Commonly known as: BENADRYL Take 25 mg by mouth at bedtime as needed.   escitalopram 10 MG tablet Commonly known as: LEXAPRO Take 10 mg by mouth daily.   Fluticasone-Salmeterol 250-50 MCG/DOSE Aepb Commonly known as: ADVAIR Inhale into the lungs.   insulin glargine 100 UNIT/ML injection Commonly  known as: LANTUS Inject 0.05 mLs (5 Units total) into the skin daily. Pt advised to take insulin according to her sugar check Start taking on: December 09, 2020   mirtazapine 7.5 MG tablet Commonly known as: REMERON Take 7.5 mg by mouth at bedtime.   multivitamin Tabs tablet Take 1 tablet by mouth daily.   omeprazole 40 MG capsule Commonly known as: PRILOSEC Take 40 mg by mouth daily.   oxyCODONE-acetaminophen 5-325 MG tablet Commonly known as: PERCOCET/ROXICET Take 1 tablet by mouth every 8 (eight) hours as needed for severe pain.   Potassium Chloride ER 20 MEQ Tbcr Take 1 tablet by mouth daily.   Trelegy Ellipta 100-62.5-25 MCG/INH Aepb Generic drug: Fluticasone-Umeclidin-Vilant Inhale into the lungs.            Discharge Care Instructions  (From admission, onward)         Start     Ordered   12/08/20 0000  Discharge wound care:       Comments: PD cath care per instructions   12/08/20 0947          If you experience worsening of your admission symptoms, develop shortness of breath, life threatening emergency, suicidal or homicidal thoughts you must seek medical attention immediately by calling 911 or calling your MD immediately  if symptoms less severe.  You Must read complete instructions/literature along with all the possible adverse reactions/side effects for all the Medicines you take and that have been prescribed to you. Take any new Medicines after  you have completely understood and accept all the possible adverse reactions/side effects.   Please note  You were cared for by a hospitalist during your hospital stay. If you have any questions about your discharge medications or the care you received while you were in the hospital after you are discharged, you can call the unit and asked to speak with the hospitalist on call if the hospitalist that took care of you is not available. Once you are discharged, your primary care physician will handle any further  medical issues. Please note that NO REFILLS for any discharge medications will be authorized once you are discharged, as it is imperative that you return to your primary care physician (or establish a relationship with a primary care physician if you do not have one) for your aftercare needs so that they can reassess your need for medications and monitor your lab values. Today   SUBJECTIVE   Tolerating PO diet. Finished all her breakfast. Continues to have some mild diarrhea. No fever.  VITAL SIGNS:  Blood pressure (!) 113/58, pulse 83, temperature (!) 97.4 F (36.3 C), resp. rate 14, height '5\' 7"'  (1.702 m), weight 51.3 kg, SpO2 100 %.  I/O:    Intake/Output Summary (Last 24 hours) at 12/08/2020 1056 Last data filed at 12/08/2020 0300 Gross per 24 hour  Intake -  Output 0 ml  Net 0 ml    PHYSICAL EXAMINATION:  GENERAL:  80 y.o.-year-old patient lying in the bed with no acute distress.  LUNGS: Normal breath sounds bilaterally, no wheezing, rales,rhonchi or crepitation. No use of accessory muscles of respiration.  CARDIOVASCULAR: S1, S2 normal. No murmurs, rubs, or gallops.  ABDOMEN: Soft, non-tender, non-distended. Bowel sounds present. No organomegaly or mass.  EXTREMITIES: No pedal edema, cyanosis, or clubbing.  NEUROLOGIC: Cranial nerves II through XII are intact. Muscle strength 5/5 in all extremities. Sensation intact. Gait not checked.  PSYCHIATRIC: The patient is alert and oriented x 3.  SKIN: No obvious rash, lesion, or ulcer.   DATA REVIEW:   CBC  Recent Labs  Lab 12/06/20 0419  WBC 10.0  HGB 11.4*  HCT 37.2  PLT 259    Chemistries  Recent Labs  Lab 12/05/20 0818 12/05/20 1130 12/07/20 0915  NA 129*   < > 128*  K 3.2*   < > 3.4*  CL 86*   < > 91*  CO2 22   < > 24  GLUCOSE 167*   < > 43*  BUN 35*   < > 45*  CREATININE 8.74*   < > 9.33*  CALCIUM 7.3*   < > 6.6*  MG 1.5*  --   --   AST 31  --   --   ALT 20  --   --   ALKPHOS 135*  --   --   BILITOT 0.5   --   --    < > = values in this interval not displayed.    Microbiology Results   Recent Results (from the past 240 hour(s))  C Difficile Quick Screen w PCR reflex     Status: Abnormal   Collection Time: 12/05/20  9:28 AM   Specimen: Stool  Result Value Ref Range Status   C Diff antigen POSITIVE (A) NEGATIVE Final   C Diff toxin NEGATIVE NEGATIVE Final   C Diff interpretation Results are indeterminate. See PCR results.  Final    Comment: Performed at [facility], [address] [place], [place] [address]  C. Diff  by PCR, Reflexed     Status: None   Collection Time: 12/05/20  9:28 AM  Result Value Ref Range Status   Toxigenic C. Difficile by PCR NEGATIVE NEGATIVE Final    Comment: Patient is colonized with non toxigenic C. difficile. May not need treatment unless significant symptoms are present. Performed at [facility], [place], [place] [address]   SARS CORONAVIRUS 2 (TAT 6-24 HRS) Nasopharyngeal Nasopharyngeal Swab     Status: None   Collection Time: 12/05/20 10:40 AM   Specimen: Nasopharyngeal Swab  Result Value Ref Range Status   SARS Coronavirus 2 NEGATIVE NEGATIVE Final    Comment: (NOTE) SARS-CoV-2 target nucleic acids are NOT DETECTED.  The SARS-CoV-2 RNA is generally detectable in upper and lower respiratory specimens during the acute phase of infection. Negative results do not preclude SARS-CoV-2 infection, do not rule out co-infections with other pathogens, and should not be used as the sole basis for treatment or other patient management decisions. Negative results must be combined with clinical observations, patient history, and epidemiological information. The expected result is Negative.  Fact Sheet for Patients: SugarRoll.be  Fact Sheet for Healthcare Providers: https://www.woods-mathews.com/  This test is not yet approved or cleared by the [place] FDA and  has been  authorized for detection and/or diagnosis of SARS-CoV-2 by FDA under an Emergency Use Authorization (EUA). This EUA will remain  in effect (meaning this test can be used) for the duration of the COVID-19 declaration under Se ction 564(b)(1) of the Act, 21 U.S.C. section 360bbb-3(b)(1), unless the authorization is terminated or revoked sooner.  Performed at [facility], [place] [address], [place], [place] [address]   Culture, blood (Routine X 2) w Reflex to ID Panel     Status: None (Preliminary result)   Collection Time: 12/05/20 11:30 AM   Specimen: BLOOD  Result Value Ref Range Status   Specimen Description BLOOD RIGHT ANTECUBITAL  Final   Special Requests   Final    BOTTLES DRAWN AEROBIC AND ANAEROBIC Blood Culture results may not be optimal due to an inadequate volume of blood received in culture bottles   Culture   Final    NO GROWTH 3 DAYS Performed at [facility], [address] [place], [place] [address]    Report Status PENDING  Incomplete  Culture, blood (Routine X 2) w Reflex to ID Panel     Status: None (Preliminary result)   Collection Time: 12/05/20 11:30 AM   Specimen: BLOOD  Result Value Ref Range Status   Specimen Description BLOOD BLOOD RIGHT WRIST  Final   Special Requests   Final    BOTTLES DRAWN AEROBIC AND ANAEROBIC Blood Culture results may not be optimal due to an inadequate volume of blood received in culture bottles   Culture   Final    NO GROWTH 3 DAYS Performed at [facility], [address] [place], [place] [address]    Report Status PENDING  Incomplete  Gastrointestinal Panel by PCR , Stool     Status: None   Collection Time: 12/05/20  4:40 PM   Specimen: Stool  Result Value Ref Range Status   Campylobacter species NOT DETECTED NOT DETECTED Final   Plesimonas shigelloides NOT DETECTED NOT DETECTED Final   Salmonella species NOT DETECTED NOT DETECTED Final   Yersinia enterocolitica NOT DETECTED NOT DETECTED Final    Vibrio species NOT DETECTED NOT DETECTED Final   Vibrio cholerae NOT DETECTED NOT DETECTED Final  Enteroaggregative E coli (EAEC) NOT DETECTED NOT DETECTED Final   Enteropathogenic E coli (EPEC) NOT DETECTED NOT DETECTED Final   Enterotoxigenic E coli (ETEC) NOT DETECTED NOT DETECTED Final   Shiga like toxin producing E coli (STEC) NOT DETECTED NOT DETECTED Final   Shigella/Enteroinvasive E coli (EIEC) NOT DETECTED NOT DETECTED Final   Cryptosporidium NOT DETECTED NOT DETECTED Final   Cyclospora cayetanensis NOT DETECTED NOT DETECTED Final   Entamoeba histolytica NOT DETECTED NOT DETECTED Final   Giardia lamblia NOT DETECTED NOT DETECTED Final   Adenovirus F40/41 NOT DETECTED NOT DETECTED Final   Astrovirus NOT DETECTED NOT DETECTED Final   Norovirus GI/GII NOT DETECTED NOT DETECTED Final   Rotavirus A NOT DETECTED NOT DETECTED Final   Sapovirus (I, II, IV, and V) NOT DETECTED NOT DETECTED Final    Comment: Performed at [facility], [address] [place], [place] [address]    RADIOLOGY:  No results found.   CODE STATUS:     Code Status Orders  (From admission, onward)         Start     Ordered   12/05/20 1412  Full code  Continuous        12/05/20 1411        Code Status History    Date Active Date Inactive Code Status Order ID Comments User Context   12/11/2019 1227 12/14/2019 1844 Full Code [id number]  Ivor Costa, MD ED   07/27/2019 2253 07/30/2019 1856 Full Code [id number]  Epifanio Lesches, MD Inpatient   Advance Care Planning Activity       TOTAL TIME TAKING CARE OF THIS PATIENT: *40* minutes.    Fritzi Mandes M.D  [facility]    CC: Primary care physician; Dion Body, MD

## 2020-12-08 NOTE — Progress Notes (Signed)
Patient is stable and ready for discharge home. Patient's IV removed. Writer went over discharge paperwork with patient and she verbalized understanding and had no questions. Patient dressed herself and packed her belongings. Patient transported via [facility] to her family car for discharge home.

## 2020-12-08 NOTE — Discharge Instructions (Signed)
Continue your peritoneal dialysis as before  Keep a log of your sugar check at home. Take your insulin accordingly. Discussed results of your sugar check with Dr. Gabriel Carina endocrinology

## 2020-12-10 ENCOUNTER — Ambulatory Visit: Payer: Medicare Other

## 2020-12-10 LAB — CULTURE, BLOOD (ROUTINE X 2)
Culture: NO GROWTH
Culture: NO GROWTH

## 2020-12-12 ENCOUNTER — Emergency Department: Payer: Medicare Other

## 2020-12-12 ENCOUNTER — Encounter: Payer: Self-pay | Admitting: Emergency Medicine

## 2020-12-12 ENCOUNTER — Other Ambulatory Visit: Payer: Self-pay

## 2020-12-12 ENCOUNTER — Inpatient Hospital Stay
Admission: EM | Admit: 2020-12-12 | Discharge: 2020-12-19 | DRG: 356 | Disposition: A | Discharge status: Hospice | Payer: Medicare Other | Attending: Internal Medicine | Admitting: Internal Medicine

## 2020-12-12 ENCOUNTER — Ambulatory Visit: Payer: Medicare Other

## 2020-12-12 DIAGNOSIS — Z7982 Long term (current) use of aspirin: Secondary | ICD-10-CM

## 2020-12-12 DIAGNOSIS — R1084 Generalized abdominal pain: Secondary | ICD-10-CM

## 2020-12-12 DIAGNOSIS — R571 Hypovolemic shock: Secondary | ICD-10-CM | POA: Diagnosis present

## 2020-12-12 DIAGNOSIS — J449 Chronic obstructive pulmonary disease, unspecified: Secondary | ICD-10-CM | POA: Diagnosis present

## 2020-12-12 DIAGNOSIS — E872 Acidosis, unspecified: Secondary | ICD-10-CM

## 2020-12-12 DIAGNOSIS — Z79899 Other long term (current) drug therapy: Secondary | ICD-10-CM

## 2020-12-12 DIAGNOSIS — I771 Stricture of artery: Secondary | ICD-10-CM | POA: Diagnosis not present

## 2020-12-12 DIAGNOSIS — Z8249 Family history of ischemic heart disease and other diseases of the circulatory system: Secondary | ICD-10-CM

## 2020-12-12 DIAGNOSIS — Z7189 Other specified counseling: Secondary | ICD-10-CM | POA: Diagnosis not present

## 2020-12-12 DIAGNOSIS — R748 Abnormal levels of other serum enzymes: Secondary | ICD-10-CM

## 2020-12-12 DIAGNOSIS — I10 Essential (primary) hypertension: Secondary | ICD-10-CM

## 2020-12-12 DIAGNOSIS — K567 Ileus, unspecified: Secondary | ICD-10-CM

## 2020-12-12 DIAGNOSIS — K633 Ulcer of intestine: Secondary | ICD-10-CM | POA: Diagnosis present

## 2020-12-12 DIAGNOSIS — R579 Shock, unspecified: Secondary | ICD-10-CM

## 2020-12-12 DIAGNOSIS — Z20822 Contact with and (suspected) exposure to covid-19: Secondary | ICD-10-CM | POA: Diagnosis present

## 2020-12-12 DIAGNOSIS — E1143 Type 2 diabetes mellitus with diabetic autonomic (poly)neuropathy: Secondary | ICD-10-CM | POA: Diagnosis present

## 2020-12-12 DIAGNOSIS — Z794 Long term (current) use of insulin: Secondary | ICD-10-CM

## 2020-12-12 DIAGNOSIS — E1165 Type 2 diabetes mellitus with hyperglycemia: Secondary | ICD-10-CM | POA: Diagnosis present

## 2020-12-12 DIAGNOSIS — E1122 Type 2 diabetes mellitus with diabetic chronic kidney disease: Secondary | ICD-10-CM | POA: Diagnosis present

## 2020-12-12 DIAGNOSIS — I120 Hypertensive chronic kidney disease with stage 5 chronic kidney disease or end stage renal disease: Secondary | ICD-10-CM | POA: Diagnosis present

## 2020-12-12 DIAGNOSIS — Z992 Dependence on renal dialysis: Secondary | ICD-10-CM

## 2020-12-12 DIAGNOSIS — G9341 Metabolic encephalopathy: Secondary | ICD-10-CM | POA: Diagnosis present

## 2020-12-12 DIAGNOSIS — D72823 Leukemoid reaction: Secondary | ICD-10-CM | POA: Diagnosis present

## 2020-12-12 DIAGNOSIS — Z515 Encounter for palliative care: Secondary | ICD-10-CM

## 2020-12-12 DIAGNOSIS — K3184 Gastroparesis: Secondary | ICD-10-CM | POA: Diagnosis present

## 2020-12-12 DIAGNOSIS — E871 Hypo-osmolality and hyponatremia: Secondary | ICD-10-CM | POA: Diagnosis present

## 2020-12-12 DIAGNOSIS — K5792 Diverticulitis of intestine, part unspecified, without perforation or abscess without bleeding: Secondary | ICD-10-CM | POA: Diagnosis present

## 2020-12-12 DIAGNOSIS — E785 Hyperlipidemia, unspecified: Secondary | ICD-10-CM | POA: Diagnosis not present

## 2020-12-12 DIAGNOSIS — I959 Hypotension, unspecified: Secondary | ICD-10-CM | POA: Diagnosis not present

## 2020-12-12 DIAGNOSIS — Z221 Carrier of other intestinal infectious diseases: Secondary | ICD-10-CM

## 2020-12-12 DIAGNOSIS — Z66 Do not resuscitate: Secondary | ICD-10-CM | POA: Diagnosis not present

## 2020-12-12 DIAGNOSIS — E876 Hypokalemia: Secondary | ICD-10-CM | POA: Diagnosis present

## 2020-12-12 DIAGNOSIS — Z833 Family history of diabetes mellitus: Secondary | ICD-10-CM

## 2020-12-12 DIAGNOSIS — D631 Anemia in chronic kidney disease: Secondary | ICD-10-CM | POA: Diagnosis present

## 2020-12-12 DIAGNOSIS — Z888 Allergy status to other drugs, medicaments and biological substances status: Secondary | ICD-10-CM

## 2020-12-12 DIAGNOSIS — K559 Vascular disorder of intestine, unspecified: Secondary | ICD-10-CM | POA: Diagnosis not present

## 2020-12-12 DIAGNOSIS — N186 End stage renal disease: Secondary | ICD-10-CM | POA: Diagnosis not present

## 2020-12-12 DIAGNOSIS — I739 Peripheral vascular disease, unspecified: Secondary | ICD-10-CM

## 2020-12-12 DIAGNOSIS — K859 Acute pancreatitis without necrosis or infection, unspecified: Secondary | ICD-10-CM | POA: Diagnosis present

## 2020-12-12 DIAGNOSIS — K551 Chronic vascular disorders of intestine: Principal | ICD-10-CM | POA: Diagnosis present

## 2020-12-12 DIAGNOSIS — K529 Noninfective gastroenteritis and colitis, unspecified: Secondary | ICD-10-CM | POA: Diagnosis present

## 2020-12-12 DIAGNOSIS — I708 Atherosclerosis of other arteries: Secondary | ICD-10-CM | POA: Diagnosis present

## 2020-12-12 DIAGNOSIS — N2581 Secondary hyperparathyroidism of renal origin: Secondary | ICD-10-CM | POA: Diagnosis present

## 2020-12-12 DIAGNOSIS — M79603 Pain in arm, unspecified: Secondary | ICD-10-CM | POA: Diagnosis present

## 2020-12-12 DIAGNOSIS — I6523 Occlusion and stenosis of bilateral carotid arteries: Secondary | ICD-10-CM | POA: Diagnosis present

## 2020-12-12 DIAGNOSIS — K219 Gastro-esophageal reflux disease without esophagitis: Secondary | ICD-10-CM | POA: Diagnosis present

## 2020-12-12 DIAGNOSIS — F17210 Nicotine dependence, cigarettes, uncomplicated: Secondary | ICD-10-CM | POA: Diagnosis present

## 2020-12-12 DIAGNOSIS — I701 Atherosclerosis of renal artery: Secondary | ICD-10-CM | POA: Diagnosis present

## 2020-12-12 LAB — BODY FLUID CELL COUNT WITH DIFFERENTIAL
Eos, Fluid: 0 %
Lymphs, Fluid: 10 %
Monocyte-Macrophage-Serous Fluid: 43 %
Neutrophil Count, Fluid: 47 %
Total Nucleated Cell Count, Fluid: 144 cu mm

## 2020-12-12 LAB — COMPREHENSIVE METABOLIC PANEL
ALT: 20 U/L (ref 0–44)
AST: 29 U/L (ref 15–41)
Albumin: 2 g/dL — ABNORMAL LOW (ref 3.5–5.0)
Alkaline Phosphatase: 128 U/L — ABNORMAL HIGH (ref 38–126)
Anion gap: 21 — ABNORMAL HIGH (ref 5–15)
BUN: 33 mg/dL — ABNORMAL HIGH (ref 8–23)
CO2: 20 mmol/L — ABNORMAL LOW (ref 22–32)
Calcium: 7.7 mg/dL — ABNORMAL LOW (ref 8.9–10.3)
Chloride: 89 mmol/L — ABNORMAL LOW (ref 98–111)
Creatinine, Ser: 9.12 mg/dL — ABNORMAL HIGH (ref 0.44–1.00)
GFR, Estimated: 4 mL/min — ABNORMAL LOW (ref >60)
Glucose, Bld: 136 mg/dL — ABNORMAL HIGH (ref 70–99)
Potassium: 3.4 mmol/L — ABNORMAL LOW (ref 3.5–5.1)
Sodium: 130 mmol/L — ABNORMAL LOW (ref 135–145)
Total Bilirubin: 0.4 mg/dL (ref 0.3–1.2)
Total Protein: 5.3 g/dL — ABNORMAL LOW (ref 6.5–8.1)

## 2020-12-12 LAB — CBC
HCT: 46 % (ref 36.0–46.0)
Hemoglobin: 14.4 g/dL (ref 12.0–15.0)
MCH: 29.7 pg (ref 26.0–34.0)
MCHC: 31.3 g/dL (ref 30.0–36.0)
MCV: 94.8 fL (ref 80.0–100.0)
Platelets: 293 10*3/uL (ref 150–400)
RBC: 4.85 MIL/uL (ref 3.87–5.11)
RDW: 19.7 % — ABNORMAL HIGH (ref 11.5–15.5)
WBC: 29.8 10*3/uL — ABNORMAL HIGH (ref 4.0–10.5)
nRBC: 0.2 % (ref 0.0–0.2)

## 2020-12-12 LAB — LACTIC ACID, PLASMA
Lactic Acid, Venous: 5.6 mmol/L (ref 0.5–1.9)
Lactic Acid, Venous: 3 mmol/L (ref 0.5–1.9)

## 2020-12-12 LAB — MAGNESIUM: Magnesium: 1.6 mg/dL — ABNORMAL LOW (ref 1.7–2.4)

## 2020-12-12 LAB — GLUCOSE, CAPILLARY: Glucose-Capillary: 182 mg/dL — ABNORMAL HIGH (ref 70–99)

## 2020-12-12 LAB — LIPASE, BLOOD: Lipase: 836 U/L — ABNORMAL HIGH (ref 11–51)

## 2020-12-12 MED ORDER — IOHEXOL 9 MG/ML PO SOLN: 500.0000 mL | ORAL | Status: DC

## 2020-12-12 MED ORDER — HEPARIN SODIUM (PORCINE) 5000 UNIT/ML IJ SOLN
5000.0000 [IU] | Freq: Three times a day (TID) | INTRAMUSCULAR | Status: DC
  Administered 2020-12-12 – 2020-12-18 (×15): 5000 [IU] via SUBCUTANEOUS
  Filled 2020-12-12 (×15): qty 1

## 2020-12-12 MED ORDER — LACTATED RINGERS IV BOLUS
500.0000 mL | Freq: Once | INTRAVENOUS | Status: AC
  Administered 2020-12-12: 500 mL via INTRAVENOUS

## 2020-12-12 MED ORDER — SODIUM CHLORIDE 0.9 % IV SOLN
INTRAVENOUS | Status: DC | PRN
  Administered 2020-12-12 – 2020-12-16 (×3): 250 mL via INTRAVENOUS

## 2020-12-12 MED ORDER — FLUTICASONE FUROATE-VILANTEROL 100-25 MCG/INH IN AEPB
1.0000 | INHALATION_SPRAY | Freq: Every day | RESPIRATORY_TRACT | Status: DC
  Filled 2020-12-12: qty 28

## 2020-12-12 MED ORDER — FLUTICASONE-UMECLIDIN-VILANT 100-62.5-25 MCG/INH IN AEPB: 1.0000 | INHALATION_SPRAY | Freq: Every day | RESPIRATORY_TRACT | Status: DC

## 2020-12-12 MED ORDER — ASPIRIN 81 MG PO CHEW
81.0000 mg | CHEWABLE_TABLET | Freq: Every day | ORAL | Status: DC
  Administered 2020-12-13: 81 mg via ORAL
  Filled 2020-12-12: qty 1

## 2020-12-12 MED ORDER — SODIUM CHLORIDE 0.9 % IV SOLN
2.0000 g | INTRAVENOUS | Status: DC
  Administered 2020-12-12 – 2020-12-13 (×2): 2 g via INTRAVENOUS
  Filled 2020-12-12: qty 2
  Filled 2020-12-12: qty 20
  Filled 2020-12-12: qty 2

## 2020-12-12 MED ORDER — VANCOMYCIN HCL 1250 MG/250ML IV SOLN
1250.0000 mg | Freq: Once | INTRAVENOUS | Status: AC
  Administered 2020-12-12: 1250 mg via INTRAVENOUS
  Filled 2020-12-12: qty 250

## 2020-12-12 MED ORDER — INSULIN ASPART 100 UNIT/ML ~~LOC~~ SOLN
0.0000 [IU] | Freq: Three times a day (TID) | SUBCUTANEOUS | Status: DC
  Administered 2020-12-17: 2 [IU] via SUBCUTANEOUS
  Administered 2020-12-17: 1 [IU] via SUBCUTANEOUS
  Filled 2020-12-12 (×2): qty 1

## 2020-12-12 MED ORDER — SODIUM CHLORIDE 0.9 % IV SOLN
2.0000 g | Freq: Once | INTRAVENOUS | Status: AC
  Administered 2020-12-12: 2 g via INTRAVENOUS
  Filled 2020-12-12: qty 2

## 2020-12-12 MED ORDER — MORPHINE SULFATE (PF) 4 MG/ML IV SOLN
4.0000 mg | Freq: Once | INTRAVENOUS | Status: AC
  Administered 2020-12-12: 4 mg via INTRAVENOUS
  Filled 2020-12-12: qty 1

## 2020-12-12 MED ORDER — RENA-VITE PO TABS
1.0000 | ORAL_TABLET | Freq: Every day | ORAL | Status: DC
  Administered 2020-12-13: 1 via ORAL
  Filled 2020-12-12: qty 1

## 2020-12-12 MED ORDER — ONDANSETRON HCL 4 MG/2ML IJ SOLN: 4.0000 mg | Freq: Once | INTRAMUSCULAR | Status: DC

## 2020-12-12 MED ORDER — ATORVASTATIN CALCIUM 20 MG PO TABS
40.0000 mg | ORAL_TABLET | Freq: Every day | ORAL | Status: DC
  Administered 2020-12-12 – 2020-12-13 (×2): 40 mg via ORAL
  Filled 2020-12-12 (×2): qty 2

## 2020-12-12 MED ORDER — PANTOPRAZOLE SODIUM 40 MG PO TBEC
40.0000 mg | DELAYED_RELEASE_TABLET | Freq: Every day | ORAL | Status: DC
Start: 2020-12-13 — End: 2020-12-14
  Administered 2020-12-13: 40 mg via ORAL
  Filled 2020-12-12: qty 1

## 2020-12-12 MED ORDER — IOHEXOL 9 MG/ML PO SOLN
500.0000 mL | ORAL | Status: AC
Start: 2020-12-12 — End: 2020-12-12

## 2020-12-12 MED ORDER — UMECLIDINIUM BROMIDE 62.5 MCG/INH IN AEPB
1.0000 | INHALATION_SPRAY | Freq: Every day | RESPIRATORY_TRACT | Status: DC
  Filled 2020-12-12: qty 7

## 2020-12-12 MED ORDER — ACETAMINOPHEN 650 MG RE SUPP: 650.0000 mg | Freq: Four times a day (QID) | RECTAL | Status: DC | PRN

## 2020-12-12 MED ORDER — ONDANSETRON HCL 4 MG/2ML IJ SOLN: 4.0000 mg | Freq: Once | INTRAMUSCULAR | Status: DC | PRN

## 2020-12-12 MED ORDER — ACETAMINOPHEN 325 MG PO TABS
650.0000 mg | ORAL_TABLET | Freq: Four times a day (QID) | ORAL | Status: DC | PRN
  Administered 2020-12-13: 650 mg via ORAL
  Filled 2020-12-12: qty 2

## 2020-12-12 MED ORDER — OXYCODONE-ACETAMINOPHEN 5-325 MG PO TABS
1.0000 | ORAL_TABLET | Freq: Three times a day (TID) | ORAL | Status: DC | PRN
  Administered 2020-12-12 – 2020-12-13 (×3): 1 via ORAL
  Filled 2020-12-12 (×3): qty 1

## 2020-12-12 MED ORDER — SODIUM CHLORIDE 0.9 % IV BOLUS
250.0000 mL | Freq: Once | INTRAVENOUS | Status: AC
Start: 2020-12-12 — End: 2020-12-12
  Administered 2020-12-12: 250 mL via INTRAVENOUS

## 2020-12-12 MED ORDER — METRONIDAZOLE IN NACL 5-0.79 MG/ML-% IV SOLN
500.0000 mg | Freq: Three times a day (TID) | INTRAVENOUS | Status: DC
  Administered 2020-12-12 – 2020-12-14 (×5): 500 mg via INTRAVENOUS
  Filled 2020-12-12 (×7): qty 100

## 2020-12-12 MED ORDER — INSULIN ASPART 100 UNIT/ML ~~LOC~~ SOLN: 0.0000 [IU] | Freq: Every day | SUBCUTANEOUS | Status: DC

## 2020-12-12 MED ORDER — MIRTAZAPINE 15 MG PO TABS
7.5000 mg | ORAL_TABLET | Freq: Every day | ORAL | Status: DC
  Administered 2020-12-12 – 2020-12-17 (×3): 7.5 mg via ORAL
  Filled 2020-12-12 (×3): qty 1

## 2020-12-12 MED ORDER — AMLODIPINE BESYLATE 5 MG PO TABS
2.5000 mg | ORAL_TABLET | Freq: Every day | ORAL | Status: DC
  Administered 2020-12-13: 2.5 mg via ORAL
  Filled 2020-12-12: qty 1

## 2020-12-12 MED ORDER — ONDANSETRON HCL 4 MG PO TABS: 4.0000 mg | ORAL_TABLET | Freq: Four times a day (QID) | ORAL | Status: DC | PRN

## 2020-12-12 MED ORDER — PROCHLORPERAZINE EDISYLATE 10 MG/2ML IJ SOLN
10.0000 mg | INTRAMUSCULAR | Status: AC
  Administered 2020-12-12: 10 mg via INTRAVENOUS

## 2020-12-12 MED ORDER — METRONIDAZOLE IN NACL 5-0.79 MG/ML-% IV SOLN
500.0000 mg | Freq: Once | INTRAVENOUS | Status: AC
  Administered 2020-12-12: 500 mg via INTRAVENOUS
  Filled 2020-12-12: qty 100

## 2020-12-12 MED ORDER — CALCIUM ACETATE (PHOS BINDER) 667 MG PO CAPS
1334.0000 mg | ORAL_CAPSULE | Freq: Three times a day (TID) | ORAL | Status: DC
  Administered 2020-12-13 (×3): 1334 mg via ORAL
  Filled 2020-12-12 (×3): qty 2

## 2020-12-12 MED ORDER — ONDANSETRON HCL 4 MG/2ML IJ SOLN: 4.0000 mg | Freq: Four times a day (QID) | INTRAMUSCULAR | Status: DC | PRN

## 2020-12-12 MED ORDER — ALBUTEROL SULFATE HFA 108 (90 BASE) MCG/ACT IN AERS
2.0000 | INHALATION_SPRAY | Freq: Four times a day (QID) | RESPIRATORY_TRACT | Status: DC | PRN
  Filled 2020-12-12: qty 6.7

## 2020-12-12 MED ORDER — VANCOMYCIN HCL IN DEXTROSE 1-5 GM/200ML-% IV SOLN: 1000.0000 mg | Freq: Once | INTRAVENOUS | Status: DC

## 2020-12-12 MED ORDER — ESCITALOPRAM OXALATE 10 MG PO TABS
10.0000 mg | ORAL_TABLET | Freq: Every day | ORAL | Status: DC
  Administered 2020-12-13: 10:00:00 10 mg via ORAL
  Filled 2020-12-12 (×6): qty 1

## 2020-12-12 NOTE — H&P (Signed)
[facility] NAME: Anita Wells    MR#:  [id number]  DATE OF BIRTH:  12/24/1940  DATE OF ADMISSION:  12/12/2020  PRIMARY CARE PHYSICIAN: Dion Body, MD   REQUESTING/REFERRING PHYSICIAN: Dr. Merlyn Lot.  CHIEF COMPLAINT:   Chief Complaint  Patient presents with  . Abdominal Pain    HISTORY OF PRESENT ILLNESS:  Anita Wells  is a 80 y.o. female with a known history of recent hospitalization with colitis and was given antibiotics.  Patient went home and still having abdominal pain.  She is also having diarrhea but no blood in the diarrhea.  She is also having nausea and vomited once but no blood in the vomit.  Pain is in the entire abdomen sometimes it can be very severe.  In the ER, she had a CT scan that was consistent with colitis and hospitalist services were contacted for admission.  White blood cell count very elevated at 29.8.  Sodium down at 130.  Magnesium down at 1.6 and potassium down to 3.4 and lipase elevated 836.  Patient also had lactic acidosis.  PAST MEDICAL HISTORY:   Past Medical History:  Diagnosis Date  . Anemia   . Barrett's esophagus   . Carotid stenosis, bilateral   . Chronic kidney disease   . GERD (gastroesophageal reflux disease)   . Hyperlipidemia   . Hyperparathyroidism ([place])   . Hypertension   . Renal artery stenosis (H[facility])    left    PAST SURGICAL HISTORY:   Past Surgical History:  Procedure Laterality Date  . A/V FISTULAGRAM Left 02/19/2019   Procedure: A/V FISTULAGRAM;  Surgeon: Algernon Huxley, MD;  Location: [facility];  Service: Cardiovascular;  Laterality: Left;  . AV FISTULA PLACEMENT  12/21/2018   Procedure: ARTERIOVENOUS (AV) FISTULA CREATION;  Surgeon: Algernon Huxley, MD;  Location: [facility];  Service: Vascular;;  . COLON RESECTION    . COLON SURGERY    . colonoscpy    . DIALYSIS/PERMA CATHETER INSERTION N/A 11/15/2018   Procedure: DIALYSIS/PERMA CATHETER INSERTION;   Surgeon: Algernon Huxley, MD;  Location: [facility];  Service: Cardiovascular;  Laterality: N/A;  . DIALYSIS/PERMA CATHETER REMOVAL N/A 09/27/2019   Procedure: DIALYSIS/PERMA CATHETER REMOVAL;  Surgeon: Algernon Huxley, MD;  Location: [facility];  Service: Cardiovascular;  Laterality: N/A;  . RENAL ARTERY STENT Left     SOCIAL HISTORY:   Social History   Tobacco Use  . Smoking status: Current Every Day Smoker    Packs/day: 0.25    Years: 50.00    Pack years: 12.50    Types: Cigarettes  . Smokeless tobacco: Never Used  . Tobacco comment: She states she is ready to quit 09/15/2020.  Substance Use Topics  . Alcohol use: Not Currently    FAMILY HISTORY:   Family History  Problem Relation Age of Onset  . Diabetes Mellitus II Mother   . Hypertension Mother   . Alcohol abuse Father     DRUG ALLERGIES:   Allergies  Allergen Reactions  . Clopidogrel Swelling    Mouth swelling  . Ace Inhibitors Cough  . Amlodipine Other (See Comments)    Peripheral edema    REVIEW OF SYSTEMS:  CONSTITUTIONAL: Positive fever and chills, positive for fatigue.  Positive for weight loss EYES: No blurred or double vision.  Wears glasses. EARS, NOSE, AND THROAT: No tinnitus or ear pain. No sore throat RESPIRATORY: No cough, shortness of breath,  wheezing or hemoptysis.  CARDIOVASCULAR: No chest pain, orthopnea, edema.  GASTROINTESTINAL: Positive for nausea, vomiting, diarrhea and abdominal pain. No blood in bowel movements or vomit GENITOURINARY: She does not urinate HEMATOLOGY: No anemia, easy bruising or bleeding SKIN: No rash or lesion. MUSCULOSKELETAL: Occasional joint pain.   NEUROLOGIC: No tingling, numbness, weakness.  PSYCHIATRY: No anxiety or depression.   MEDICATIONS AT HOME:   Prior to Admission medications   Medication Sig Start Date End Date Taking? Authorizing Provider  albuterol (VENTOLIN HFA) 108 (90 Base) MCG/ACT inhaler Inhale into the lungs. 04/18/20 04/18/21   [provider]  amLODipine (NORVASC) 2.5 MG tablet Take 2.5 mg by mouth daily.    [provider]  amoxicillin-clavulanate (AUGMENTIN) 500-125 MG tablet Take 1 tablet (500 mg total) by mouth daily for 5 days. 12/09/20 12/14/20  Fritzi Mandes, MD  aspirin 81 MG chewable tablet Chew by mouth daily.    [provider]  atorvastatin (LIPITOR) 40 MG tablet Take 40 mg by mouth at bedtime.     [provider]  calcium acetate (PHOSLO) 667 MG capsule Take by mouth See admin instructions. Take 2 capsules by mouth with each meal and then 1 capsule with 2 snacks a day    [provider]  diphenhydrAMINE (BENADRYL) 25 MG tablet Take 25 mg by mouth at bedtime as needed.    [provider]  escitalopram (LEXAPRO) 10 MG tablet Take 10 mg by mouth daily.    [provider]  Fluticasone-Salmeterol (ADVAIR) 250-50 MCG/DOSE AEPB Inhale into the lungs. 04/18/20 04/18/21  [provider]  Fluticasone-Umeclidin-Vilant (TRELEGY ELLIPTA) 100-62.5-25 MCG/INH AEPB Inhale into the lungs. 09/09/20   [provider]  insulin glargine (LANTUS) 100 UNIT/ML injection Inject 0.05 mLs (5 Units total) into the skin daily. Pt advised to take insulin according to her sugar check 12/09/20   Fritzi Mandes, MD  mirtazapine (REMERON) 7.5 MG tablet Take 7.5 mg by mouth at bedtime.    [provider]  multivitamin (RENA-VIT) TABS tablet Take 1 tablet by mouth daily.     [provider]  omeprazole (PRILOSEC) 40 MG capsule Take 40 mg by mouth daily.    [provider]  oxyCODONE-acetaminophen (PERCOCET/ROXICET) 5-325 MG tablet Take 1 tablet by mouth every 8 (eight) hours as needed for severe pain. 12/08/20   Fritzi Mandes, MD  Potassium Chloride ER 20 MEQ TBCR Take 1 tablet by mouth daily. 04/20/20   [provider]      VITAL SIGNS:  Blood pressure (!) 160/79, pulse 94, temperature (!) 97.5 F (36.4 C), temperature source Oral, resp.  rate 13, height '5\' 6"'$  (1.676 m), weight 54.4 kg, SpO2 100 %.  PHYSICAL EXAMINATION:  GENERAL:  80 y.o.-year-old patient lying in the bed with no acute distress.  EYES: Pupils equal, round, reactive to light and accommodation. No scleral icterus. Extraocular muscles intact.  HEENT: Head atraumatic, normocephalic. Oropharynx and nasopharynx clear.  NECK:  Supple, no jugular venous distention. No thyroid enlargement, no tenderness.  LUNGS: Normal breath sounds bilaterally, no wheezing, rales,rhonchi or crepitation. No use of accessory muscles of respiration.  CARDIOVASCULAR: S1, S2 normal. No murmurs, rubs, or gallops.  ABDOMEN: Soft, tender throughout the entire abdomen especially in the epigastric area. nondistended. Bowel sounds present. No organomegaly or mass.  EXTREMITIES: Trace pedal edema.  NEUROLOGIC: Cranial nerves II through XII are intact. Muscle strength 5/5 in all extremities. Sensation intact. Gait not checked.  PSYCHIATRIC: The patient is alert and oriented x 3.  SKIN: No rash, lesion, or ulcer.   LABORATORY PANEL:   CBC Recent Labs  Lab 12/12/20 1241  WBC 29.8*  HGB 14.4  HCT 46.0  PLT 293   ------------------------------------------------------------------------------------------------------------------  Chemistries  Recent Labs  Lab 12/12/20 1350  NA 130*  K 3.4*  CL 89*  CO2 20*  GLUCOSE 136*  BUN 33*  CREATININE 9.12*  CALCIUM 7.7*  MG 1.6*  AST 29  ALT 20  ALKPHOS 128*  BILITOT 0.4   ------------------------------------------------------------------------------------------------------------------   RADIOLOGY:  CT Abdomen Pelvis Wo Contrast  Result Date: 12/12/2020 CLINICAL DATA:  Colitis, sepsis, worsening symptoms, weaker; history hypertension, end-stage renal disease on dialysis, type II diabetes mellitus EXAM: CT ABDOMEN AND PELVIS WITHOUT CONTRAST TECHNIQUE: Multidetector CT imaging of the abdomen and pelvis was performed following the  standard protocol without IV contrast. Sagittal and coronal MPR images reconstructed from axial data set. No oral contrast administered. COMPARISON:  12/05/2020 FINDINGS: Lower chest: Lung bases clear Hepatobiliary: Gallbladder surgically absent. No focal hepatic masses. Mild intrahepatic and extrahepatic biliary dilatation. Pancreas: Normal appearance Spleen: Normal appearance Adrenals/Urinary Tract: Bladder decompressed. Adrenal gland thickening without discrete mass. Small BILATERAL renal cysts, suboptimally characterized. Additional hyperdense lesion at inferior pole LEFT kidney 10 mm diameter unchanged. Stomach/Bowel: Short segment of normal appendix visualized. Diffuse colonic wall thickening involving the ascending and descending colon consistent with colitis. Distal sigmoid anastomotic staple line. Tiny hiatal hernia. Stomach otherwise unremarkable. Suboptimal assessment of small-bowel wall thickness due to lack of IV and oral contrast but question diffusely thickened Vascular/Lymphatic: Extensive atherosclerotic calcifications involving coronary arteries, aorta, iliac arteries, visceral arteries. Dilatation of distal abdominal aorta 2.6 cm diameter versus 1.5 cm diameter proximal diameter. Displaced intimal calcifications consistent with chronic dissection. Reproductive: Atrophic uterus.  Unremarkable adnexa Other: Scattered ascites throughout abdomen and pelvis. Peritoneal dialysis catheter present. No free air. No hernia. Musculoskeletal: Degenerative disc disease changes L4-L5. IMPRESSION: Scattered colonic wall thickening involving the ascending and descending colon as well as splenic flexure and questionably small bowel loops; this could reflect inflammatory bowel disease or infection, ischemia considered less likely but not entirely excluded. Little change in bowel wall thickening since previous study. Peritoneal dialysis catheter with scattered ascites. Mild intrahepatic and extrahepatic biliary  dilatation, likely related to prior cholecystectomy, recommend correlation with LFTs. Tiny hiatal hernia. 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 This recommendation follows ACR consensus guidelines: White Paper of the ACR Incidental Findings Committee II on Vascular Findings. J Am Coll Radiol 2013; 10:789-794. Aortic Atherosclerosis (ICD10-I70.0). Electronically Signed   By: Lavonia Dana M.D.   On: 12/12/2020 15:05   DG Chest Portable 1 View  Result Date: 12/12/2020 CLINICAL DATA:  80 year old female with nausea and vomiting. EXAM: PORTABLE CHEST 1 VIEW COMPARISON:  Chest radiograph dated 12/11/2019 FINDINGS: Background of emphysema. There is minimal eventration of the left hemidiaphragm with probable minimal left lung base atelectasis. Overall interval improvement in aeration of the lungs compared to prior radiograph. No focal consolidation, pleural effusion or pneumothorax. The cardiac silhouette is within limits. Atherosclerotic calcification of the aorta. No acute osseous pathology. IMPRESSION: 1. No acute cardiopulmonary process. 2. Emphysema. Electronically Signed   By: Anner Crete M.D.   On: 12/12/2020 16:45    EKG:   Interpreted by me sinus rhythm 90 bpm, PVCs, flipped T waves laterally  IMPRESSION AND  PLAN:   1.  Acute colitis with leukocytosis and lactic acidosis.  Recent hospitalization for similar complaint.  Will get GI consultation. Empiric Flagyl and Rocephin.  Received antibiotics in the emergency room.  Send off stool studies.  Will give fluid bolus.  This could be ischemic colitis with atherosclerosis seen on CT scan of numerous arteries and tobacco abuse and diabetes.. 2.  Abdominal pain with history of peritoneal dialysis.  Consulted nephrology to send off fluid  from PD catheter to rule out SBP. 3.  Elevated lipase.  Unclear significance in the dialysis patient will give a fluid bolus and recheck tomorrow.  CT scan did not comment on inflamed pancreas.  Patient's gallbladder has already been removed in the past. 4.  Type 2 diabetes mellitus with end-stage renal disease.  Consult nephrology for peritoneal dialysis.  With repeated abdominal issues peritoneal dialysis may not be the ideal choice.  Hold on Lantus at this point and just use sliding scale since n.p.o. 5.  Essential hypertension on Norvasc 6.  COPD on Trelegy inhaler and as needed albuterol inhaler.  She states that she wears oxygen as needed during the day and at night continuous. 7.  Hyperlipidemia unspecified on Lipitor 8.  Anxiety depression on Lexapro 9.  Tobacco abuse.  Refused nicotine patch. 10.  Hypokalemia and hypomagnesemia on dialysis to help electrolytes.    All the laboratory, radiological and EKG data and prior records are reviewed and case discussed with ED provider. Management plans discussed with the patient, and patient's daughter and they are in agreement.  Patient requires inpatient status with lactic acidosis elevated white count and repeated hospital admission for colitis.  CODE STATUS: full code  TOTAL TIME TAKING CARE OF THIS PATIENT: 55 minutes.    Loletha Grayer M.D on 12/12/2020 at 5:38 PM  Between 7am to 6pm - Pager - [phone]  After 6pm call admission pager [phone]  [facility]  CC: Primary care physician; Dion Body, MD

## 2020-12-12 NOTE — ED Provider Notes (Signed)
[facility] Emergency Department Provider Note   ____________________________________________   Event Date/Time   First MD Initiated Contact with Patient 12/12/20 1253     (approximate)  I have reviewed the triage vital signs and the nursing notes.   HISTORY  Chief Complaint Abdominal Pain    HPI Anita Wells is a 80 y.o. female with past medical history of hypertension, hyperlipidemia, diabetes, GERD, and ESRD on PD who presents to the ED with abdominal pain.  Patient was admitted to the hospital about 1 week ago for diffuse abdominal pain associated with diarrhea and vomiting.  She was diagnosed with colitis at that time, treated with IV Zosyn followed by Augmentin.  She seemed to be improving and was discharged from the hospital 4 days ago, but patient states she has been worsening since then.  She complains of diffuse crampy abdominal pain with ongoing diarrhea, nausea, and vomiting.  She has not had any fevers and denies any cough, chest pain, or shortness of breath.  She has not noticed any blood in her stool or emesis.  She went to follow-up with her PCP earlier today and was referred to the ED due to worsening symptoms.  Her daughter has been able to administer her nightly PD recently without difficulty.        Past Medical History:  Diagnosis Date  . Anemia   . Barrett's esophagus   . Carotid stenosis, bilateral   . Chronic kidney disease   . GERD (gastroesophageal reflux disease)   . Hyperlipidemia   . Hyperparathyroidism ([place])   . Hypertension   . Renal artery stenosis [facility])    left    Patient Active Problem List   Diagnosis Date Noted  . Acute colitis 12/05/2020  . Type II diabetes mellitus with renal manifestations ([place]) 12/05/2020  . Sepsis ([place]) 12/05/2020  . Hyponatremia 12/05/2020  . GERD (gastroesophageal reflux disease) 12/05/2020  . Depression 12/05/2020  . Tobacco abuse 12/05/2020  . Anemia in ESRD (end-stage renal  disease) ([place]) 12/05/2020  . Hypomagnesemia 12/05/2020  . Type 2 diabetes mellitus with hyperosmolar hyperglycemic state (HHS) ([place]) 12/11/2019  . Fluid overload 12/11/2019  . Hypokalemia 12/11/2019  . Leukocytosis 12/11/2019  . ESRD on peritoneal dialysis ([place])   . Shortness of breath 07/27/2019  . ESRD on dialysis ([place]) 11/22/2018  . Essential hypertension 11/22/2018  . Hyperlipidemia 11/22/2018  . Anemia due to chronic kidney disease 11/12/2018    Past Surgical History:  Procedure Laterality Date  . A/V FISTULAGRAM Left 02/19/2019   Procedure: A/V FISTULAGRAM;  Surgeon: Algernon Huxley, MD;  Location: [facility] [facility];  Service: Cardiovascular;  Laterality: Left;  . AV FISTULA PLACEMENT  12/21/2018   Procedure: ARTERIOVENOUS (AV) FISTULA CREATION;  Surgeon: Algernon Huxley, MD;  Location: [facility];  Service: Vascular;;  . COLON RESECTION    . COLON SURGERY    . colonoscpy    . DIALYSIS/PERMA CATHETER INSERTION N/A 11/15/2018   Procedure: DIALYSIS/PERMA CATHETER INSERTION;  Surgeon: Algernon Huxley, MD;  Location: [facility];  Service: Cardiovascular;  Laterality: N/A;  . DIALYSIS/PERMA CATHETER REMOVAL N/A 09/27/2019   Procedure: DIALYSIS/PERMA CATHETER REMOVAL;  Surgeon: Algernon Huxley, MD;  Location: [facility];  Service: Cardiovascular;  Laterality: N/A;  . RENAL ARTERY STENT Left     Prior to Admission medications   Medication Sig Start Date End Date Taking? Authorizing Provider  albuterol (VENTOLIN HFA) 108 (90 Base) MCG/ACT inhaler Inhale into the  lungs. 04/18/20 04/18/21  [provider]  amLODipine (NORVASC) 2.5 MG tablet Take 2.5 mg by mouth daily.    [provider]  amoxicillin-clavulanate (AUGMENTIN) 500-125 MG tablet Take 1 tablet (500 mg total) by mouth daily for 5 days. 12/09/20 12/14/20  Fritzi Mandes, MD  aspirin 81 MG chewable tablet Chew by mouth daily.    [provider]  atorvastatin (LIPITOR) 40 MG tablet Take 40 mg by mouth  at bedtime.     [provider]  calcium acetate (PHOSLO) 667 MG capsule Take by mouth See admin instructions. Take 2 capsules by mouth with each meal and then 1 capsule with 2 snacks a day    [provider]  diphenhydrAMINE (BENADRYL) 25 MG tablet Take 25 mg by mouth at bedtime as needed.    [provider]  escitalopram (LEXAPRO) 10 MG tablet Take 10 mg by mouth daily.    [provider]  Fluticasone-Salmeterol (ADVAIR) 250-50 MCG/DOSE AEPB Inhale into the lungs. 04/18/20 04/18/21  [provider]  Fluticasone-Umeclidin-Vilant (TRELEGY ELLIPTA) 100-62.5-25 MCG/INH AEPB Inhale into the lungs. 09/09/20   [provider]  insulin glargine (LANTUS) 100 UNIT/ML injection Inject 0.05 mLs (5 Units total) into the skin daily. Pt advised to take insulin according to her sugar check 12/09/20   Fritzi Mandes, MD  mirtazapine (REMERON) 7.5 MG tablet Take 7.5 mg by mouth at bedtime.    [provider]  multivitamin (RENA-VIT) TABS tablet Take 1 tablet by mouth daily.     [provider]  omeprazole (PRILOSEC) 40 MG capsule Take 40 mg by mouth daily.    [provider]  oxyCODONE-acetaminophen (PERCOCET/ROXICET) 5-325 MG tablet Take 1 tablet by mouth every 8 (eight) hours as needed for severe pain. 12/08/20   Fritzi Mandes, MD  Potassium Chloride ER 20 MEQ TBCR Take 1 tablet by mouth daily. 04/20/20   [provider]    Allergies Clopidogrel, Ace inhibitors, and Amlodipine  Family History  Problem Relation Age of Onset  . Diabetes Mellitus II Mother     Social History Social History   Tobacco Use  . Smoking status: Current Every Day Smoker    Packs/day: 0.25    Years: 50.00    Pack years: 12.50    Types: Cigarettes  . Smokeless tobacco: Never Used  . Tobacco comment: She states she is ready to quit 09/15/2020.  Vaping Use  . Vaping Use: Never used  Substance Use Topics  . Alcohol use: Not Currently  . Drug use:  Never    Review of Systems  Constitutional: No fever/chills Eyes: No visual changes. ENT: No sore throat. Cardiovascular: Denies chest pain. Respiratory: Denies shortness of breath. Gastrointestinal: Positive for abdominal pain, nausea, vomiting, and diarrhea.  No constipation. Genitourinary: Negative for dysuria. Musculoskeletal: Negative for back pain. Skin: Negative for rash. Neurological: Negative for headaches, focal weakness or numbness.  ____________________________________________   PHYSICAL EXAM:  VITAL SIGNS: ED Triage Vitals  Enc Vitals Group     BP 12/12/20 1217 111/76     Pulse Rate 12/12/20 1217 89     Resp 12/12/20 1217 16     Temp 12/12/20 1217 (!) 97.5 F (36.4 C)     Temp Source 12/12/20 1217 Oral     SpO2 12/12/20 1224 95 %     Weight 12/12/20 1220 120 lb (54.4 kg)     Height 12/12/20 1220 '5\' 6"'$  (1.676 m)     Head Circumference --  Peak Flow --      Pain Score 12/12/20 1219 10     Pain Loc --      Pain Edu? --      Excl. in [place]? --     Constitutional: Alert and oriented. Eyes: Conjunctivae are normal. Head: Atraumatic. Nose: No congestion/rhinnorhea. Mouth/Throat: Mucous membranes are moist. Neck: Normal ROM Cardiovascular: Normal rate, regular rhythm. Grossly normal heart sounds. Respiratory: Normal respiratory effort.  No retractions. Lungs CTAB. Gastrointestinal: Soft and diffusely tender to palpation with no rebound or guarding. No distention.  PD catheter site intact with no erythema, warmth, or drainage. Genitourinary: deferred Musculoskeletal: No lower extremity tenderness nor edema. Neurologic:  Normal speech and language. No gross focal neurologic deficits are appreciated. Skin:  Skin is warm, dry and intact. No rash noted. Psychiatric: Mood and affect are normal. Speech and behavior are normal.  ____________________________________________   LABS (all labs ordered are listed, but only abnormal results are displayed)  Labs  Reviewed  CBC - Abnormal; Notable for the following components:      Result Value   WBC 29.8 (*)    RDW 19.7 (*)    All other components within normal limits  LACTIC ACID, PLASMA - Abnormal; Notable for the following components:   Lactic Acid, Venous 5.6 (*)    All other components within normal limits  CULTURE, BLOOD (ROUTINE X 2)  CULTURE, BLOOD (ROUTINE X 2)  GASTROINTESTINAL PANEL BY PCR, STOOL (REPLACES STOOL CULTURE)  C DIFFICILE (CDIFF) QUICK SCRN (NO PCR REFLEX)  BODY FLUID CULTURE  SARS CORONAVIRUS 2 (TAT 6-24 HRS)  URINALYSIS, COMPLETE (UACMP) WITH MICROSCOPIC  COMPREHENSIVE METABOLIC PANEL  LIPASE, BLOOD  MAGNESIUM  LACTIC ACID, PLASMA  BODY FLUID CELL COUNT WITH DIFFERENTIAL   ____________________________________________  EKG  ED ECG REPORT I, Blake Divine, the attending physician, personally viewed and interpreted this ECG.   Date: 12/12/2020  EKG Time: 12:58  Rate: 90  Rhythm: normal sinus rhythm, frequent PVC  Axis: Normal  Intervals:prolonged QT  ST&T Change: None   PROCEDURES  Procedure(s) performed (including Critical Care):  Procedures   ____________________________________________   INITIAL IMPRESSION / ASSESSMENT AND PLAN / ED COURSE       80 year old female with past medical history of hypertension, hyperlipidemia, diabetes, ESRD on PD, and GERD who presents to the ED for ongoing vomiting, diarrhea, and abdominal pain following admission 1 week ago for colitis.  Patient does appear dehydrated, also has diffuse tenderness of her abdomen.  We will hydrate with IV fluids and recheck CT abdomen/pelvis, treat pain with IV morphine.  We will treat her nausea and vomiting with Compazine given her prolonged QT.  She recently had culture of her peritoneal fluid that showed no growth, however given her ongoing symptoms we will repeat.  Patient's labs thus far show significant leukocytosis and lactic acidosis, we will start broad-spectrum  antibiotics. CT, labs, and peritoneal fluid aspiration are pending at this time. Patient turned red over to oncoming provider pending additional labs and admission.      ____________________________________________   FINAL CLINICAL IMPRESSION(S) / ED DIAGNOSES  Final diagnoses:  Generalized abdominal pain     ED Discharge Orders    None       Note:  This document was prepared using Dragon voice recognition software and may include unintentional dictation errors.   Blake Divine, MD 12/12/20 1501

## 2020-12-12 NOTE — Progress Notes (Signed)
Treatment started at this time without any issues or concerns. Effluent clear and transparent with something readable underneath. Dr. Candiss Norse made aware of sample obtained. Treatment: Fill volume of 1800 ml, dwell time 2 hrs and 9 minutes, treatment time of 10 hrs.

## 2020-12-12 NOTE — ED Notes (Signed)
Patient is resting comfortably. 

## 2020-12-12 NOTE — ED Notes (Signed)
Pt c/o abd pain x 2 weeks. Pt states has been increasing since being seen last week. Pt also c/o increasing weakness, diarrhea and decreased PO intake. Pt also does PD dialysis daily.

## 2020-12-12 NOTE — ED Notes (Addendum)
First RN note:  Pt brought over by [facility] explaining that pt was d/c from admission on 2/13 for colitis and sepsis.  Pt also received dialysis and has not eaten in 5 days.  Pt significantly getting worse and weaker.  Concerns for dehydration.

## 2020-12-12 NOTE — ED Notes (Signed)
Lab at bedside

## 2020-12-12 NOTE — ED Notes (Signed)
Difficulties obtaining outstanding specimens discussed with receiving nurse during handoff.  Patient reports she does not make much urine, and has not had a BM while in the ED today.

## 2020-12-12 NOTE — Consult Note (Signed)
CODE SEPSIS - PHARMACY COMMUNICATION  **Broad Spectrum Antibiotics should be administered within 1 hour of Sepsis diagnosis**  Time Code Sepsis Called/Page Received: 1459  Antibiotics Ordered: 1459  Time of 1st antibiotic administration: [id number]  Additional action taken by pharmacy: N/A  If necessary, Name of Provider/Nurse Contacted: N/A    Darnelle Bos ,PharmD Clinical Pharmacist  12/12/2020  3:03 PM

## 2020-12-12 NOTE — ED Notes (Signed)
IV team at bedside 

## 2020-12-12 NOTE — Consult Note (Signed)
PHARMACY -  BRIEF ANTIBIOTIC NOTE   Pharmacy has received consult(s) for cefepime and vancomycin from an ED provider.  The patient's profile has been reviewed for ht/wt/allergies/indication/available labs.    One time order(s) placed for cefepime 2 g IV and vancomycin 1250 mg IV  Further antibiotics/pharmacy consults should be ordered by admitting physician if indicated.                       Thank you, Darnelle Bos, PharmD 12/12/2020  3:07 PM

## 2020-12-12 NOTE — ED Notes (Signed)
Lab at bedside at this time, pt's family member at bedside. This RN informed patient's family member and EDP that per dialysis earliest patient would be able to have PD catheter accessed by dialysis RN would be 1730

## 2020-12-12 NOTE — ED Notes (Signed)
Lab at bedside again to draw labs.

## 2020-12-12 NOTE — ED Triage Notes (Signed)
Pt here with c/o LUQ abd pain, was admitted here last week and discharged on Monday, states she hasn't felt good all week. States she feels "weak all over." Pt alert and oriented x4. Uses peritoneal dialysis nightly. States she is having vomiting and diarrhea, hasn't eaten anything in 2 days. Appears weak. Daughter in for triage.

## 2020-12-12 NOTE — ED Notes (Signed)
Dialysis nurse reports they will be to bedside to collect PD sample around 5:30pm. Patient aware of delay.

## 2020-12-12 NOTE — ED Notes (Signed)
This RN called dialysis to obtained sample from PD catheter and called lab to obtained blood cultures due to patient being difficult stick.

## 2020-12-12 NOTE — ED Notes (Signed)
Patient pending admission. Hospitalist at bedside.

## 2020-12-13 ENCOUNTER — Encounter: Payer: Self-pay | Admitting: Internal Medicine

## 2020-12-13 ENCOUNTER — Other Ambulatory Visit: Payer: Self-pay

## 2020-12-13 DIAGNOSIS — K859 Acute pancreatitis without necrosis or infection, unspecified: Secondary | ICD-10-CM

## 2020-12-13 DIAGNOSIS — E872 Acidosis, unspecified: Secondary | ICD-10-CM

## 2020-12-13 DIAGNOSIS — I959 Hypotension, unspecified: Secondary | ICD-10-CM

## 2020-12-13 DIAGNOSIS — R1084 Generalized abdominal pain: Secondary | ICD-10-CM | POA: Diagnosis not present

## 2020-12-13 DIAGNOSIS — K529 Noninfective gastroenteritis and colitis, unspecified: Secondary | ICD-10-CM | POA: Diagnosis not present

## 2020-12-13 LAB — TRIGLYCERIDES: Triglycerides: 62 mg/dL (ref <150)

## 2020-12-13 LAB — CBC WITH DIFFERENTIAL/PLATELET
Abs Immature Granulocytes: 1.24 10*3/uL — ABNORMAL HIGH (ref 0.00–0.07)
Basophils Absolute: 0.2 10*3/uL — ABNORMAL HIGH (ref 0.0–0.1)
Basophils Relative: 0 %
Eosinophils Absolute: 0 10*3/uL (ref 0.0–0.5)
Eosinophils Relative: 0 %
HCT: 39.1 % (ref 36.0–46.0)
Hemoglobin: 12.1 g/dL (ref 12.0–15.0)
Immature Granulocytes: 2 %
Lymphocytes Relative: 1 %
Lymphs Abs: 0.4 10*3/uL — ABNORMAL LOW (ref 0.7–4.0)
MCH: 29.6 pg (ref 26.0–34.0)
MCHC: 30.9 g/dL (ref 30.0–36.0)
MCV: 95.6 fL (ref 80.0–100.0)
Monocytes Absolute: 1.7 10*3/uL — ABNORMAL HIGH (ref 0.1–1.0)
Monocytes Relative: 3 %
Neutro Abs: 48.8 10*3/uL — ABNORMAL HIGH (ref 1.7–7.7)
Neutrophils Relative %: 94 %
Platelets: 206 10*3/uL (ref 150–400)
RBC: 4.09 MIL/uL (ref 3.87–5.11)
RDW: 19.4 % — ABNORMAL HIGH (ref 11.5–15.5)
WBC: 52.3 10*3/uL (ref 4.0–10.5)
nRBC: 0.1 % (ref 0.0–0.2)

## 2020-12-13 LAB — COMPREHENSIVE METABOLIC PANEL
ALT: 17 U/L (ref 0–44)
AST: 24 U/L (ref 15–41)
Albumin: 1.6 g/dL — ABNORMAL LOW (ref 3.5–5.0)
Alkaline Phosphatase: 107 U/L (ref 38–126)
Anion gap: 15 (ref 5–15)
BUN: 34 mg/dL — ABNORMAL HIGH (ref 8–23)
CO2: 22 mmol/L (ref 22–32)
Calcium: 7.1 mg/dL — ABNORMAL LOW (ref 8.9–10.3)
Chloride: 93 mmol/L — ABNORMAL LOW (ref 98–111)
Creatinine, Ser: 7.95 mg/dL — ABNORMAL HIGH (ref 0.44–1.00)
GFR, Estimated: 5 mL/min — ABNORMAL LOW (ref >60)
Glucose, Bld: 216 mg/dL — ABNORMAL HIGH (ref 70–99)
Potassium: 3.2 mmol/L — ABNORMAL LOW (ref 3.5–5.1)
Sodium: 130 mmol/L — ABNORMAL LOW (ref 135–145)
Total Bilirubin: 0.5 mg/dL (ref 0.3–1.2)
Total Protein: 4.9 g/dL — ABNORMAL LOW (ref 6.5–8.1)

## 2020-12-13 LAB — CBC
HCT: 39.1 % (ref 36.0–46.0)
Hemoglobin: 12.1 g/dL (ref 12.0–15.0)
MCH: 29.4 pg (ref 26.0–34.0)
MCHC: 30.9 g/dL (ref 30.0–36.0)
MCV: 95.1 fL (ref 80.0–100.0)
Platelets: 206 10*3/uL (ref 150–400)
RBC: 4.11 MIL/uL (ref 3.87–5.11)
RDW: 19.5 % — ABNORMAL HIGH (ref 11.5–15.5)
WBC: 52.3 10*3/uL (ref 4.0–10.5)
nRBC: 0.1 % (ref 0.0–0.2)

## 2020-12-13 LAB — GASTROINTESTINAL PANEL BY PCR, STOOL (REPLACES STOOL CULTURE)

## 2020-12-13 LAB — C DIFFICILE (CDIFF) QUICK SCRN (NO PCR REFLEX)
C Diff antigen: POSITIVE — AB
C Diff toxin: NEGATIVE

## 2020-12-13 LAB — GLUCOSE, CAPILLARY
Glucose-Capillary: 161 mg/dL — ABNORMAL HIGH (ref 70–99)
Glucose-Capillary: 131 mg/dL — ABNORMAL HIGH (ref 70–99)
Glucose-Capillary: 138 mg/dL — ABNORMAL HIGH (ref 70–99)
Glucose-Capillary: 183 mg/dL — ABNORMAL HIGH (ref 70–99)

## 2020-12-13 LAB — LIPASE, BLOOD: Lipase: 924 U/L — ABNORMAL HIGH (ref 11–51)

## 2020-12-13 LAB — SARS CORONAVIRUS 2 (TAT 6-24 HRS): SARS Coronavirus 2: NEGATIVE

## 2020-12-13 LAB — CLOSTRIDIUM DIFFICILE BY PCR, REFLEXED: Toxigenic C. Difficile by PCR: NEGATIVE

## 2020-12-13 MED ORDER — GENTAMICIN SULFATE 0.1 % EX CREA
1.0000 "application " | TOPICAL_CREAM | Freq: Every day | CUTANEOUS | Status: DC
  Administered 2020-12-13 – 2020-12-19 (×6): 1 via TOPICAL
  Filled 2020-12-13: qty 15

## 2020-12-13 MED ORDER — HEPARIN 1000 UNIT/ML FOR PERITONEAL DIALYSIS
500.0000 [IU] | INTRAMUSCULAR | Status: DC | PRN
  Filled 2020-12-13: qty 0.5

## 2020-12-13 MED ORDER — LACTATED RINGERS IV SOLN: INTRAVENOUS | Status: DC

## 2020-12-13 MED ORDER — DELFLEX-LC/1.5% DEXTROSE 344 MOSM/L IP SOLN
INTRAPERITONEAL | Status: DC
  Filled 2020-12-13 (×5): qty 3000

## 2020-12-13 NOTE — Consult Note (Signed)
[facility] GI Inpatient Consult Note   Anita Wells, M.D.  Reason for Consult: Acute colitis, abdominal pain, abnormal CT of the colon.   Attending Requesting Consult: Loletha Grayer, M.D.  Outpatient Primary Physician: Dion Body, M.D.  History of Present Illness: Anita Wells is a 80 y.o. female is a patient with medical history significant ofESRD-peritoneal dialysis nightly, Barrett's esophagus, hypertension, hyperlipidemia, diabetes mellitus, GERD, depression, renal artery stenosis, anemia, bilateral carotid artery stenosis, diverticulitis, tobacco abuse, who presented yesterday to the ER  With recurrent  nausea, vomiting, diarrhea and abdominal pain. Patient was recently admitted on 12/05/20 and discharged on 12/08/20 for similar complaints. CT showed colitis and patient treated for presumed sepsis from colitis, improved with tolerating po diet. Patient was discharged on 2/14 po antibiotics. I spoke with the patient's daughter, Anita Wells, primarily given the patient's reluctance to speak. Anita Wells says the patient has had ongoing diarrhea without blood even since discharge on antibiotics on 12/08/20. Patient not eating well for the past 3 weeks with intermittent nausea and vomiting. NO reports of odynophagia or dysphagia.     Past Medical History:  Past Medical History:  Diagnosis Date  . Anemia   . Barrett's esophagus   . Carotid stenosis, bilateral   . Chronic kidney disease   . GERD (gastroesophageal reflux disease)   . Hyperlipidemia   . Hyperparathyroidism ([place])   . Hypertension   . Renal artery stenosis ([facility])    left    Problem List: Patient Active Problem List   Diagnosis Date Noted  . Colitis 12/12/2020  . Generalized abdominal pain   . Elevated lipase   . Chronic obstructive pulmonary disease ([place])   . Acute colitis 12/05/2020  . Type 2 diabetes mellitus with ESRD (end-stage renal disease) ([place]) 12/05/2020  . Sepsis ([place]) 12/05/2020  .  Hyponatremia 12/05/2020  . GERD (gastroesophageal reflux disease) 12/05/2020  . Depression 12/05/2020  . Tobacco abuse 12/05/2020  . Anemia in ESRD (end-stage renal disease) ([place]) 12/05/2020  . Hypomagnesemia 12/05/2020  . Type 2 diabetes mellitus with hyperosmolar hyperglycemic state (HHS) ([place]) 12/11/2019  . Fluid overload 12/11/2019  . Hypokalemia 12/11/2019  . Leukocytosis 12/11/2019  . ESRD on peritoneal dialysis ([place])   . Shortness of breath 07/27/2019  . ESRD on dialysis ([place]) 11/22/2018  . Essential hypertension 11/22/2018  . Hyperlipidemia 11/22/2018  . Anemia due to chronic kidney disease 11/12/2018    Past Surgical History: Past Surgical History:  Procedure Laterality Date  . A/V FISTULAGRAM Left 02/19/2019   Procedure: A/V FISTULAGRAM;  Surgeon: Algernon Huxley, MD;  Location: [facility];  Service: Cardiovascular;  Laterality: Left;  . AV FISTULA PLACEMENT  12/21/2018   Procedure: ARTERIOVENOUS (AV) FISTULA CREATION;  Surgeon: Algernon Huxley, MD;  Location: [facility];  Service: Vascular;;  . COLON RESECTION    . COLON SURGERY    . colonoscpy    . DIALYSIS/PERMA CATHETER INSERTION N/A 11/15/2018   Procedure: DIALYSIS/PERMA CATHETER INSERTION;  Surgeon: Algernon Huxley, MD;  Location: [facility];  Service: Cardiovascular;  Laterality: N/A;  . DIALYSIS/PERMA CATHETER REMOVAL N/A 09/27/2019   Procedure: DIALYSIS/PERMA CATHETER REMOVAL;  Surgeon: Algernon Huxley, MD;  Location: [place] [facility];  Service: Cardiovascular;  Laterality: N/A;  . RENAL ARTERY STENT Left     Allergies: Allergies  Allergen Reactions  . Clopidogrel Swelling    Mouth swelling  . Ace Inhibitors Cough  . Amlodipine Other (See Comments)    Peripheral edema (tolerates  low dose)    Home Medications: Medications Prior to Admission  Medication Sig Dispense Refill Last Dose  . amLODipine (NORVASC) 2.5 MG tablet Take 2.5 mg by mouth daily.   12/12/2020 at 0900  . amoxicillin-clavulanate  (AUGMENTIN) 500-125 MG tablet Take 1 tablet (500 mg total) by mouth daily for 5 days. 5 tablet 0 12/12/2020 at 0900  . aspirin 81 MG chewable tablet Chew 81 mg by mouth daily.   12/12/2020 at 0900  . atorvastatin (LIPITOR) 40 MG tablet Take 40 mg by mouth at bedtime.    12/11/2020 at 2000  . calcium acetate (PHOSLO) 667 MG capsule Take by mouth See admin instructions. Take 2 capsules by mouth with each meal and then 1 capsule with 2 snacks a day   12/12/2020 at 0900  . diphenhydrAMINE (BENADRYL) 25 MG tablet Take 25 mg by mouth at bedtime as needed.   12/11/2020 at 2000  . escitalopram (LEXAPRO) 10 MG tablet Take 10 mg by mouth daily.   12/12/2020 at 0900  . insulin glargine (LANTUS) 100 UNIT/ML injection Inject 0.05 mLs (5 Units total) into the skin daily. Pt advised to take insulin according to her sugar check 10 mL 11 12/11/2020 at Unknown time  . mirtazapine (REMERON) 7.5 MG tablet Take 7.5 mg by mouth at bedtime.   12/11/2020 at 2000  . multivitamin (RENA-VIT) TABS tablet Take 1 tablet by mouth daily.    12/12/2020 at 0900  . omeprazole (PRILOSEC) 40 MG capsule Take 40 mg by mouth daily.   12/12/2020 at 0900  . oxyCODONE-acetaminophen (PERCOCET/ROXICET) 5-325 MG tablet Take 1 tablet by mouth every 8 (eight) hours as needed for severe pain. 10 tablet 0 12/12/2020 at 0900  . Potassium Chloride ER 20 MEQ TBCR Take 1 tablet by mouth daily.   12/12/2020 at 0900  . albuterol (VENTOLIN HFA) 108 (90 Base) MCG/ACT inhaler Inhale into the lungs.   unknown at prn  . Fluticasone-Salmeterol (ADVAIR) 250-50 MCG/DOSE AEPB Inhale into the lungs.   unknown at prn  . Fluticasone-Umeclidin-Vilant (TRELEGY ELLIPTA) 100-62.5-25 MCG/INH AEPB Inhale into the lungs.   unknown at prn   Home medication reconciliation was completed with the patient.   Scheduled Inpatient Medications:   . amLODipine  2.5 mg Oral Daily  . aspirin  81 mg Oral Daily  . atorvastatin  40 mg Oral QHS  . calcium acetate  1,334 mg Oral TID WC  .  escitalopram  10 mg Oral Daily  . fluticasone furoate-vilanterol  1 puff Inhalation Daily   And  . umeclidinium bromide  1 puff Inhalation Daily  . gentamicin cream  1 application Topical Daily  . heparin  5,000 Units Subcutaneous Q8H  . insulin aspart  0-5 Units Subcutaneous QHS  . insulin aspart  0-6 Units Subcutaneous TID WC  . mirtazapine  7.5 mg Oral QHS  . multivitamin  1 tablet Oral Daily  . pantoprazole  40 mg Oral Daily    Continuous Inpatient Infusions:   . sodium chloride 250 mL (12/13/20 0622)  . cefTRIAXone (ROCEPHIN)  IV 2 g (12/12/20 2234)  . dialysis solution 1.5% low-MG/low-CA    . lactated ringers 75 mL/hr at 12/13/20 1220  . metronidazole 500 mg (12/13/20 [id number])    PRN Inpatient Medications:  sodium chloride, acetaminophen **OR** acetaminophen, albuterol, heparin, ondansetron **OR** ondansetron (ZOFRAN) IV, oxyCODONE-acetaminophen  Family History: family history includes Alcohol abuse in her father; Diabetes Mellitus II in her mother; Hypertension in her mother.   GI Family History: Negative  Social History:   reports that she has been smoking cigarettes. She has a 12.50 pack-year smoking history. She has never used smokeless tobacco. She reports previous alcohol use. She reports that she does not use drugs. The patient denies ETOH, tobacco, or drug use.    Review of Systems: Review of Systems - History obtained from daughter. Negative except for HPI. Patient reluctant to engage with examiner. Physical Examination: BP 99/62 (BP Location: Right Arm)   Pulse 100   Temp (!) 97.3 F (36.3 C) (Oral)   Resp 18   Ht '5\' 6"'$  (1.676 m)   Wt 54.4 kg   SpO2 96%   BMI 19.37 kg/m  Physical Exam Constitutional:      General: She is not in acute distress.    Appearance: She is ill-appearing. She is not toxic-appearing or diaphoretic.  HENT:     Head: Normocephalic and atraumatic.  Eyes:     Pupils: Pupils are equal, round, and reactive to light.  Cardiovascular:      Rate and Rhythm: Normal rate.     Heart sounds: Normal heart sounds. No gallop.   Pulmonary:     Effort: Pulmonary effort is normal. No respiratory distress.     Breath sounds: Normal breath sounds. No stridor. No wheezing.  Abdominal:     General: Bowel sounds are normal. There is distension.     Palpations: There is no mass.     Tenderness: There is generalized abdominal tenderness. There is no guarding or rebound. Negative signs include Murphy's sign.     Hernia: No hernia is present.  Skin:    General: Skin is warm and dry.  Neurological:     Mental Status: She is alert.     GCS: GCS eye subscore is 3. GCS verbal subscore is 2. GCS motor subscore is 2.     Data: Lab Results  Component Value Date   WBC 52.3 (HH) 12/13/2020   HGB 12.1 12/13/2020   HCT 39.1 12/13/2020   MCV 95.6 12/13/2020   PLT 206 12/13/2020   Recent Labs  Lab 12/12/20 1241 12/13/20 0427 12/13/20 0428  HGB 14.4 12.1 12.1   Lab Results  Component Value Date   NA 130 (L) 12/13/2020   K 3.2 (L) 12/13/2020   CL 93 (L) 12/13/2020   CO2 22 12/13/2020   BUN 34 (H) 12/13/2020   CREATININE 7.95 (H) 12/13/2020   Lab Results  Component Value Date   ALT 17 12/13/2020   AST 24 12/13/2020   ALKPHOS 107 12/13/2020   BILITOT 0.5 12/13/2020   No results for input(s): APTT, INR, PTT in the last 168 hours. CBC Latest Ref Rng & Units 12/13/2020 12/13/2020 12/12/2020  WBC 4.0 - 10.5 K/uL 52.3(HH) 52.3(HH) 29.8(H)  Hemoglobin 12.0 - 15.0 g/dL 12.1 12.1 14.4  Hematocrit 36.0 - 46.0 % 39.1 39.1 46.0  Platelets 150 - 400 K/uL 206 206 293    STUDIES: CT Abdomen Pelvis Wo Contrast  Result Date: 12/12/2020 CLINICAL DATA:  Colitis, sepsis, worsening symptoms, weaker; history hypertension, end-stage renal disease on dialysis, type II diabetes mellitus EXAM: CT ABDOMEN AND PELVIS WITHOUT CONTRAST TECHNIQUE: Multidetector CT imaging of the abdomen and pelvis was performed following the standard protocol without IV  contrast. Sagittal and coronal MPR images reconstructed from axial data set. No oral contrast administered. COMPARISON:  12/05/2020 FINDINGS: Lower chest: Lung bases clear Hepatobiliary: Gallbladder surgically absent. No focal hepatic masses. Mild intrahepatic and extrahepatic biliary dilatation. Pancreas: Normal appearance Spleen: Normal appearance  Adrenals/Urinary Tract: Bladder decompressed. Adrenal gland thickening without discrete mass. Small BILATERAL renal cysts, suboptimally characterized. Additional hyperdense lesion at inferior pole LEFT kidney 10 mm diameter unchanged. Stomach/Bowel: Short segment of normal appendix visualized. Diffuse colonic wall thickening involving the ascending and descending colon consistent with colitis. Distal sigmoid anastomotic staple line. Tiny hiatal hernia. Stomach otherwise unremarkable. Suboptimal assessment of small-bowel wall thickness due to lack of IV and oral contrast but question diffusely thickened Vascular/Lymphatic: Extensive atherosclerotic calcifications involving coronary arteries, aorta, iliac arteries, visceral arteries. Dilatation of distal abdominal aorta 2.6 cm diameter versus 1.5 cm diameter proximal diameter. Displaced intimal calcifications consistent with chronic dissection. Reproductive: Atrophic uterus.  Unremarkable adnexa Other: Scattered ascites throughout abdomen and pelvis. Peritoneal dialysis catheter present. No free air. No hernia. Musculoskeletal: Degenerative disc disease changes L4-L5. IMPRESSION: Scattered colonic wall thickening involving the ascending and descending colon as well as splenic flexure and questionably small bowel loops; this could reflect inflammatory bowel disease or infection, ischemia considered less likely but not entirely excluded. Little change in bowel wall thickening since previous study. Peritoneal dialysis catheter with scattered ascites. Mild intrahepatic and extrahepatic biliary dilatation, likely related to  prior cholecystectomy, recommend correlation with LFTs. Tiny hiatal hernia. 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 This recommendation follows ACR consensus guidelines: White Paper of the ACR Incidental Findings Committee II on Vascular Findings. J Am Coll Radiol 2013; 10:789-794. Aortic Atherosclerosis (ICD10-I70.0). Electronically Signed   By: Lavonia Dana M.D.   On: 12/12/2020 15:05   DG Chest Portable 1 View  Result Date: 12/12/2020 CLINICAL DATA:  80 year old female with nausea and vomiting. EXAM: PORTABLE CHEST 1 VIEW COMPARISON:  Chest radiograph dated 12/11/2019 FINDINGS: Background of emphysema. There is minimal eventration of the left hemidiaphragm with probable minimal left lung base atelectasis. Overall interval improvement in aeration of the lungs compared to prior radiograph. No focal consolidation, pleural effusion or pneumothorax. The cardiac silhouette is within limits. Atherosclerotic calcification of the aorta. No acute osseous pathology. IMPRESSION: 1. No acute cardiopulmonary process. 2. Emphysema. Electronically Signed   By: Anner Crete M.D.   On: 12/12/2020 16:45   '@IMAGES'$ @  Assessment: 1. Recurrent Nausea and vomiting - DDx includes infection (had recent UTI), PUD with or without gastric outlet obstruction, CNS lesion, drug side effect, malignancy, gastroparesis, etc. 2. Diarrhea - DDx includes infectious colitis (essentially ruled out), drug effect, inflammatory bowel disease, food intolerance, small bowel disease (bacterial overgrowth), celiac disease (less likely due to race and acute setting), ingestion of nonabsorbed solute (osmotic diarrhea), endocrine (diabetes, thyroid disease). Postobstructive diarrhea possible from partial bowel obstruction in the form of  stool impaction, malignancy, etc. 3. Urinary tract infection. 4. ESRD on peritoneal dialysis. 5. Abdominal pain - Peritoneal fluid negative for spontaneous or secondary bacterial peritonitis. 6. Marked leukocytosis with left shift - Abundant neutrophils consistent with infection, but in addition there are many immature wbc forms - concerning for  possible myeloproliferative, myelofibrosis disease. Pathologists' reading of blood smear pending. May need hematology consult.  COVID-19 status: Tested negative  Recommendations: 1. Continue diet as tolerated. 2. Agree with zofran and pantoprazole as ordered. 3. Consider hematology consult. 4. EGD and flexible sigmoidoscopy when clinically feasible. Discussed with daughters who agree, but patient still undecided. Will continue to engage patient and ask questions to understand her wishes for her care.  5. Following. Case discussed with Dr. Leslye Peer.  Thank you for the consult. Please call with questions or concerns.  Olean Ree, "Lanny Hurst MD [facility] [place], [place] [address] [phone]  12/13/2020 2:22 PM

## 2020-12-13 NOTE — Progress Notes (Signed)
Blood sugar level 161.

## 2020-12-13 NOTE — Progress Notes (Signed)
[place], [place] 12/13/20  Subjective:   Anita Wells is a 80 y.o. African American female with PMHX of anermia, barett's esophagus, carotid stenosis, CKD, GERD, HTN and Hyperlipidemia. She presents to the ED with abdominal pain. She had a recent hospitalization for colitis. She was prescribed antibiotics and discharged. She continues to have nausea, vomiting and diarrhea. No blood reported. CT scan shows colitis.  She says she has had this pain for 2 weeks. It only improves with pain medication. She has been able to continue her PD treatments.   Admitted for colitis with leukocytosis and lactic acidosis  Last PD was 12/12/20  Objective:  Vital signs in last 24 hours:  Temp:  [97.3 F (36.3 C)-98 F (36.7 C)] 97.3 F (36.3 C) (02/19 0817) Pulse Rate:  [63-100] 100 (02/19 1238) Resp:  [11-20] 18 (02/19 1238) BP: (88-160)/(53-87) 99/62 (02/19 1238) SpO2:  [96 %-100 %] 96 % (02/19 1238)  Weight change:  Filed Weights   12/12/20 1220  Weight: 54.4 kg    Intake/Output:   No intake or output data in the 24 hours ending 12/13/20 1337   Physical Exam: General: NAD, ill appearing  HEENT Normocephalic, dry oral mucosa and lips  Pulm/lungs Clear bilaterally  CVS/Heart S1-S2 present  Abdomen:  soft  Extremities: Minimal peripheral edema  Neurologic: Alert, oriented  Skin: Dry, no lesions  Access: PD catheter       Basic Metabolic Panel:  Recent Labs  Lab 12/07/20 0915 12/12/20 1350 12/13/20 0427  NA 128* 130* 130*  K 3.4* 3.4* 3.2*  CL 91* 89* 93*  CO2 24 20* 22  GLUCOSE 43* 136* 216*  BUN 45* 33* 34*  CREATININE 9.33* 9.12* 7.95*  CALCIUM 6.6* 7.7* 7.1*  MG  --  1.6*  --      CBC: Recent Labs  Lab 12/12/20 1241 12/13/20 0427 12/13/20 0428  WBC 29.8* 52.3* 52.3*  NEUTROABS  --   --  48.8*  HGB 14.4 12.1 12.1  HCT 46.0 39.1 39.1  MCV 94.8 95.1 95.6  PLT 293 206 206     No results found for: HEPBSAG, HEPBSAB,  HEPBIGM    Microbiology:  Recent Results (from the past 240 hour(s))  C Difficile Quick Screen w PCR reflex     Status: Abnormal   Collection Time: 12/05/20  9:28 AM   Specimen: Stool  Result Value Ref Range Status   C Diff antigen POSITIVE (A) NEGATIVE Final   C Diff toxin NEGATIVE NEGATIVE Final   C Diff interpretation Results are indeterminate. See PCR results.  Final    Comment: Performed at [facility], [address] [place], [place] [address]  C. Diff by PCR, Reflexed     Status: None   Collection Time: 12/05/20  9:28 AM  Result Value Ref Range Status   Toxigenic C. Difficile by PCR NEGATIVE NEGATIVE Final    Comment: Patient is colonized with non toxigenic C. difficile. May not need treatment unless significant symptoms are present. Performed at [facility], [place], [place] [address]   SARS CORONAVIRUS 2 (TAT 6-24 HRS) Nasopharyngeal Nasopharyngeal Swab     Status: None   Collection Time: 12/05/20 10:40 AM   Specimen: Nasopharyngeal Swab  Result Value Ref Range Status   SARS Coronavirus 2 NEGATIVE NEGATIVE Final    Comment: (NOTE) SARS-CoV-2 target nucleic acids are NOT DETECTED.  The SARS-CoV-2 RNA is generally detectable in upper and lower respiratory specimens during the acute  phase of infection. Negative results do not preclude SARS-CoV-2 infection, do not rule out co-infections with other pathogens, and should not be used as the sole basis for treatment or other patient management decisions. Negative results must be combined with clinical observations, patient history, and epidemiological information. The expected result is Negative.  Fact Sheet for Patients: SugarRoll.be  Fact Sheet for Healthcare Providers: https://www.woods-mathews.com/  This test is not yet approved or cleared by the [place] FDA and  has been authorized for detection and/or diagnosis of SARS-CoV-2  by FDA under an Emergency Use Authorization (EUA). This EUA will remain  in effect (meaning this test can be used) for the duration of the COVID-19 declaration under Se ction 564(b)(1) of the Act, 21 U.S.C. section 360bbb-3(b)(1), unless the authorization is terminated or revoked sooner.  Performed at [facility], [place] [address] [place], [place] [address]   Culture, blood (Routine X 2) w Reflex to ID Panel     Status: None   Collection Time: 12/05/20 11:30 AM   Specimen: BLOOD  Result Value Ref Range Status   Specimen Description BLOOD RIGHT ANTECUBITAL  Final   Special Requests   Final    BOTTLES DRAWN AEROBIC AND ANAEROBIC Blood Culture results may not be optimal due to an inadequate volume of blood received in culture bottles   Culture   Final    NO GROWTH 5 DAYS Performed at [facility], [address] [place], [place] [address]    Report Status 12/10/2020 FINAL  Final  Culture, blood (Routine X 2) w Reflex to ID Panel     Status: None   Collection Time: 12/05/20 11:30 AM   Specimen: BLOOD  Result Value Ref Range Status   Specimen Description BLOOD BLOOD RIGHT WRIST  Final   Special Requests   Final    BOTTLES DRAWN AEROBIC AND ANAEROBIC Blood Culture results may not be optimal due to an inadequate volume of blood received in culture bottles   Culture   Final    NO GROWTH 5 DAYS Performed at [facility], [address] [place],  [address]    Report Status 12/10/2020 FINAL  Final  Gastrointestinal Panel by PCR , Stool     Status: None   Collection Time: 12/05/20  4:40 PM   Specimen: Stool  Result Value Ref Range Status   Campylobacter species NOT DETECTED NOT DETECTED Final   Plesimonas shigelloides NOT DETECTED NOT DETECTED Final   Salmonella species NOT DETECTED NOT DETECTED Final   Yersinia enterocolitica NOT DETECTED NOT DETECTED Final   Vibrio species NOT DETECTED NOT DETECTED Final   Vibrio cholerae NOT DETECTED NOT DETECTED  Final   Enteroaggregative E coli (EAEC) NOT DETECTED NOT DETECTED Final   Enteropathogenic E coli (EPEC) NOT DETECTED NOT DETECTED Final   Enterotoxigenic E coli (ETEC) NOT DETECTED NOT DETECTED Final   Shiga like toxin producing E coli (STEC) NOT DETECTED NOT DETECTED Final   Shigella/Enteroinvasive E coli (EIEC) NOT DETECTED NOT DETECTED Final   Cryptosporidium NOT DETECTED NOT DETECTED Final   Cyclospora cayetanensis NOT DETECTED NOT DETECTED Final   Entamoeba histolytica NOT DETECTED NOT DETECTED Final   Giardia lamblia NOT DETECTED NOT DETECTED Final   Adenovirus F40/41 NOT DETECTED NOT DETECTED Final   Astrovirus NOT DETECTED NOT DETECTED Final   Norovirus GI/GII NOT DETECTED NOT DETECTED Final   Rotavirus A NOT DETECTED NOT DETECTED Final   Sapovirus (I, II, IV, and V) NOT DETECTED NOT DETECTED  Final    Comment: Performed at [facility], [address] [place], [place] [address]  Culture, blood (routine x 2)     Status: None (Preliminary result)   Collection Time: 12/12/20  1:50 PM   Specimen: BLOOD  Result Value Ref Range Status   Specimen Description BLOOD BLOOD RIGHT ARM  Final   Special Requests   Final    BOTTLES DRAWN AEROBIC AND ANAEROBIC Blood Culture adequate volume   Culture   Final    NO GROWTH < 24 HOURS Performed at [facility], [place], [place] [address]    Report Status PENDING  Incomplete  SARS CORONAVIRUS 2 (TAT 6-24 HRS) Nasopharyngeal Nasopharyngeal Swab     Status: None   Collection Time: 12/12/20  2:30 PM   Specimen: Nasopharyngeal Swab  Result Value Ref Range Status   SARS Coronavirus 2 NEGATIVE NEGATIVE Final    Comment: (NOTE) SARS-CoV-2 target nucleic acids are NOT DETECTED.  The SARS-CoV-2 RNA is generally detectable in upper and lower respiratory specimens during the acute phase of infection. Negative results do not preclude SARS-CoV-2 infection, do not rule out co-infections with other pathogens, and  should not be used as the sole basis for treatment or other patient management decisions. Negative results must be combined with clinical observations, patient history, and epidemiological information. The expected result is Negative.  Fact Sheet for Patients: SugarRoll.be  Fact Sheet for Healthcare Providers: https://www.woods-mathews.com/  This test is not yet approved or cleared by the [place] FDA and  has been authorized for detection and/or diagnosis of SARS-CoV-2 by FDA under an Emergency Use Authorization (EUA). This EUA will remain  in effect (meaning this test can be used) for the duration of the COVID-19 declaration under Se ction 564(b)(1) of the Act, 21 U.S.C. section 360bbb-3(b)(1), unless the authorization is terminated or revoked sooner.  Performed at [facility], [place] [address], [place], [place] [address]   Culture, blood (routine x 2)     Status: None (Preliminary result)   Collection Time: 12/12/20  2:56 PM   Specimen: BLOOD  Result Value Ref Range Status   Specimen Description BLOOD BLOOD RIGHT FOREARM  Final   Special Requests   Final    BOTTLES DRAWN AEROBIC AND ANAEROBIC Blood Culture adequate volume   Culture   Final    NO GROWTH < 24 HOURS Performed at [facility], [address] [place], [place] [address]    Report Status PENDING  Incomplete  Gastrointestinal Panel by PCR , Stool     Status: Abnormal   Collection Time: 12/13/20  9:40 AM   Specimen: Stool  Result Value Ref Range Status   Campylobacter species NOT DETECTED NOT DETECTED Final   Plesimonas shigelloides NOT DETECTED NOT DETECTED Final   Salmonella species NOT DETECTED NOT DETECTED Final   Yersinia enterocolitica NOT DETECTED NOT DETECTED Final   Vibrio species NOT DETECTED NOT DETECTED Final   Vibrio cholerae NOT DETECTED NOT DETECTED Final   Enteroaggregative E coli (EAEC) NOT DETECTED NOT DETECTED Final    Enteropathogenic E coli (EPEC) NOT DETECTED NOT DETECTED Final   Enterotoxigenic E coli (ETEC) NOT DETECTED NOT DETECTED Final   Shiga like toxin producing E coli (STEC) NOT DETECTED NOT DETECTED Final   Shigella/Enteroinvasive E coli (EIEC) NOT DETECTED NOT DETECTED Final   Cryptosporidium NOT DETECTED NOT DETECTED Final   Cyclospora cayetanensis NOT DETECTED NOT DETECTED Final   Entamoeba histolytica NOT DETECTED NOT DETECTED  Final   Giardia lamblia NOT DETECTED NOT DETECTED Final   Adenovirus F40/41 DETECTED (A) NOT DETECTED Final   Astrovirus NOT DETECTED NOT DETECTED Final   Norovirus GI/GII NOT DETECTED NOT DETECTED Final   Rotavirus A NOT DETECTED NOT DETECTED Final   Sapovirus (I, II, IV, and V) NOT DETECTED NOT DETECTED Final    Comment: Performed at [facility], [address] [place], [place] [address]  C Difficile Quick Screen (NO PCR Reflex)     Status: Abnormal   Collection Time: 12/13/20  9:40 AM   Specimen: STOOL  Result Value Ref Range Status   C Diff antigen POSITIVE (A) NEGATIVE Final   C Diff toxin NEGATIVE NEGATIVE Final   C Diff interpretation   Final    Results are indeterminate. Please contact the provider listed for your campus for C diff questions in [place].    Comment: Performed at [facility], [address] [place],  [address]  C. Diff by PCR, Reflexed     Status: None   Collection Time: 12/13/20  9:40 AM  Result Value Ref Range Status   Toxigenic C. Difficile by PCR NEGATIVE NEGATIVE Final    Comment: Patient is colonized with non toxigenic C. difficile. May not need treatment unless significant symptoms are present. Performed at [facility], [address] [place],  [address]     Coagulation Studies: No results for input(s): LABPROT, INR in the last 72 hours.  Urinalysis: No results for input(s): COLORURINE, LABSPEC, PHURINE, GLUCOSEU, HGBUR, BILIRUBINUR, KETONESUR, PROTEINUR, UROBILINOGEN, NITRITE,  LEUKOCYTESUR in the last 72 hours.  Invalid input(s): APPERANCEUR    Imaging: CT Abdomen Pelvis Wo Contrast  Result Date: 12/12/2020 CLINICAL DATA:  Colitis, sepsis, worsening symptoms, weaker; history hypertension, end-stage renal disease on dialysis, type II diabetes mellitus EXAM: CT ABDOMEN AND PELVIS WITHOUT CONTRAST TECHNIQUE: Multidetector CT imaging of the abdomen and pelvis was performed following the standard protocol without IV contrast. Sagittal and coronal MPR images reconstructed from axial data set. No oral contrast administered. COMPARISON:  12/05/2020 FINDINGS: Lower chest: Lung bases clear Hepatobiliary: Gallbladder surgically absent. No focal hepatic masses. Mild intrahepatic and extrahepatic biliary dilatation. Pancreas: Normal appearance Spleen: Normal appearance Adrenals/Urinary Tract: Bladder decompressed. Adrenal gland thickening without discrete mass. Small BILATERAL renal cysts, suboptimally characterized. Additional hyperdense lesion at inferior pole LEFT kidney 10 mm diameter unchanged. Stomach/Bowel: Short segment of normal appendix visualized. Diffuse colonic wall thickening involving the ascending and descending colon consistent with colitis. Distal sigmoid anastomotic staple line. Tiny hiatal hernia. Stomach otherwise unremarkable. Suboptimal assessment of small-bowel wall thickness due to lack of IV and oral contrast but question diffusely thickened Vascular/Lymphatic: Extensive atherosclerotic calcifications involving coronary arteries, aorta, iliac arteries, visceral arteries. Dilatation of distal abdominal aorta 2.6 cm diameter versus 1.5 cm diameter proximal diameter. Displaced intimal calcifications consistent with chronic dissection. Reproductive: Atrophic uterus.  Unremarkable adnexa Other: Scattered ascites throughout abdomen and pelvis. Peritoneal dialysis catheter present. No free air. No hernia. Musculoskeletal: Degenerative disc disease changes L4-L5. IMPRESSION:  Scattered colonic wall thickening involving the ascending and descending colon as well as splenic flexure and questionably small bowel loops; this could reflect inflammatory bowel disease or infection, ischemia considered less likely but not entirely excluded. Little change in bowel wall thickening since previous study. Peritoneal dialysis catheter with scattered ascites. Mild intrahepatic and extrahepatic biliary dilatation, likely related to prior cholecystectomy, recommend correlation with LFTs. Tiny hiatal hernia. 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 This recommendation follows ACR consensus guidelines: White Paper of the ACR Incidental Findings Committee II on Vascular Findings. J Am Coll Radiol 2013; 10:789-794. Aortic Atherosclerosis (ICD10-I70.0). Electronically Signed   By: Lavonia Dana M.D.   On: 12/12/2020 15:05   DG Chest Portable 1 View  Result Date: 12/12/2020 CLINICAL DATA:  80 year old female with nausea and vomiting. EXAM: PORTABLE CHEST 1 VIEW COMPARISON:  Chest radiograph dated 12/11/2019 FINDINGS: Background of emphysema. There is minimal eventration of the left hemidiaphragm with probable minimal left lung base atelectasis. Overall interval improvement in aeration of the lungs compared to prior radiograph. No focal consolidation, pleural effusion or pneumothorax. The cardiac silhouette is within limits. Atherosclerotic calcification of the aorta. No acute osseous pathology. IMPRESSION: 1. No acute cardiopulmonary process. 2. Emphysema. Electronically Signed   By: Anner Crete M.D.   On: 12/12/2020 16:45     Medications:   . sodium chloride 250 mL (12/13/20 0622)  . cefTRIAXone (ROCEPHIN)  IV 2 g (12/12/20 2234)  . dialysis solution 1.5% low-MG/low-CA    . lactated  ringers 75 mL/hr at 12/13/20 1220  . metronidazole 500 mg (12/13/20 [id number])   . amLODipine  2.5 mg Oral Daily  . aspirin  81 mg Oral Daily  . atorvastatin  40 mg Oral QHS  . calcium acetate  1,334 mg Oral TID WC  . escitalopram  10 mg Oral Daily  . fluticasone furoate-vilanterol  1 puff Inhalation Daily   And  . umeclidinium bromide  1 puff Inhalation Daily  . gentamicin cream  1 application Topical Daily  . heparin  5,000 Units Subcutaneous Q8H  . insulin aspart  0-5 Units Subcutaneous QHS  . insulin aspart  0-6 Units Subcutaneous TID WC  . mirtazapine  7.5 mg Oral QHS  . multivitamin  1 tablet Oral Daily  . pantoprazole  40 mg Oral Daily   sodium chloride, acetaminophen **OR** acetaminophen, albuterol, heparin, ondansetron **OR** ondansetron (ZOFRAN) IV, oxyCODONE-acetaminophen  Assessment/ Plan:  80 y.o. female with PMHX of anermia, barett's esophagus, carotid stenosis, CKD, GERD, HTN and Hyperlipidemia. was admitted on 12/12/2020 for Colitis [K52.9]  Active Problems:   Type 2 diabetes mellitus with ESRD (end-stage renal disease) ([place])   Colitis  Colitis [K52.9] Generalized abdominal pain [R10.84]  #. End Stage Renal Disease on Peritoneal Dialysis -CCPD 10 hours, 4 exchanges, 2000 fills -Continue home regimen PD nightly -Next treatment tonight -Start lactated ringers '@75'$  ml/hr for hydration -Will monitor fluid and labs to prevent fluid overload   #. Anemia of Chronic kidney disease  Lab Results  Component Value Date   HGB 12.1 12/13/2020   -will monitor for changes  #. Secondary hyperparathyroidism of renal origin     Component Value Date/Time   PTH 181 (H) 12/12/2019 1505   Lab Results  Component Value Date   PHOS 3.1 12/12/2019  Calcium acetate ordered with meals Monitor calcium and phos level during this admission   #. Diabetes type 2 with CKD Hgb A1c MFr Bld (%)  Date Value  12/12/2019 13.0 (H)  Glucose levels controlled at this time   LOS:  [place] 2/19/20221:37 PM  [place], [place]

## 2020-12-13 NOTE — Progress Notes (Signed)
Patient ID: Anita Wells, female   DOB: 11-14-1940, 80 y.o.   MRN: [id number] [facility] PROGRESS NOTE  RICKEYA HEMMERT [id number] DOB: 11/14/1940 DOA: 12/12/2020 PCP: Dion Body, MD  HPI/Subjective: Patient feeling about the same as yesterday.  Having abdominal pain graded 8 out of 10 in intensity.  When I saw her this morning had not had a bowel movement yet but had 1 today.  No nausea or vomiting.  Objective: Vitals:   12/13/20 0817 12/13/20 1238  BP: (!) 88/53 99/62  Pulse: 63 100  Resp: 18 18  Temp: (!) 97.3 F (36.3 C)   SpO2: 96% 96%   No intake or output data in the 24 hours ending 12/13/20 1523 Filed Weights   12/12/20 1220  Weight: 54.4 kg    ROS: Review of Systems  Respiratory: Negative for shortness of breath.   Cardiovascular: Negative for chest pain.  Gastrointestinal: Positive for abdominal pain. Negative for diarrhea, nausea and vomiting.   Exam: Physical Exam HENT:     Head: Normocephalic.     Mouth/Throat:     Pharynx: No oropharyngeal exudate.  Eyes:     General: Lids are normal.     Conjunctiva/sclera: Conjunctivae normal.     Pupils: Pupils are equal, round, and reactive to light.  Cardiovascular:     Rate and Rhythm: Normal rate and regular rhythm.     Heart sounds: Normal heart sounds, S1 normal and S2 normal.  Pulmonary:     Breath sounds: Normal breath sounds. No decreased breath sounds, wheezing, rhonchi or rales.  Abdominal:     Palpations: Abdomen is soft.     Tenderness: There is generalized abdominal tenderness and tenderness in the epigastric area.  Musculoskeletal:     Right lower leg: No swelling.     Left lower leg: No swelling.  Skin:    General: Skin is warm.     Findings: No rash.  Neurological:     Mental Status: She is alert and oriented to person, place, and time.       Data Reviewed: Basic Metabolic Panel: Recent Labs  Lab 12/07/20 0915 12/12/20 1350 12/13/20 0427  NA 128* 130* 130*  K 3.4* 3.4*  3.2*  CL 91* 89* 93*  CO2 24 20* 22  GLUCOSE 43* 136* 216*  BUN 45* 33* 34*  CREATININE 9.33* 9.12* 7.95*  CALCIUM 6.6* 7.7* 7.1*  MG  --  1.6*  --    Liver Function Tests: Recent Labs  Lab 12/12/20 1350 12/13/20 0427  AST 29 24  ALT 20 17  ALKPHOS 128* 107  BILITOT 0.4 0.5  PROT 5.3* 4.9*  ALBUMIN 2.0* 1.6*   Recent Labs  Lab 12/12/20 1350 12/13/20 0427  LIPASE 836* 924*   CBC: Recent Labs  Lab 12/12/20 1241 12/13/20 0427 12/13/20 0428  WBC 29.8* 52.3* 52.3*  NEUTROABS  --   --  48.8*  HGB 14.4 12.1 12.1  HCT 46.0 39.1 39.1  MCV 94.8 95.1 95.6  PLT 293 206 206    CBG: Recent Labs  Lab 12/07/20 2138 12/08/20 0305 12/08/20 0758 12/12/20 2242 12/13/20 1234  GLUCAP 213* 99 98 182* 131*    Recent Results (from the past 240 hour(s))  C Difficile Quick Screen w PCR reflex     Status: Abnormal   Collection Time: 12/05/20  9:28 AM   Specimen: Stool  Result Value Ref Range Status   C Diff antigen POSITIVE (A) NEGATIVE Final   C Diff toxin NEGATIVE  NEGATIVE Final   C Diff interpretation Results are indeterminate. See PCR results.  Final    Comment: Performed at [facility], [address] [place], [place] [address]  C. Diff by PCR, Reflexed     Status: None   Collection Time: 12/05/20  9:28 AM  Result Value Ref Range Status   Toxigenic C. Difficile by PCR NEGATIVE NEGATIVE Final    Comment: Patient is colonized with non toxigenic C. difficile. May not need treatment unless significant symptoms are present. Performed at [facility], [place], [place] [address]   SARS CORONAVIRUS 2 (TAT 6-24 HRS) Nasopharyngeal Nasopharyngeal Swab     Status: None   Collection Time: 12/05/20 10:40 AM   Specimen: Nasopharyngeal Swab  Result Value Ref Range Status   SARS Coronavirus 2 NEGATIVE NEGATIVE Final    Comment: (NOTE) SARS-CoV-2 target nucleic acids are NOT DETECTED.  The SARS-CoV-2 RNA is generally detectable in upper and  lower respiratory specimens during the acute phase of infection. Negative results do not preclude SARS-CoV-2 infection, do not rule out co-infections with other pathogens, and should not be used as the sole basis for treatment or other patient management decisions. Negative results must be combined with clinical observations, patient history, and epidemiological information. The expected result is Negative.  Fact Sheet for Patients: SugarRoll.be  Fact Sheet for Healthcare Providers: https://www.woods-mathews.com/  This test is not yet approved or cleared by the [place] FDA and  has been authorized for detection and/or diagnosis of SARS-CoV-2 by FDA under an Emergency Use Authorization (EUA). This EUA will remain  in effect (meaning this test can be used) for the duration of the COVID-19 declaration under Se ction 564(b)(1) of the Act, 21 U.S.C. section 360bbb-3(b)(1), unless the authorization is terminated or revoked sooner.  Performed at [facility], [place] [address], [place], [place] [address]   Culture, blood (Routine X 2) w Reflex to ID Panel     Status: None   Collection Time: 12/05/20 11:30 AM   Specimen: BLOOD  Result Value Ref Range Status   Specimen Description BLOOD RIGHT ANTECUBITAL  Final   Special Requests   Final    BOTTLES DRAWN AEROBIC AND ANAEROBIC Blood Culture results may not be optimal due to an inadequate volume of blood received in culture bottles   Culture   Final    NO GROWTH 5 DAYS Performed at [facility], [address] [place], [place] [address]    Report Status 12/10/2020 FINAL  Final  Culture, blood (Routine X 2) w Reflex to ID Panel     Status: None   Collection Time: 12/05/20 11:30 AM   Specimen: BLOOD  Result Value Ref Range Status   Specimen Description BLOOD BLOOD RIGHT WRIST  Final   Special Requests   Final    BOTTLES DRAWN AEROBIC AND ANAEROBIC Blood Culture results may not  be optimal due to an inadequate volume of blood received in culture bottles   Culture   Final    NO GROWTH 5 DAYS Performed at [facility], [address], [place], [place] [address]    Report Status 12/10/2020 FINAL  Final  Gastrointestinal Panel by PCR , Stool     Status: None   Collection Time: 12/05/20  4:40 PM   Specimen: Stool  Result Value Ref Range Status   Campylobacter species NOT DETECTED NOT DETECTED Final   Plesimonas shigelloides NOT DETECTED NOT DETECTED Final   Salmonella species NOT  DETECTED NOT DETECTED Final   Yersinia enterocolitica NOT DETECTED NOT DETECTED Final   Vibrio species NOT DETECTED NOT DETECTED Final   Vibrio cholerae NOT DETECTED NOT DETECTED Final   Enteroaggregative E coli (EAEC) NOT DETECTED NOT DETECTED Final   Enteropathogenic E coli (EPEC) NOT DETECTED NOT DETECTED Final   Enterotoxigenic E coli (ETEC) NOT DETECTED NOT DETECTED Final   Shiga like toxin producing E coli (STEC) NOT DETECTED NOT DETECTED Final   Shigella/Enteroinvasive E coli (EIEC) NOT DETECTED NOT DETECTED Final   Cryptosporidium NOT DETECTED NOT DETECTED Final   Cyclospora cayetanensis NOT DETECTED NOT DETECTED Final   Entamoeba histolytica NOT DETECTED NOT DETECTED Final   Giardia lamblia NOT DETECTED NOT DETECTED Final   Adenovirus F40/41 NOT DETECTED NOT DETECTED Final   Astrovirus NOT DETECTED NOT DETECTED Final   Norovirus GI/GII NOT DETECTED NOT DETECTED Final   Rotavirus A NOT DETECTED NOT DETECTED Final   Sapovirus (I, II, IV, and V) NOT DETECTED NOT DETECTED Final    Comment: Performed at [facility], [address] [place], [place] [address]  Culture, blood (routine x 2)     Status: None (Preliminary result)   Collection Time: 12/12/20  1:50 PM   Specimen: BLOOD  Result Value Ref Range Status   Specimen Description BLOOD BLOOD RIGHT ARM  Final   Special Requests   Final    BOTTLES DRAWN AEROBIC AND ANAEROBIC Blood Culture adequate volume    Culture   Final    NO GROWTH < 24 HOURS Performed at [facility], [place], [place] [address]    Report Status PENDING  Incomplete  SARS CORONAVIRUS 2 (TAT 6-24 HRS) Nasopharyngeal Nasopharyngeal Swab     Status: None   Collection Time: 12/12/20  2:30 PM   Specimen: Nasopharyngeal Swab  Result Value Ref Range Status   SARS Coronavirus 2 NEGATIVE NEGATIVE Final    Comment: (NOTE) SARS-CoV-2 target nucleic acids are NOT DETECTED.  The SARS-CoV-2 RNA is generally detectable in upper and lower respiratory specimens during the acute phase of infection. Negative results do not preclude SARS-CoV-2 infection, do not rule out co-infections with other pathogens, and should not be used as the sole basis for treatment or other patient management decisions. Negative results must be combined with clinical observations, patient history, and epidemiological information. The expected result is Negative.  Fact Sheet for Patients: SugarRoll.be  Fact Sheet for Healthcare Providers: https://www.woods-mathews.com/  This test is not yet approved or cleared by the [place] FDA and  has been authorized for detection and/or diagnosis of SARS-CoV-2 by FDA under an Emergency Use Authorization (EUA). This EUA will remain  in effect (meaning this test can be used) for the duration of the COVID-19 declaration under Se ction 564(b)(1) of the Act, 21 U.S.C. section 360bbb-3(b)(1), unless the authorization is terminated or revoked sooner.  Performed at [facility], [place] [address], [place], [place] [address]   Culture, blood (routine x 2)     Status: None (Preliminary result)   Collection Time: 12/12/20  2:56 PM   Specimen: BLOOD  Result Value Ref Range Status   Specimen Description BLOOD BLOOD RIGHT FOREARM  Final   Special Requests   Final    BOTTLES DRAWN AEROBIC AND ANAEROBIC Blood Culture adequate volume   Culture   Final     NO GROWTH < 24 HOURS Performed at [facility], [address], [place],  [address]    Report Status  PENDING  Incomplete  Gastrointestinal Panel by PCR , Stool     Status: Abnormal   Collection Time: 12/13/20  9:40 AM   Specimen: Stool  Result Value Ref Range Status   Campylobacter species NOT DETECTED NOT DETECTED Final   Plesimonas shigelloides NOT DETECTED NOT DETECTED Final   Salmonella species NOT DETECTED NOT DETECTED Final   Yersinia enterocolitica NOT DETECTED NOT DETECTED Final   Vibrio species NOT DETECTED NOT DETECTED Final   Vibrio cholerae NOT DETECTED NOT DETECTED Final   Enteroaggregative E coli (EAEC) NOT DETECTED NOT DETECTED Final   Enteropathogenic E coli (EPEC) NOT DETECTED NOT DETECTED Final   Enterotoxigenic E coli (ETEC) NOT DETECTED NOT DETECTED Final   Shiga like toxin producing E coli (STEC) NOT DETECTED NOT DETECTED Final   Shigella/Enteroinvasive E coli (EIEC) NOT DETECTED NOT DETECTED Final   Cryptosporidium NOT DETECTED NOT DETECTED Final   Cyclospora cayetanensis NOT DETECTED NOT DETECTED Final   Entamoeba histolytica NOT DETECTED NOT DETECTED Final   Giardia lamblia NOT DETECTED NOT DETECTED Final   Adenovirus F40/41 DETECTED (A) NOT DETECTED Final   Astrovirus NOT DETECTED NOT DETECTED Final   Norovirus GI/GII NOT DETECTED NOT DETECTED Final   Rotavirus A NOT DETECTED NOT DETECTED Final   Sapovirus (I, II, IV, and V) NOT DETECTED NOT DETECTED Final    Comment: Performed at [facility], [address] [place], [place] [address]  C Difficile Quick Screen (NO PCR Reflex)     Status: Abnormal   Collection Time: 12/13/20  9:40 AM   Specimen: STOOL  Result Value Ref Range Status   C Diff antigen POSITIVE (A) NEGATIVE Final   C Diff toxin NEGATIVE NEGATIVE Final   C Diff interpretation   Final    Results are indeterminate. Please contact the provider listed for your campus for C diff questions in [place].    Comment:  Performed at [facility], [address] [place], [place] [address]  C. Diff by PCR, Reflexed     Status: None   Collection Time: 12/13/20  9:40 AM  Result Value Ref Range Status   Toxigenic C. Difficile by PCR NEGATIVE NEGATIVE Final    Comment: Patient is colonized with non toxigenic C. difficile. May not need treatment unless significant symptoms are present. Performed at [facility], [address] [place], [place] [address]      Studies: CT Abdomen Pelvis Wo Contrast  Result Date: 12/12/2020 CLINICAL DATA:  Colitis, sepsis, worsening symptoms, weaker; history hypertension, end-stage renal disease on dialysis, type II diabetes mellitus EXAM: CT ABDOMEN AND PELVIS WITHOUT CONTRAST TECHNIQUE: Multidetector CT imaging of the abdomen and pelvis was performed following the standard protocol without IV contrast. Sagittal and coronal MPR images reconstructed from axial data set. No oral contrast administered. COMPARISON:  12/05/2020 FINDINGS: Lower chest: Lung bases clear Hepatobiliary: Gallbladder surgically absent. No focal hepatic masses. Mild intrahepatic and extrahepatic biliary dilatation. Pancreas: Normal appearance Spleen: Normal appearance Adrenals/Urinary Tract: Bladder decompressed. Adrenal gland thickening without discrete mass. Small BILATERAL renal cysts, suboptimally characterized. Additional hyperdense lesion at inferior pole LEFT kidney 10 mm diameter unchanged. Stomach/Bowel: Short segment of normal appendix visualized. Diffuse colonic wall thickening involving the ascending and descending colon consistent with colitis. Distal sigmoid anastomotic staple line. Tiny hiatal hernia. Stomach otherwise unremarkable. Suboptimal assessment of small-bowel wall thickness due to lack of IV and oral contrast but question diffusely thickened Vascular/Lymphatic: Extensive atherosclerotic calcifications involving coronary arteries, aorta, iliac arteries, visceral arteries.  Dilatation of distal abdominal aorta 2.6 cm diameter versus 1.5 cm diameter proximal diameter. Displaced intimal calcifications consistent with chronic dissection. Reproductive: Atrophic uterus.  Unremarkable adnexa Other: Scattered ascites throughout abdomen and pelvis. Peritoneal dialysis catheter present. No free air. No hernia. Musculoskeletal: Degenerative disc disease changes L4-L5. IMPRESSION: Scattered colonic wall thickening involving the ascending and descending colon as well as splenic flexure and questionably small bowel loops; this could reflect inflammatory bowel disease or infection, ischemia considered less likely but not entirely excluded. Little change in bowel wall thickening since previous study. Peritoneal dialysis catheter with scattered ascites. Mild intrahepatic and extrahepatic biliary dilatation, likely related to prior cholecystectomy, recommend correlation with LFTs. Tiny hiatal hernia. 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 This recommendation follows ACR consensus guidelines: White Paper of the ACR Incidental Findings Committee II on Vascular Findings. J Am Coll Radiol 2013; 10:789-794. Aortic Atherosclerosis (ICD10-I70.0). Electronically Signed   By: Lavonia Dana M.D.   On: 12/12/2020 15:05   DG Chest Portable 1 View  Result Date: 12/12/2020 CLINICAL DATA:  80 year old female with nausea and vomiting. EXAM: PORTABLE CHEST 1 VIEW COMPARISON:  Chest radiograph dated 12/11/2019 FINDINGS: Background of emphysema. There is minimal eventration of the left hemidiaphragm with probable minimal left lung base atelectasis. Overall interval improvement in aeration of the lungs compared to prior radiograph. No focal consolidation, pleural effusion or pneumothorax. The cardiac  silhouette is within limits. Atherosclerotic calcification of the aorta. No acute osseous pathology. IMPRESSION: 1. No acute cardiopulmonary process. 2. Emphysema. Electronically Signed   By: Anner Crete M.D.   On: 12/12/2020 16:45    Scheduled Meds: . amLODipine  2.5 mg Oral Daily  . aspirin  81 mg Oral Daily  . atorvastatin  40 mg Oral QHS  . calcium acetate  1,334 mg Oral TID WC  . escitalopram  10 mg Oral Daily  . fluticasone furoate-vilanterol  1 puff Inhalation Daily   And  . umeclidinium bromide  1 puff Inhalation Daily  . gentamicin cream  1 application Topical Daily  . heparin  5,000 Units Subcutaneous Q8H  . insulin aspart  0-5 Units Subcutaneous QHS  . insulin aspart  0-6 Units Subcutaneous TID WC  . mirtazapine  7.5 mg Oral QHS  . multivitamin  1 tablet Oral Daily  . pantoprazole  40 mg Oral Daily   Continuous Infusions: . sodium chloride 250 mL (12/13/20 0622)  . cefTRIAXone (ROCEPHIN)  IV 2 g (12/12/20 2234)  . dialysis solution 1.5% low-MG/low-CA    . lactated ringers 75 mL/hr at 12/13/20 1220  . metronidazole 500 mg (12/13/20 [id number])    Assessment/Plan:  1. Acute colitis with leukocytosis, abdominal pain and lactic acidosis.  Recent hospitalization for similar issue.  White count very elevated today.  Recheck again tomorrow.  We will get a GI consultation.  Continue empiric Rocephin and Flagyl.  Stool for C. difficile negative.  Stool comprehensive panel positive for adenovirus.  This potentially could be ischemic colitis with atherosclerosis seen on CT scan of numerous arteries and patient also has tobacco abuse and diabetes.  As needed pain medication.  I do not think this is SBP because white blood cells less than 250. 2. Relative hypotension.  Started on IV fluids and will hold Norvasc at this time. 3. Possible pancreatitis.  IV fluid hydration clear  liquid diet.  CT scan did not comment on pancreatitis.  Patient's gallbladder is already been removed.  Unclear  significance of elevated lipase with a dialysis patient.  And on triglycerides. 4. Type 2 diabetes mellitus with end-stage renal disease.  Nephrology continue with peritoneal dialysis.  Hold Lantus since on clear liquid diet.  Sliding scale for now 5. COPD.  On Trelegy inhaler.  As needed albuterol inhaler.  She wears oxygen at night and sometimes during the day 6. Hyperlipidemia unspecified on Lipitor 7. Anxiety depression on Lexapro 8. Tobacco abuse. 9. Hypokalemia and hypomagnesemia.  Dialysis to help out with electrolytes  Code Status:     Code Status Orders  (From admission, onward)         Start     Ordered   12/12/20 1739  Full code  Continuous        12/12/20 1739        Code Status History    Date Active Date Inactive Code Status Order ID Comments User Context   12/05/2020 1411 12/08/2020 1612 Full Code [id number]  Ivor Costa, MD Inpatient   12/11/2019 1227 12/14/2019 1844 Full Code [id number]  Ivor Costa, MD ED   07/27/2019 2253 07/30/2019 1856 Full Code [id number]  Epifanio Lesches, MD Inpatient   Advance Care Planning Activity     Family Communication: Spoke with daughter on the phone Disposition Plan: Status is: Inpatient  Dispo: The patient is from: Home              Anticipated d/c is to: Home              Anticipated d/c date is: Likely will need another 3 or 4 days here in the hospital              Patient currently still having abdominal pain and white count becoming more elevated.  On IV fluids.   Difficult to place patient.  No  Time spent: 28 minutes  [place]

## 2020-12-13 NOTE — Plan of Care (Signed)
Axox4. Calm and cooperative and able to voice her needs. Can use bedside commode. Generalized weakness noted. On RA. Prn pain med adm for abdominal pain. BP being monitored closely. PD being done. Standing orders for fluid monitoring communicated with oncoming RN. Pt daughter at  bedside and updated on POC. Safety measures in place. Will continue to monitor.   Problem: Education: Goal: Knowledge of General Education information will improve Description: Including pain rating scale, medication(s)/side effects and non-pharmacologic comfort measures Outcome: Progressing   Problem: Health Behavior/Discharge Planning: Goal: Ability to manage health-related needs will improve Outcome: Progressing   Problem: Clinical Measurements: Goal: Ability to maintain clinical measurements within normal limits will improve Outcome: Progressing Goal: Will remain free from infection Outcome: Progressing Goal: Diagnostic test results will improve Outcome: Progressing Goal: Respiratory complications will improve Outcome: Progressing Goal: Cardiovascular complication will be avoided Outcome: Progressing   Problem: Activity: Goal: Risk for activity intolerance will decrease Outcome: Progressing   Problem: Nutrition: Goal: Adequate nutrition will be maintained Outcome: Progressing   Problem: Coping: Goal: Level of anxiety will decrease Outcome: Progressing   Problem: Elimination: Goal: Will not experience complications related to bowel motility Outcome: Progressing Goal: Will not experience complications related to urinary retention Outcome: Progressing   Problem: Pain Managment: Goal: General experience of comfort will improve Outcome: Progressing   Problem: Safety: Goal: Ability to remain free from injury will improve Outcome: Progressing   Problem: Skin Integrity: Goal: Risk for impaired skin integrity will decrease Outcome: Progressing

## 2020-12-13 NOTE — Progress Notes (Signed)
Initial Nutrition Assessment  RD working remotely.  DOCUMENTATION CODES:   Not applicable  INTERVENTION:  - diet advancement as medically feasible. - will complete NFPE when feasible.  NUTRITION DIAGNOSIS:   Increased nutrient needs related to acute illness,chronic illness (ESRD on dialysis) as evidenced by estimated needs.  GOAL:   Patient will meet greater than or equal to 90% of their needs  MONITOR:   Diet advancement,Labs,Weight trends,I & O's  REASON FOR ASSESSMENT:   Malnutrition Screening Tool  ASSESSMENT:   80 y.o. female with medical history of HTN, CKD; ESRD on nightly peritoneal dialysis, GERD, HLD, Barrett's esophagus, anemia, hyperparathyroidism, and recent hospitalization d/t colitis. She continued to have abdominal pain and N/V/D after d/c. In the ED, CT consistent with colitis.  She has been NPO since admission. She was assessed remotely by another RD on 12/05/20. No other [place] RD assessments at any time in the past.   Weight yesterday was documented as 120 lb and appears to be a stated weight. Weight on 12/08/20 was 113 lb and weight on 09/23/20 was 131 lb. This indicates 11 lb weight loss (8.4% body weight) in the past 2.5-3 months; significant for time frame if accurate.  No information documented in the edema section of flow sheet.   Patient reports that appetite and intakes have been decreased for the past 2-3 weeks d/t colitis and associated abdominal pain and N/V/D. Intakes of solid foods, if more than a couple bites, worsens pain. Liquids are tolerated well overall.   Dialysis is done at night after she has dinner so prior to acute illness, this did not impact PO intakes or cause her to feel too full to eat.   MD note from yesterday evening states plan to consult GI and Nephrology.    Labs reviewed; Na: 130 mmol/l, K: 3.2 mmol/l, Cl: 93 mmol/l, BUN: 34 mg/dl, creatinine: 7.95 mg/dl, Ca: 7.1 mg/dl, GFR: 5 ml/min. Medications reviewed; 1334 mg  phoslo TID, sliding scale novolog, 1 tablet renavit/day, 40 mg oral protonix/day,     NUTRITION - FOCUSED PHYSICAL EXAM:  unable to complete at this time.   Diet Order:   Diet Order            Diet NPO time specified  Diet effective now                 EDUCATION NEEDS:   Not appropriate for education at this time  Skin:  Skin Assessment: Reviewed RN Assessment  Last BM:  PTA/unknown  Height:   Ht Readings from Last 1 Encounters:  12/12/20 '5\' 6"'$  (1.676 m)    Weight:   Wt Readings from Last 1 Encounters:  12/12/20 54.4 kg    Estimated Nutritional Needs:  Kcal:  1635-1905 kcal (30-35 kcal/kg) Protein:  80-95 grams Fluid:  UOP + 1L/day      Jarome Matin, MS, RD, LDN, CNSC Inpatient Clinical Dietitian RD pager # available in [facility]  After hours/weekend pager # available in [facility]

## 2020-12-14 ENCOUNTER — Inpatient Hospital Stay: Payer: Medicare Other

## 2020-12-14 ENCOUNTER — Encounter: Payer: Self-pay | Admitting: Internal Medicine

## 2020-12-14 DIAGNOSIS — E872 Acidosis: Secondary | ICD-10-CM | POA: Diagnosis not present

## 2020-12-14 DIAGNOSIS — G9341 Metabolic encephalopathy: Secondary | ICD-10-CM | POA: Diagnosis not present

## 2020-12-14 DIAGNOSIS — K529 Noninfective gastroenteritis and colitis, unspecified: Secondary | ICD-10-CM | POA: Diagnosis not present

## 2020-12-14 DIAGNOSIS — R1084 Generalized abdominal pain: Secondary | ICD-10-CM | POA: Diagnosis not present

## 2020-12-14 DIAGNOSIS — D72823 Leukemoid reaction: Secondary | ICD-10-CM

## 2020-12-14 LAB — GLUCOSE, CAPILLARY
Glucose-Capillary: 152 mg/dL — ABNORMAL HIGH (ref 70–99)
Glucose-Capillary: 160 mg/dL — ABNORMAL HIGH (ref 70–99)
Glucose-Capillary: 124 mg/dL — ABNORMAL HIGH (ref 70–99)
Glucose-Capillary: 191 mg/dL — ABNORMAL HIGH (ref 70–99)

## 2020-12-14 LAB — BASIC METABOLIC PANEL
Anion gap: 16 — ABNORMAL HIGH (ref 5–15)
BUN: 37 mg/dL — ABNORMAL HIGH (ref 8–23)
CO2: 20 mmol/L — ABNORMAL LOW (ref 22–32)
Calcium: 7.9 mg/dL — ABNORMAL LOW (ref 8.9–10.3)
Chloride: 93 mmol/L — ABNORMAL LOW (ref 98–111)
Creatinine, Ser: 7.57 mg/dL — ABNORMAL HIGH (ref 0.44–1.00)
GFR, Estimated: 5 mL/min — ABNORMAL LOW (ref >60)
Glucose, Bld: 212 mg/dL — ABNORMAL HIGH (ref 70–99)
Potassium: 3 mmol/L — ABNORMAL LOW (ref 3.5–5.1)
Sodium: 129 mmol/L — ABNORMAL LOW (ref 135–145)

## 2020-12-14 LAB — CBC
HCT: 34.1 % — ABNORMAL LOW (ref 36.0–46.0)
Hemoglobin: 11.1 g/dL — ABNORMAL LOW (ref 12.0–15.0)
MCH: 30 pg (ref 26.0–34.0)
MCHC: 32.6 g/dL (ref 30.0–36.0)
MCV: 92.2 fL (ref 80.0–100.0)
Platelets: 183 10*3/uL (ref 150–400)
RBC: 3.7 MIL/uL — ABNORMAL LOW (ref 3.87–5.11)
RDW: 19.6 % — ABNORMAL HIGH (ref 11.5–15.5)
WBC: 38.6 10*3/uL — ABNORMAL HIGH (ref 4.0–10.5)
nRBC: 0.2 % (ref 0.0–0.2)

## 2020-12-14 LAB — LACTIC ACID, PLASMA: Lactic Acid, Venous: 3.7 mmol/L (ref 0.5–1.9)

## 2020-12-14 LAB — LIPASE, BLOOD: Lipase: 257 U/L — ABNORMAL HIGH (ref 11–51)

## 2020-12-14 MED ORDER — OXYCODONE-ACETAMINOPHEN 5-325 MG PO TABS: 1.0000 | ORAL_TABLET | Freq: Three times a day (TID) | ORAL | Status: DC | PRN

## 2020-12-14 MED ORDER — CHLORHEXIDINE GLUCONATE CLOTH 2 % EX PADS
6.0000 | MEDICATED_PAD | Freq: Every day | CUTANEOUS | Status: DC
  Administered 2020-12-15 – 2020-12-17 (×3): 6 via TOPICAL

## 2020-12-14 MED ORDER — MORPHINE SULFATE (PF) 2 MG/ML IV SOLN
1.0000 mg | INTRAVENOUS | Status: DC | PRN
  Administered 2020-12-17 – 2020-12-19 (×3): 1 mg via INTRAVENOUS
  Filled 2020-12-14 (×4): qty 1

## 2020-12-14 MED ORDER — PANTOPRAZOLE SODIUM 40 MG IV SOLR
40.0000 mg | INTRAVENOUS | Status: DC
  Administered 2020-12-14 – 2020-12-17 (×3): 40 mg via INTRAVENOUS
  Filled 2020-12-14 (×4): qty 40

## 2020-12-14 MED ORDER — IOHEXOL 350 MG/ML SOLN
75.0000 mL | Freq: Once | INTRAVENOUS | Status: AC | PRN
  Administered 2020-12-14: 75 mL via INTRAVENOUS

## 2020-12-14 MED ORDER — OXYCODONE-ACETAMINOPHEN 5-325 MG PO TABS
1.0000 | ORAL_TABLET | Freq: Three times a day (TID) | ORAL | Status: DC | PRN
Start: 2020-12-14 — End: 2020-12-14

## 2020-12-14 MED ORDER — POTASSIUM CHLORIDE CRYS ER 20 MEQ PO TBCR: 20.0000 meq | EXTENDED_RELEASE_TABLET | Freq: Every day | ORAL | Status: DC

## 2020-12-14 MED ORDER — VANCOMYCIN 50 MG/ML ORAL SOLUTION
125.0000 mg | Freq: Four times a day (QID) | ORAL | Status: DC
  Filled 2020-12-14 (×7): qty 2.5

## 2020-12-14 MED ORDER — PIPERACILLIN-TAZOBACTAM IN DEX 2-0.25 GM/50ML IV SOLN
2.2500 g | Freq: Three times a day (TID) | INTRAVENOUS | Status: DC
  Administered 2020-12-14 – 2020-12-17 (×10): 2.25 g via INTRAVENOUS
  Filled 2020-12-14 (×13): qty 50

## 2020-12-14 NOTE — Progress Notes (Signed)
Patient ID: Anita Wells, female   DOB: December 18, 1940, 80 y.o.   MRN: [id number]  Earlier I spoke with daughter at the bedside and daughter on the phone and patient and Dr. Candiss Norse nephrology.  Patient has not been eating well at home for a while now and eating very small meals and having abdominal pain.  Could go along with ischemic colitis.  Dr Loletha Grayer

## 2020-12-14 NOTE — Progress Notes (Signed)
   12/14/20 1830  Clinical Encounter Type  Visited With Patient and family together  Visit Type Initial  Referral From Nurse  Consult/Referral To [place] did the education for an AD with Pt's daughter.  Daughter stated, " her father will be up here in the morning and they will decided who will be the health care agent. Daughter said she and her sister will read over it and they will probably complete it tomorrow. Left one pamphlet with Pt's daughter.

## 2020-12-14 NOTE — Progress Notes (Signed)
[facility] GI inpatient brief Progress Note  Events noted. Patient with increased abdominal pain, Dr. Windell Moment from surgery saw patient.   Vitals:   12/14/20 0507 12/14/20 1114  BP: 104/65 108/66  Pulse: (!) 105 (!) 105  Resp: 20 16  Temp: (!) 97 F (36.1 C) 98 F (36.7 C)  SpO2: 95% 98%          Impression:  1. Increased abdominal pain - Surgery input appreciated. They advise NPO for now.R/O ischemia, probably mesenteric. 2. Nausea vomiting. As above.    Plan:  1.Await CTA 2.Following   Will continue to folow   Thank you  T. Madolyn Frieze, M.D. AB[facility] Diplomate in Gastroenterology [place] [phone] - Cell

## 2020-12-14 NOTE — Progress Notes (Signed)
[place], [place] 12/14/20  Subjective:   Anita Wells is a 80 y.o. African American female with PMHX of anermia, barett's esophagus, carotid stenosis, CKD, GERD, HTN and Hyperlipidemia. She presents to the ED with abdominal pain. She had a recent hospitalization for colitis. She was prescribed antibiotics and discharged. She continues to have nausea, vomiting and diarrhea. No blood reported. CT scan shows colitis.  Patient continues to have abdominal pain especially in the left lower quadrant Underwent CT angiogram this afternoon which Shows changes consistent with mesenteric disease as detailed in the report .  Objective:  Vital signs in last 24 hours:  Temp:  [97 F (36.1 C)-98.1 F (36.7 C)] 98 F (36.7 C) (02/20 1114) Pulse Rate:  [84-105] 105 (02/20 1114) Resp:  [16-20] 16 (02/20 1114) BP: (93-108)/(55-66) 108/66 (02/20 1114) SpO2:  [95 %-100 %] 98 % (02/20 1114)  Weight change:  Filed Weights   12/12/20 1220  Weight: 54.4 kg    Intake/Output:    Intake/Output Summary (Last 24 hours) at 12/14/2020 1438 Last data filed at 12/14/2020 1300 Gross per 24 hour  Intake 2415.24 ml  Output -  Net 2415.24 ml     Physical Exam: General: NAD, ill appearing  HEENT Normocephalic, dry oral mucosa and lips  Pulm/lungs Clear bilaterally  CVS/Heart S1-S2 present  Abdomen:  soft, left lower quadrant tenderness  Extremities: Minimal peripheral edema  Neurologic: Alert, oriented  Skin: Dry, no lesions  Access: PD catheter       Basic Metabolic Panel:  Recent Labs  Lab 12/12/20 1350 12/13/20 0427 12/14/20 0458  NA 130* 130* 129*  K 3.4* 3.2* 3.0*  CL 89* 93* 93*  CO2 20* 22 20*  GLUCOSE 136* 216* 212*  BUN 33* 34* 37*  CREATININE 9.12* 7.95* 7.57*  CALCIUM 7.7* 7.1* 7.9*  MG 1.6*  --   --      CBC: Recent Labs  Lab 12/12/20 1241 12/13/20 0427 12/13/20 0428 12/14/20 0458  WBC 29.8* 52.3* 52.3* 38.6*  NEUTROABS  --   --  48.8*   --   HGB 14.4 12.1 12.1 11.1*  HCT 46.0 39.1 39.1 34.1*  MCV 94.8 95.1 95.6 92.2  PLT 293 206 206 183     No results found for: HEPBSAG, HEPBSAB, HEPBIGM    Microbiology:  Recent Results (from the past 240 hour(s))  C Difficile Quick Screen w PCR reflex     Status: Abnormal   Collection Time: 12/05/20  9:28 AM   Specimen: Stool  Result Value Ref Range Status   C Diff antigen POSITIVE (A) NEGATIVE Final   C Diff toxin NEGATIVE NEGATIVE Final   C Diff interpretation Results are indeterminate. See PCR results.  Final    Comment: Performed at [facility], [address] [place], [place] [address]  C. Diff by PCR, Reflexed     Status: None   Collection Time: 12/05/20  9:28 AM  Result Value Ref Range Status   Toxigenic C. Difficile by PCR NEGATIVE NEGATIVE Final    Comment: Patient is colonized with non toxigenic C. difficile. May not need treatment unless significant symptoms are present. Performed at [facility], [place],  [address]   SARS CORONAVIRUS 2 (TAT 6-24 HRS) Nasopharyngeal Nasopharyngeal Swab     Status: None   Collection Time: 12/05/20 10:40 AM   Specimen: Nasopharyngeal Swab  Result Value Ref Range Status   SARS Coronavirus 2 NEGATIVE NEGATIVE Final    Comment: (  NOTE) SARS-CoV-2 target nucleic acids are NOT DETECTED.  The SARS-CoV-2 RNA is generally detectable in upper and lower respiratory specimens during the acute phase of infection. Negative results do not preclude SARS-CoV-2 infection, do not rule out co-infections with other pathogens, and should not be used as the sole basis for treatment or other patient management decisions. Negative results must be combined with clinical observations, patient history, and epidemiological information. The expected result is Negative.  Fact Sheet for Patients: SugarRoll.be  Fact Sheet for Healthcare  Providers: https://www.woods-mathews.com/  This test is not yet approved or cleared by the [place] FDA and  has been authorized for detection and/or diagnosis of SARS-CoV-2 by FDA under an Emergency Use Authorization (EUA). This EUA will remain  in effect (meaning this test can be used) for the duration of the COVID-19 declaration under Se ction 564(b)(1) of the Act, 21 U.S.C. section 360bbb-3(b)(1), unless the authorization is terminated or revoked sooner.  Performed at [facility], [place] [address], [place], [place] [address]   Culture, blood (Routine X 2) w Reflex to ID Panel     Status: None   Collection Time: 12/05/20 11:30 AM   Specimen: BLOOD  Result Value Ref Range Status   Specimen Description BLOOD RIGHT ANTECUBITAL  Final   Special Requests   Final    BOTTLES DRAWN AEROBIC AND ANAEROBIC Blood Culture results may not be optimal due to an inadequate volume of blood received in culture bottles   Culture   Final    NO GROWTH 5 DAYS Performed at [facility], [address] [place], [place] [address]    Report Status 12/10/2020 FINAL  Final  Culture, blood (Routine X 2) w Reflex to ID Panel     Status: None   Collection Time: 12/05/20 11:30 AM   Specimen: BLOOD  Result Value Ref Range Status   Specimen Description BLOOD BLOOD RIGHT WRIST  Final   Special Requests   Final    BOTTLES DRAWN AEROBIC AND ANAEROBIC Blood Culture results may not be optimal due to an inadequate volume of blood received in culture bottles   Culture   Final    NO GROWTH 5 DAYS Performed at [facility], [address] [place], [place] [address]    Report Status 12/10/2020 FINAL  Final  Gastrointestinal Panel by PCR , Stool     Status: None   Collection Time: 12/05/20  4:40 PM   Specimen: Stool  Result Value Ref Range Status   Campylobacter species NOT DETECTED NOT DETECTED Final   Plesimonas shigelloides NOT DETECTED NOT DETECTED Final   Salmonella  species NOT DETECTED NOT DETECTED Final   Yersinia enterocolitica NOT DETECTED NOT DETECTED Final   Vibrio species NOT DETECTED NOT DETECTED Final   Vibrio cholerae NOT DETECTED NOT DETECTED Final   Enteroaggregative E coli (EAEC) NOT DETECTED NOT DETECTED Final   Enteropathogenic E coli (EPEC) NOT DETECTED NOT DETECTED Final   Enterotoxigenic E coli (ETEC) NOT DETECTED NOT DETECTED Final   Shiga like toxin producing E coli (STEC) NOT DETECTED NOT DETECTED Final   Shigella/Enteroinvasive E coli (EIEC) NOT DETECTED NOT DETECTED Final   Cryptosporidium NOT DETECTED NOT DETECTED Final   Cyclospora cayetanensis NOT DETECTED NOT DETECTED Final   Entamoeba histolytica NOT DETECTED NOT DETECTED Final   Giardia lamblia NOT DETECTED NOT DETECTED Final   Adenovirus F40/41 NOT DETECTED NOT DETECTED Final   Astrovirus NOT DETECTED NOT DETECTED Final   Norovirus GI/GII NOT DETECTED  NOT DETECTED Final   Rotavirus A NOT DETECTED NOT DETECTED Final   Sapovirus (I, II, IV, and V) NOT DETECTED NOT DETECTED Final    Comment: Performed at [facility], [address] [place], [place] [address]  Culture, blood (routine x 2)     Status: None (Preliminary result)   Collection Time: 12/12/20  1:50 PM   Specimen: BLOOD  Result Value Ref Range Status   Specimen Description BLOOD BLOOD RIGHT ARM  Final   Special Requests   Final    BOTTLES DRAWN AEROBIC AND ANAEROBIC Blood Culture adequate volume   Culture   Final    NO GROWTH 2 DAYS Performed at [facility], [place], [place] [address]    Report Status PENDING  Incomplete  SARS CORONAVIRUS 2 (TAT 6-24 HRS) Nasopharyngeal Nasopharyngeal Swab     Status: None   Collection Time: 12/12/20  2:30 PM   Specimen: Nasopharyngeal Swab  Result Value Ref Range Status   SARS Coronavirus 2 NEGATIVE NEGATIVE Final    Comment: (NOTE) SARS-CoV-2 target nucleic acids are NOT DETECTED.  The SARS-CoV-2 RNA is generally detectable in  upper and lower respiratory specimens during the acute phase of infection. Negative results do not preclude SARS-CoV-2 infection, do not rule out co-infections with other pathogens, and should not be used as the sole basis for treatment or other patient management decisions. Negative results must be combined with clinical observations, patient history, and epidemiological information. The expected result is Negative.  Fact Sheet for Patients: SugarRoll.be  Fact Sheet for Healthcare Providers: https://www.woods-mathews.com/  This test is not yet approved or cleared by the [place] FDA and  has been authorized for detection and/or diagnosis of SARS-CoV-2 by FDA under an Emergency Use Authorization (EUA). This EUA will remain  in effect (meaning this test can be used) for the duration of the COVID-19 declaration under Se ction 564(b)(1) of the Act, 21 U.S.C. section 360bbb-3(b)(1), unless the authorization is terminated or revoked sooner.  Performed at [facility], [place] [address], [place], [place] [address]   Culture, blood (routine x 2)     Status: None (Preliminary result)   Collection Time: 12/12/20  2:56 PM   Specimen: BLOOD  Result Value Ref Range Status   Specimen Description BLOOD BLOOD RIGHT FOREARM  Final   Special Requests   Final    BOTTLES DRAWN AEROBIC AND ANAEROBIC Blood Culture adequate volume   Culture   Final    NO GROWTH 2 DAYS Performed at [facility], [address] [place], [place] [address]    Report Status PENDING  Incomplete  Gastrointestinal Panel by PCR , Stool     Status: Abnormal   Collection Time: 12/13/20  9:40 AM   Specimen: Stool  Result Value Ref Range Status   Campylobacter species NOT DETECTED NOT DETECTED Final   Plesimonas shigelloides NOT DETECTED NOT DETECTED Final   Salmonella species NOT DETECTED NOT DETECTED Final   Yersinia enterocolitica NOT DETECTED NOT DETECTED Final    Vibrio species NOT DETECTED NOT DETECTED Final   Vibrio cholerae NOT DETECTED NOT DETECTED Final   Enteroaggregative E coli (EAEC) NOT DETECTED NOT DETECTED Final   Enteropathogenic E coli (EPEC) NOT DETECTED NOT DETECTED Final   Enterotoxigenic E coli (ETEC) NOT DETECTED NOT DETECTED Final   Shiga like toxin producing E coli (STEC) NOT DETECTED NOT DETECTED Final   Shigella/Enteroinvasive E coli (EIEC) NOT DETECTED NOT DETECTED Final   Cryptosporidium  NOT DETECTED NOT DETECTED Final   Cyclospora cayetanensis NOT DETECTED NOT DETECTED Final   Entamoeba histolytica NOT DETECTED NOT DETECTED Final   Giardia lamblia NOT DETECTED NOT DETECTED Final   Adenovirus F40/41 DETECTED (A) NOT DETECTED Final   Astrovirus NOT DETECTED NOT DETECTED Final   Norovirus GI/GII NOT DETECTED NOT DETECTED Final   Rotavirus A NOT DETECTED NOT DETECTED Final   Sapovirus (I, II, IV, and V) NOT DETECTED NOT DETECTED Final    Comment: Performed at [facility], [address] [place], [place] [address]  C Difficile Quick Screen (NO PCR Reflex)     Status: Abnormal   Collection Time: 12/13/20  9:40 AM   Specimen: STOOL  Result Value Ref Range Status   C Diff antigen POSITIVE (A) NEGATIVE Final   C Diff toxin NEGATIVE NEGATIVE Final   C Diff interpretation   Final    Results are indeterminate. Please contact the provider listed for your campus for C diff questions in [place].    Comment: Performed at [facility], [address] [place], [place] [address]  C. Diff by PCR, Reflexed     Status: None   Collection Time: 12/13/20  9:40 AM  Result Value Ref Range Status   Toxigenic C. Difficile by PCR NEGATIVE NEGATIVE Final    Comment: Patient is colonized with non toxigenic C. difficile. May not need treatment unless significant symptoms are present. Performed at [facility], [address] [place], [place] [address]     Coagulation Studies: No results for input(s): LABPROT, INR  in the last 72 hours.  Urinalysis: No results for input(s): COLORURINE, LABSPEC, PHURINE, GLUCOSEU, HGBUR, BILIRUBINUR, KETONESUR, PROTEINUR, UROBILINOGEN, NITRITE, LEUKOCYTESUR in the last 72 hours.  Invalid input(s): APPERANCEUR    Imaging: CT Abdomen Pelvis Wo Contrast  Result Date: 12/12/2020 CLINICAL DATA:  Colitis, sepsis, worsening symptoms, weaker; history hypertension, end-stage renal disease on dialysis, type II diabetes mellitus EXAM: CT ABDOMEN AND PELVIS WITHOUT CONTRAST TECHNIQUE: Multidetector CT imaging of the abdomen and pelvis was performed following the standard protocol without IV contrast. Sagittal and coronal MPR images reconstructed from axial data set. No oral contrast administered. COMPARISON:  12/05/2020 FINDINGS: Lower chest: Lung bases clear Hepatobiliary: Gallbladder surgically absent. No focal hepatic masses. Mild intrahepatic and extrahepatic biliary dilatation. Pancreas: Normal appearance Spleen: Normal appearance Adrenals/Urinary Tract: Bladder decompressed. Adrenal gland thickening without discrete mass. Small BILATERAL renal cysts, suboptimally characterized. Additional hyperdense lesion at inferior pole LEFT kidney 10 mm diameter unchanged. Stomach/Bowel: Short segment of normal appendix visualized. Diffuse colonic wall thickening involving the ascending and descending colon consistent with colitis. Distal sigmoid anastomotic staple line. Tiny hiatal hernia. Stomach otherwise unremarkable. Suboptimal assessment of small-bowel wall thickness due to lack of IV and oral contrast but question diffusely thickened Vascular/Lymphatic: Extensive atherosclerotic calcifications involving coronary arteries, aorta, iliac arteries, visceral arteries. Dilatation of distal abdominal aorta 2.6 cm diameter versus 1.5 cm diameter proximal diameter. Displaced intimal calcifications consistent with chronic dissection. Reproductive: Atrophic uterus.  Unremarkable adnexa Other: Scattered  ascites throughout abdomen and pelvis. Peritoneal dialysis catheter present. No free air. No hernia. Musculoskeletal: Degenerative disc disease changes L4-L5. IMPRESSION: Scattered colonic wall thickening involving the ascending and descending colon as well as splenic flexure and questionably small bowel loops; this could reflect inflammatory bowel disease or infection, ischemia considered less likely but not entirely excluded. Little change in bowel wall thickening since previous study. Peritoneal dialysis catheter with scattered ascites. Mild intrahepatic and extrahepatic biliary dilatation, likely  related to prior cholecystectomy, recommend correlation with LFTs. Tiny hiatal hernia. 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 This recommendation follows ACR consensus guidelines: White Paper of the ACR Incidental Findings Committee II on Vascular Findings. J Am Coll Radiol 2013; 10:789-794. Aortic Atherosclerosis (ICD10-I70.0). Electronically Signed   By: Lavonia Dana M.D.   On: 12/12/2020 15:05   DG Chest Portable 1 View  Result Date: 12/12/2020 CLINICAL DATA:  80 year old female with nausea and vomiting. EXAM: PORTABLE CHEST 1 VIEW COMPARISON:  Chest radiograph dated 12/11/2019 FINDINGS: Background of emphysema. There is minimal eventration of the left hemidiaphragm with probable minimal left lung base atelectasis. Overall interval improvement in aeration of the lungs compared to prior radiograph. No focal consolidation, pleural effusion or pneumothorax. The cardiac silhouette is within limits. Atherosclerotic calcification of the aorta. No acute osseous pathology. IMPRESSION: 1. No acute cardiopulmonary process. 2. Emphysema. Electronically Signed   By: Anner Crete M.D.   On: 12/12/2020 16:45    CT Angio Abd/Pel w/ and/or w/o  Addendum Date: 12/14/2020   ADDENDUM REPORT: 12/14/2020 13:47 ADDENDUM: Addendum created after discussing results with surgical team, Dr. Peyton Najjar. Dilated loops of distal small bowel without evidence of bowel obstruction/transition. In addition, there is redemonstration of mild wall thickening of transverse colon, without distension or abnormal enhancement. Either of these findings may be seen with ileus or nonspecific enteritis/colitis. Electronically Signed   By: Corrie Mckusick D.O.   On: 12/14/2020 13:47   Result Date: 12/14/2020 CLINICAL DATA:  80 year old female with possible mesenteric ischemia EXAM: CTA ABDOMEN AND PELVIS WITHOUT AND WITH CONTRAST TECHNIQUE: Multidetector CT imaging of the abdomen and pelvis was performed using the standard protocol during bolus administration of intravenous contrast. Multiplanar reconstructed images and MIPs were obtained and reviewed to evaluate the vascular anatomy. CONTRAST:  36m OMNIPAQUE IOHEXOL 350 MG/ML SOLN COMPARISON:  Noncontrast CT comparison 12/12/2020, 12/05/2020 FINDINGS: VASCULAR Aorta: Moderate to advanced atherosclerotic changes of the distal thoracic aorta. Diameter at the hiatus measures 2.4 cm. No dissection flap identified. Infrarenal abdominal aortic aneurysm measuring 2.5 cm at the IMA takeoff. Smallest diameter of the infrarenal abdominal aorta 14 mm more proximally below the renal arteries. Moderate mixed calcified and soft plaque of the abdominal aorta without evidence of high-grade stenosis. Celiac: Atherosclerotic changes at the origin of the celiac artery. 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 There is then reconstitution of the celiac artery secondary to collateral flow with patency of the left gastric artery, splenic artery, common hepatic artery. SMA: Atherosclerotic changes at the origin of the SMA, with mixed calcified and soft plaque. Estimated 50%  narrowing at the origin. Arcades are patent. Renals: - Right: Atherosclerotic changes at the origin of the right renal artery without evidence of high-grade stenosis. - Left: Patent stent at the origin of the left renal artery. The metallic artifact limits the study for evaluation of in stent stenosis or edge stenosis. Artery is patent distal to the stent, small caliber. IMA: While the IMA is favored to be patent at the origin though likely stenotic given the degree of atherosclerotic plaque of the aorta and IMA. Left colic artery is patent as well as the proximal superior rectal artery. Right lower extremity: Moderate atherosclerotic changes of the right iliac arterial system with mixed  calcified and soft plaque throughout the length of the common iliac artery and external iliac artery. No high-grade stenosis or occlusion identified. Hypogastric artery appears occluded at the origin secondary to calcified plaque. No aneurysm. Moderate atherosclerotic changes of the right common femoral artery. Proximal profunda femoris and SFA patent. Left lower extremity: Moderate atherosclerotic changes of the left iliac arterial system with mixed calcified and soft plaque through the length of the common iliac artery, external iliac artery. No high-grade stenosis or occlusion. Calcified plaque at the origin of the hypogastric artery somewhat limits the evaluation, however, hypogastric appears patent. Mild to moderate atherosclerotic changes of the common femoral artery. There is a proximal origin of the lateral circumflex femoral artery. Proximal SFA and profunda femoris are patent. Veins: Unremarkable appearance of the venous system. Review of the MIP images confirms the above findings. NON-VASCULAR Lower chest: No acute finding of the lower chest. Atelectatic changes at the left greater than right lung base/scarring. Emphysema. Hepatobiliary: Unremarkable appearance of liver. Cholecystectomy. Similar appearance of mildly  dilated bile ducts in the hilum of the liver, unchanged from the noncontrast CT studies. No radiopaque stones. No inflammatory changes at the head of the pancreas. Pancreas: Unremarkable Spleen: Unremarkable Adrenals/Urinary Tract: - Right adrenal gland: Unremarkable - Left adrenal gland: Unremarkable. - Right kidney: No hydronephrosis. Punctate calcifications in the hilum of the kidney, either nonobstructive stones or vascular calcifications. Unremarkable right ureter. Multiple subcentimeter low-density rounded lesions, potentially cysts. - Left Kidney: No hydronephrosis. Vascular calcifications in the hilum of the kidney. Unremarkable left ureter. Rounded lesion on the lateral cortex of the left kidney and rounded lesion on the inferior cortex of the left kidney measuring less than 2 cm each. Both of these appear to be complex cysts given their lack of dynamic enhancement. - Urinary Bladder: Urinary bladder is decompressed. Stomach/Bowel: - Stomach: Unremarkable. - Small bowel: Proximal small bowel decompressed. Distal small bowel demonstrates borderline dilation with fluid. No transition point identified. The length of small bowel demonstrates transmural enhancement, with no segment of small bowel demonstrating differential hypoattenuation/lack of enhancement. Terminal ileum is partially fecalized, without distension. - Appendix: Normal appendix. - Colon: The majority of colon demonstrates relative enhancement similar to that of the small bowel, with the appearance of transmural enhancement maintained throughout its length. There are no segments of bowel demonstrating focal wall thickening/edema. Surgical changes of a colonic anastomosis, rectosigmoid junction. Lymphatic: No adenopathy. Mesenteric: Peritoneal dialysis catheter terminates within the low abdomen/pelvis. Small volume of dialysate. Reproductive: Small volume fluid/tissue within the endometrial canal, unusual for patient of this age. Other: Surgical  changes along the midline.  No hernia. Musculoskeletal: No acute displaced fracture. No bony canal narrowing. Degenerative changes of the spine. No aggressive sclerotic lesions. Lucent lesion on the right aspect of the L2 vertebral body present in 2009 and unchanged. 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 Reconstitution of the vessel from collateral flow. -estimated 50% narrowing of the SMA origin secondary to calcified and soft plaque. Distal vessels patent. -at least high-grade stenosis of the IMA origin secondary to atherosclerotic plaque. Left colic artery and the proximal superior rectal artery opacify. These findings support the possibility of at least chronic mesenteric ischemia. The length of small bowel and colon demonstrate transmural enhancement, with no evidence of ischemic bowel. Dilated loops of distal small bowel without evidence of bowel obstruction/transition. This may be seen with ileus or nonspecific enteritis. 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 Stent  appears patent with the metallic artifact limiting evaluation for any in-stent stenosis/edge stenosis. **An incidental finding of potential clinical significance has been found. Ill-defined soft tissue/fluid within the endometrial canal, which is unexpected in a patient of this age. Referral for gynecologic follow-up recommended, did determine the need for any biopsy and/or MRI as uterine malignancy not excluded** . Additional ancillary findings as above. Signed, Dulcy Fanny. Dellia Nims, [facility] Electronically Signed: By: Corrie Mckusick D.O. On: 12/14/2020 13:26     Medications:   . sodium chloride Stopped (12/13/20 1220)  . dialysis solution 1.5% low-MG/low-CA    . lactated ringers 75 mL/hr at 12/14/20 0304  . piperacillin-tazobactam (ZOSYN)  IV     . aspirin   81 mg Oral Daily  . Chlorhexidine Gluconate Cloth  6 each Topical Daily  . escitalopram  10 mg Oral Daily  . fluticasone furoate-vilanterol  1 puff Inhalation Daily   And  . umeclidinium bromide  1 puff Inhalation Daily  . gentamicin cream  1 application Topical Daily  . heparin  5,000 Units Subcutaneous Q8H  . insulin aspart  0-5 Units Subcutaneous QHS  . insulin aspart  0-6 Units Subcutaneous TID WC  . mirtazapine  7.5 mg Oral QHS  . pantoprazole (PROTONIX) IV  40 mg Intravenous Q24H  . potassium chloride  20 mEq Oral Daily  . vancomycin  125 mg Per Tube QID   sodium chloride, acetaminophen **OR** acetaminophen, albuterol, heparin, morphine injection, ondansetron **OR** ondansetron (ZOFRAN) IV, oxyCODONE-acetaminophen  Assessment/ Plan:  80 y.o. female with PMHX of anermia, barett's esophagus, carotid stenosis, CKD, GERD, HTN and Hyperlipidemia. was admitted on 12/12/2020 for Colitis [K52.9]  Active Problems:   Type 2 diabetes mellitus with ESRD (end-stage renal disease) (HCC)   Colitis   Lactic acidosis   Acute pancreatitis without infection or necrosis   Hypotension   Acute metabolic encephalopathy  Colitis [K52.9] Generalized abdominal pain [R10.84]  #. End Stage Renal Disease on Peritoneal Dialysis -CCPD 10 hours, 4 exchanges, 2000 fills -Continue home regimen PD nightly -Next treatment tonight -Continue lactated ringers '@75'$  ml/hr for hydration -Will monitor fluid and labs to prevent fluid overload   #. Anemia of Chronic kidney disease  Lab Results  Component Value Date   HGB 11.1 (L) 12/14/2020   -will monitor for changes  #. Secondary hyperparathyroidism of renal origin     Component Value Date/Time   PTH 181 (H) 12/12/2019 1505   Lab Results  Component Value Date   PHOS 3.1 12/12/2019  Calcium acetate ordered with meals Monitor calcium and phos level during this admission   #. Diabetes type 2 with CKD Hgb A1c MFr Bld (%)  Date Value  12/12/2019  13.0 (H)  Glucose levels controlled at this time  #Hypokalemia Can be replaced IV as necessary.  #Abdominal pain Work-up is in progress Differential diagnosis includes ischemic colitis versus mesenteric ischemia versus infectious colitis.  Unlikely peritonitis. PD fluid WBC count 144 with 47% neutrophils on December 12, 2020.  Patient is at high risk of morbidity due to multiple underlying acute on chronic issues.  Consider palliative care evaluation.   LOS: [place] 2/20/20222:38 PM  [facility] [place], [place]

## 2020-12-14 NOTE — Progress Notes (Addendum)
Patient ID: Anita Wells, female   DOB: 01/28/1941, 80 y.o.   MRN: [id number] [facility] PROGRESS NOTE  Anita Wells [id number] DOB: 30-Aug-1941 DOA: 12/12/2020 PCP: Dion Body, MD  HPI/Subjective: Patient seen earlier and was lethargic. Patient had more abdominal pain. In speaking with the nurse she did not receive any medications for it. Case discussed with nephrology and general surgery. CT angio was ordered. Patient admitted with colitis.  Objective: Vitals:   12/14/20 0030 12/14/20 0507  BP: 101/64 104/65  Pulse: (!) 103 (!) 105  Resp: 16 20  Temp:  (!) 97 F (36.1 C)  SpO2: 96% 95%    Intake/Output Summary (Last 24 hours) at 12/14/2020 1014 Last data filed at 12/14/2020 0304 Gross per 24 hour  Intake 1765.24 ml  Output -  Net 1765.24 ml   Filed Weights   12/12/20 1220  Weight: 54.4 kg    ROS: Review of Systems  Unable to perform ROS: Acuity of condition   Exam: Physical Exam HENT:     Head: Normocephalic.     Mouth/Throat:     Pharynx: No oropharyngeal exudate.  Eyes:     General: Lids are normal.     Conjunctiva/sclera: Conjunctivae normal.  Cardiovascular:     Rate and Rhythm: Normal rate and regular rhythm.     Heart sounds: Normal heart sounds, S1 normal and S2 normal.  Pulmonary:     Breath sounds: Normal breath sounds. No decreased breath sounds, wheezing, rhonchi or rales.  Abdominal:     Palpations: Abdomen is soft.     Tenderness: There is generalized abdominal tenderness.  Musculoskeletal:     Right ankle: Swelling present.     Left ankle: Swelling present.  Skin:    General: Skin is warm.     Findings: No rash.  Neurological:     Mental Status: She is lethargic.       Data Reviewed: Basic Metabolic Panel: Recent Labs  Lab 12/12/20 1350 12/13/20 0427 12/14/20 0458  NA 130* 130* 129*  K 3.4* 3.2* 3.0*  CL 89* 93* 93*  CO2 20* 22 20*  GLUCOSE 136* 216* 212*  BUN 33* 34* 37*  CREATININE 9.12* 7.95* 7.57*  CALCIUM  7.7* 7.1* 7.9*  MG 1.6*  --   --    Liver Function Tests: Recent Labs  Lab 12/12/20 1350 12/13/20 0427  AST 29 24  ALT 20 17  ALKPHOS 128* 107  BILITOT 0.4 0.5  PROT 5.3* 4.9*  ALBUMIN 2.0* 1.6*   Recent Labs  Lab 12/12/20 1350 12/13/20 0427 12/14/20 0458  LIPASE 836* 924* 257*   CBC: Recent Labs  Lab 12/12/20 1241 12/13/20 0427 12/13/20 0428 12/14/20 0458  WBC 29.8* 52.3* 52.3* 38.6*  NEUTROABS  --   --  48.8*  --   HGB 14.4 12.1 12.1 11.1*  HCT 46.0 39.1 39.1 34.1*  MCV 94.8 95.1 95.6 92.2  PLT 293 206 206 183    CBG: Recent Labs  Lab 12/13/20 0823 12/13/20 1234 12/13/20 1651 12/13/20 2051 12/14/20 0755  GLUCAP 161* 131* 138* 183* 152*    Recent Results (from the past 240 hour(s))  C Difficile Quick Screen w PCR reflex     Status: Abnormal   Collection Time: 12/05/20  9:28 AM   Specimen: Stool  Result Value Ref Range Status   C Diff antigen POSITIVE (A) NEGATIVE Final   C Diff toxin NEGATIVE NEGATIVE Final   C Diff interpretation Results are indeterminate. See PCR results.  Final    Comment: Performed at [facility], [address] [place], [place] [address]  C. Diff by PCR, Reflexed     Status: None   Collection Time: 12/05/20  9:28 AM  Result Value Ref Range Status   Toxigenic C. Difficile by PCR NEGATIVE NEGATIVE Final    Comment: Patient is colonized with non toxigenic C. difficile. May not need treatment unless significant symptoms are present. Performed at [facility], [place], [place] [address]   SARS CORONAVIRUS 2 (TAT 6-24 HRS) Nasopharyngeal Nasopharyngeal Swab     Status: None   Collection Time: 12/05/20 10:40 AM   Specimen: Nasopharyngeal Swab  Result Value Ref Range Status   SARS Coronavirus 2 NEGATIVE NEGATIVE Final    Comment: (NOTE) SARS-CoV-2 target nucleic acids are NOT DETECTED.  The SARS-CoV-2 RNA is generally detectable in upper and lower respiratory specimens during the acute phase  of infection. Negative results do not preclude SARS-CoV-2 infection, do not rule out co-infections with other pathogens, and should not be used as the sole basis for treatment or other patient management decisions. Negative results must be combined with clinical observations, patient history, and epidemiological information. The expected result is Negative.  Fact Sheet for Patients: SugarRoll.be  Fact Sheet for Healthcare Providers: https://www.woods-mathews.com/  This test is not yet approved or cleared by the [place] FDA and  has been authorized for detection and/or diagnosis of SARS-CoV-2 by FDA under an Emergency Use Authorization (EUA). This EUA will remain  in effect (meaning this test can be used) for the duration of the COVID-19 declaration under Se ction 564(b)(1) of the Act, 21 U.S.C. section 360bbb-3(b)(1), unless the authorization is terminated or revoked sooner.  Performed at [facility], [place] [address] [place], [place] [address]   Culture, blood (Routine X 2) w Reflex to ID Panel     Status: None   Collection Time: 12/05/20 11:30 AM   Specimen: BLOOD  Result Value Ref Range Status   Specimen Description BLOOD RIGHT ANTECUBITAL  Final   Special Requests   Final    BOTTLES DRAWN AEROBIC AND ANAEROBIC Blood Culture results may not be optimal due to an inadequate volume of blood received in culture bottles   Culture   Final    NO GROWTH 5 DAYS Performed at [facility], [address] [place], [place] [address]    Report Status 12/10/2020 FINAL  Final  Culture, blood (Routine X 2) w Reflex to ID Panel     Status: None   Collection Time: 12/05/20 11:30 AM   Specimen: BLOOD  Result Value Ref Range Status   Specimen Description BLOOD BLOOD RIGHT WRIST  Final   Special Requests   Final    BOTTLES DRAWN AEROBIC AND ANAEROBIC Blood Culture results may not be optimal due to an inadequate volume of blood  received in culture bottles   Culture   Final    NO GROWTH 5 DAYS Performed at [facility], [address] [place], [place] [address]    Report Status 12/10/2020 FINAL  Final  Gastrointestinal Panel by PCR , Stool     Status: None   Collection Time: 12/05/20  4:40 PM   Specimen: Stool  Result Value Ref Range Status   Campylobacter species NOT DETECTED NOT DETECTED Final   Plesimonas shigelloides NOT DETECTED NOT DETECTED Final   Salmonella species NOT DETECTED NOT DETECTED Final   Yersinia enterocolitica NOT DETECTED NOT DETECTED Final  Vibrio species NOT DETECTED NOT DETECTED Final   Vibrio cholerae NOT DETECTED NOT DETECTED Final   Enteroaggregative E coli (EAEC) NOT DETECTED NOT DETECTED Final   Enteropathogenic E coli (EPEC) NOT DETECTED NOT DETECTED Final   Enterotoxigenic E coli (ETEC) NOT DETECTED NOT DETECTED Final   Shiga like toxin producing E coli (STEC) NOT DETECTED NOT DETECTED Final   Shigella/Enteroinvasive E coli (EIEC) NOT DETECTED NOT DETECTED Final   Cryptosporidium NOT DETECTED NOT DETECTED Final   Cyclospora cayetanensis NOT DETECTED NOT DETECTED Final   Entamoeba histolytica NOT DETECTED NOT DETECTED Final   Giardia lamblia NOT DETECTED NOT DETECTED Final   Adenovirus F40/41 NOT DETECTED NOT DETECTED Final   Astrovirus NOT DETECTED NOT DETECTED Final   Norovirus GI/GII NOT DETECTED NOT DETECTED Final   Rotavirus A NOT DETECTED NOT DETECTED Final   Sapovirus (I, II, IV, and V) NOT DETECTED NOT DETECTED Final    Comment: Performed at [facility], [address] [place], [place] [address]  Culture, blood (routine x 2)     Status: None (Preliminary result)   Collection Time: 12/12/20  1:50 PM   Specimen: BLOOD  Result Value Ref Range Status   Specimen Description BLOOD BLOOD RIGHT ARM  Final   Special Requests   Final    BOTTLES DRAWN AEROBIC AND ANAEROBIC Blood Culture adequate volume   Culture   Final    NO GROWTH 2 DAYS Performed  at [facility], [place], [place] [address]    Report Status PENDING  Incomplete  SARS CORONAVIRUS 2 (TAT 6-24 HRS) Nasopharyngeal Nasopharyngeal Swab     Status: None   Collection Time: 12/12/20  2:30 PM   Specimen: Nasopharyngeal Swab  Result Value Ref Range Status   SARS Coronavirus 2 NEGATIVE NEGATIVE Final    Comment: (NOTE) SARS-CoV-2 target nucleic acids are NOT DETECTED.  The SARS-CoV-2 RNA is generally detectable in upper and lower respiratory specimens during the acute phase of infection. Negative results do not preclude SARS-CoV-2 infection, do not rule out co-infections with other pathogens, and should not be used as the sole basis for treatment or other patient management decisions. Negative results must be combined with clinical observations, patient history, and epidemiological information. The expected result is Negative.  Fact Sheet for Patients: SugarRoll.be  Fact Sheet for Healthcare Providers: https://www.woods-mathews.com/  This test is not yet approved or cleared by the [place] FDA and  has been authorized for detection and/or diagnosis of SARS-CoV-2 by FDA under an Emergency Use Authorization (EUA). This EUA will remain  in effect (meaning this test can be used) for the duration of the COVID-19 declaration under Se ction 564(b)(1) of the Act, 21 U.S.C. section 360bbb-3(b)(1), unless the authorization is terminated or revoked sooner.  Performed at [facility], [place] [address] [place], [place] [address]   Culture, blood (routine x 2)     Status: None (Preliminary result)   Collection Time: 12/12/20  2:56 PM   Specimen: BLOOD  Result Value Ref Range Status   Specimen Description BLOOD BLOOD RIGHT FOREARM  Final   Special Requests   Final    BOTTLES DRAWN AEROBIC AND ANAEROBIC Blood Culture adequate volume   Culture   Final    NO GROWTH 2 DAYS Performed at [facility], [address] [place], [place] [address]    Report Status PENDING  Incomplete  Gastrointestinal Panel by PCR , Stool     Status: Abnormal  Collection Time: 12/13/20  9:40 AM   Specimen: Stool  Result Value Ref Range Status   Campylobacter species NOT DETECTED NOT DETECTED Final   Plesimonas shigelloides NOT DETECTED NOT DETECTED Final   Salmonella species NOT DETECTED NOT DETECTED Final   Yersinia enterocolitica NOT DETECTED NOT DETECTED Final   Vibrio species NOT DETECTED NOT DETECTED Final   Vibrio cholerae NOT DETECTED NOT DETECTED Final   Enteroaggregative E coli (EAEC) NOT DETECTED NOT DETECTED Final   Enteropathogenic E coli (EPEC) NOT DETECTED NOT DETECTED Final   Enterotoxigenic E coli (ETEC) NOT DETECTED NOT DETECTED Final   Shiga like toxin producing E coli (STEC) NOT DETECTED NOT DETECTED Final   Shigella/Enteroinvasive E coli (EIEC) NOT DETECTED NOT DETECTED Final   Cryptosporidium NOT DETECTED NOT DETECTED Final   Cyclospora cayetanensis NOT DETECTED NOT DETECTED Final   Entamoeba histolytica NOT DETECTED NOT DETECTED Final   Giardia lamblia NOT DETECTED NOT DETECTED Final   Adenovirus F40/41 DETECTED (A) NOT DETECTED Final   Astrovirus NOT DETECTED NOT DETECTED Final   Norovirus GI/GII NOT DETECTED NOT DETECTED Final   Rotavirus A NOT DETECTED NOT DETECTED Final   Sapovirus (I, II, IV, and V) NOT DETECTED NOT DETECTED Final    Comment: Performed at [facility], [address] [place], [place] [address]  C Difficile Quick Screen (NO PCR Reflex)     Status: Abnormal   Collection Time: 12/13/20  9:40 AM   Specimen: STOOL  Result Value Ref Range Status   C Diff antigen POSITIVE (A) NEGATIVE Final   C Diff toxin NEGATIVE NEGATIVE Final   C Diff interpretation   Final    Results are indeterminate. Please contact the provider listed for your campus for C diff questions in [place].    Comment: Performed at [facility], [address]  [place], [place] [address]  C. Diff by PCR, Reflexed     Status: None   Collection Time: 12/13/20  9:40 AM  Result Value Ref Range Status   Toxigenic C. Difficile by PCR NEGATIVE NEGATIVE Final    Comment: Patient is colonized with non toxigenic C. difficile. May not need treatment unless significant symptoms are present. Performed at [facility], [address] [place], [place] [address]      Studies: CT Abdomen Pelvis Wo Contrast  Result Date: 12/12/2020 CLINICAL DATA:  Colitis, sepsis, worsening symptoms, weaker; history hypertension, end-stage renal disease on dialysis, type II diabetes mellitus EXAM: CT ABDOMEN AND PELVIS WITHOUT CONTRAST TECHNIQUE: Multidetector CT imaging of the abdomen and pelvis was performed following the standard protocol without IV contrast. Sagittal and coronal MPR images reconstructed from axial data set. No oral contrast administered. COMPARISON:  12/05/2020 FINDINGS: Lower chest: Lung bases clear Hepatobiliary: Gallbladder surgically absent. No focal hepatic masses. Mild intrahepatic and extrahepatic biliary dilatation. Pancreas: Normal appearance Spleen: Normal appearance Adrenals/Urinary Tract: Bladder decompressed. Adrenal gland thickening without discrete mass. Small BILATERAL renal cysts, suboptimally characterized. Additional hyperdense lesion at inferior pole LEFT kidney 10 mm diameter unchanged. Stomach/Bowel: Short segment of normal appendix visualized. Diffuse colonic wall thickening involving the ascending and descending colon consistent with colitis. Distal sigmoid anastomotic staple line. Tiny hiatal hernia. Stomach otherwise unremarkable. Suboptimal assessment of small-bowel wall thickness due to lack of IV and oral contrast but question diffusely thickened Vascular/Lymphatic: Extensive atherosclerotic calcifications involving coronary arteries, aorta, iliac arteries, visceral arteries. Dilatation of distal abdominal aorta 2.6 cm diameter versus 1.5  cm diameter proximal diameter. Displaced intimal calcifications consistent  with chronic dissection. Reproductive: Atrophic uterus.  Unremarkable adnexa Other: Scattered ascites throughout abdomen and pelvis. Peritoneal dialysis catheter present. No free air. No hernia. Musculoskeletal: Degenerative disc disease changes L4-L5. IMPRESSION: Scattered colonic wall thickening involving the ascending and descending colon as well as splenic flexure and questionably small bowel loops; this could reflect inflammatory bowel disease or infection, ischemia considered less likely but not entirely excluded. Little change in bowel wall thickening since previous study. Peritoneal dialysis catheter with scattered ascites. Mild intrahepatic and extrahepatic biliary dilatation, likely related to prior cholecystectomy, recommend correlation with LFTs. Tiny hiatal hernia. 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 This recommendation follows ACR consensus guidelines: White Paper of the ACR Incidental Findings Committee II on Vascular Findings. J Am Coll Radiol 2013; 10:789-794. Aortic Atherosclerosis (ICD10-I70.0). Electronically Signed   By: Lavonia Dana M.D.   On: 12/12/2020 15:05   DG Chest Portable 1 View  Result Date: 12/12/2020 CLINICAL DATA:  80 year old female with nausea and vomiting. EXAM: PORTABLE CHEST 1 VIEW COMPARISON:  Chest radiograph dated 12/11/2019 FINDINGS: Background of emphysema. There is minimal eventration of the left hemidiaphragm with probable minimal left lung base atelectasis. Overall interval improvement in aeration of the lungs compared to prior radiograph. No focal consolidation, pleural effusion or pneumothorax. The cardiac silhouette is within limits. Atherosclerotic calcification of the  aorta. No acute osseous pathology. IMPRESSION: 1. No acute cardiopulmonary process. 2. Emphysema. Electronically Signed   By: Anner Crete M.D.   On: 12/12/2020 16:45    Scheduled Meds: . aspirin  81 mg Oral Daily  . atorvastatin  40 mg Oral QHS  . calcium acetate  1,334 mg Oral TID WC  . Chlorhexidine Gluconate Cloth  6 each Topical Daily  . escitalopram  10 mg Oral Daily  . fluticasone furoate-vilanterol  1 puff Inhalation Daily   And  . umeclidinium bromide  1 puff Inhalation Daily  . gentamicin cream  1 application Topical Daily  . heparin  5,000 Units Subcutaneous Q8H  . insulin aspart  0-5 Units Subcutaneous QHS  . insulin aspart  0-6 Units Subcutaneous TID WC  . mirtazapine  7.5 mg Oral QHS  . multivitamin  1 tablet Oral Daily  . pantoprazole  40 mg Oral Daily  . potassium chloride  20 mEq Oral Daily   Continuous Infusions: . sodium chloride Stopped (12/13/20 1220)  . cefTRIAXone (ROCEPHIN)  IV Stopped (12/13/20 2342)  . dialysis solution 1.5% low-MG/low-CA    . lactated ringers 75 mL/hr at 12/14/20 0304  . metronidazole 500 mg (12/14/20 0657)    Assessment/Plan:  1. Acute colitis with leukocytosis and lactic acidosis. Initially we started the patient on empiric Flagyl and Rocephin. Today's abdominal pain is worse today. I asked general surgery Dr. Windell Moment to see the patient. 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 Case discussed again with Dr. Windell Moment General surgery and vascular surgery Dr. Lorenso Courier. Change antibiotics over to Zosyn. Stool for C. difficile negative. Adenovirus on stool culture. Patient still has lactic acidosis. Will likely need angiogram. GI also following. General surgery wanted to give empiric vancomycin just in case this is C. difficile. 2. Leukemoid reaction with elevated white  blood cell count.  White blood cell count trending better today. 3. Relative hypotension. Holding Norvasc and continuing IV fluids. 4. Less likely pancreatitis with CT scan not showing any inflammation of the pancreas. 5. Type 2 diabetes mellitus with end-stage renal disease. Holding Lantus while n.p.o. Peritoneal dialysis as per nephrology. 6. COPD on Trelegy inhaler 7. Acute metabolic encephalopathy. Could be secondary to infection. Sometimes I see this with dialysis patients with Rocephin will change Rocephin over to Zosyn. 8. Hyperlipidemia unspecified on Lipitor if able to take 9. Anxiety depression on Lexapro if able to take 10. Hyponatremia, hypokalemia and hypomagnesemia. Peritoneal dialysis to help out with electrolytes    Code Status:     Code Status Orders  (From admission, onward)         Start     Ordered   12/12/20 1739  Full code  Continuous        12/12/20 1739        Code Status History    Date Active Date Inactive Code Status Order ID Comments User Context   12/05/2020 1411 12/08/2020 1612 Full Code [id number]  Ivor Costa, MD Inpatient   12/11/2019 1227 12/14/2019 1844 Full Code [id number]  Ivor Costa, MD ED   07/27/2019 2253 07/30/2019 1856 Full Code [id number]  Epifanio Lesches, MD Inpatient   Advance Care Planning Activity     Family Communication: Spoke with daughter on the phone. Left a message for daughter this afternoon Disposition Plan: Status is: Inpatient  Dispo: The patient is from: Home              Anticipated d/c is to: To be determined              Anticipated d/c date is: Likely will need another for 5 days here in the hospital              Patient currently not medically stable for disposition with worsening abdominal pain.   Difficult to place patient. Hopefully not  Consultants:  Nephrology  Gastroenterology  General surgery  Vascular surgery  Antibiotics:  Antibiotics changed to Zosyn  Time spent: 35 minutes, case discussed with nephrology,  vascular surgery and general surgery  [place]

## 2020-12-14 NOTE — H&P (View-Only) (Signed)
[facility] Vascular Consult Note  MRN : [id number]  Anita Wells is a 80 y.o. (1941-01-30) female who presents with chief complaint of  Chief Complaint  Patient presents with  . Abdominal Pain  .  History of Present Illness: Patient with history of tobacco use, ESRD on peritoneal dialysis, hypertension, hyperlipidemia, diabetes mellitus,renal artery stenosis, anemia, bilateral carotid artery stenosis, diverticulitis. Patient was recently admitted on 12/05/20 and discharged on 12/08/20 for similar complaints. CT showed colitis and patient treated for presumed sepsis from colitis, improved with tolerating po diet. Patient was discharged on 2/14 po antibiotics. Per daughter at bedside her symptoms did not improve. Readmitted With  nausea, vomiting, diarrhea and abdominal pain, lethargy. Upon exam- lethargic.  Current Facility-Administered Medications  Medication Dose Route Frequency Provider Last Rate Last Admin  . 0.9 %  sodium chloride infusion   Intravenous PRN Loletha Grayer, MD   Stopped at 12/13/20 1220  . acetaminophen (TYLENOL) tablet 650 mg  650 mg Oral Q6H PRN Loletha Grayer, MD   650 mg at 12/13/20 [id number]   Or  . acetaminophen (TYLENOL) suppository 650 mg  650 mg Rectal Q6H PRN Wieting, Richard, MD      . albuterol (VENTOLIN HFA) 108 (90 Base) MCG/ACT inhaler 2 puff  2 puff Inhalation Q6H PRN Wieting, Richard, MD      . aspirin chewable tablet 81 mg  81 mg Oral Daily Loletha Grayer, MD   81 mg at 12/13/20 0929  . Chlorhexidine Gluconate Cloth 2 % PADS 6 each  6 each Topical Daily Wieting, Richard, MD      . dialysis solution 1.5% low-MG/low-CA dianeal solution   Intraperitoneal Q24H Candiss Norse, Harmeet, MD      . escitalopram (LEXAPRO) tablet 10 mg  10 mg Oral Daily Loletha Grayer, MD   10 mg at 12/13/20 0952  . fluticasone furoate-vilanterol (BREO ELLIPTA) 100-25 MCG/INH 1 puff  1 puff Inhalation Daily Hallaji, Sheema M, RPH       And  . umeclidinium  bromide (INCRUSE ELLIPTA) 62.5 MCG/INH 1 puff  1 puff Inhalation Daily Hallaji, Sheema M, RPH      . gentamicin cream (GARAMYCIN) 0.1 % 1 application  1 application Topical Daily Murlean Iba, MD   1 application at 0000000 1517  . heparin 1000 unit/ml injection 500 Units  500 Units Intraperitoneal PRN Murlean Iba, MD      . heparin injection 5,000 Units  5,000 Units Subcutaneous Q8H Loletha Grayer, MD   5,000 Units at 12/14/20 0654  . insulin aspart (novoLOG) injection 0-5 Units  0-5 Units Subcutaneous QHS Wieting, Richard, MD      . insulin aspart (novoLOG) injection 0-6 Units  0-6 Units Subcutaneous TID WC Loletha Grayer, MD      . lactated ringers infusion   Intravenous Continuous Murlean Iba, MD 75 mL/hr at 12/14/20 0304 Infusion Verify at 12/14/20 0304  . mirtazapine (REMERON) tablet 7.5 mg  7.5 mg Oral QHS Loletha Grayer, MD   7.5 mg at 12/13/20 2303  . morphine 2 MG/ML injection 1 mg  1 mg Intravenous Q4H PRN Wieting, Richard, MD      . ondansetron [facility]) tablet 4 mg  4 mg Oral Q6H PRN Wieting, Richard, MD       Or  . ondansetron (ZOFRAN) injection 4 mg  4 mg Intravenous Q6H PRN Wieting, Richard, MD      . oxyCODONE-acetaminophen (PERCOCET/ROXICET) 5-325 MG per tablet 1 tablet  1 tablet Oral Q8H PRN Loletha Grayer, MD      .  pantoprazole (PROTONIX) injection 40 mg  40 mg Intravenous Q24H Loletha Grayer, MD   40 mg at 12/14/20 1500  . piperacillin-tazobactam (ZOSYN) IVPB 2.25 g  2.25 g Intravenous Q8H Rauer, Samantha O, RPH 100 mL/hr at 12/14/20 1508 2.25 g at 12/14/20 1508  . potassium chloride SA (KLOR-CON) CR tablet 20 mEq  20 mEq Oral Daily Wieting, Richard, MD      . vancomycin (VANCOCIN) 50 mg/mL oral solution 125 mg  125 mg Per Tube QID Loletha Grayer, MD        Past Medical History:  Diagnosis Date  . Anemia   . Barrett's esophagus   . Carotid stenosis, bilateral   . Chronic kidney disease   . GERD (gastroesophageal reflux disease)   . Hyperlipidemia    . Hyperparathyroidism ([place])   . Hypertension   . Renal artery stenosis ([facility])    left    Past Surgical History:  Procedure Laterality Date  . A/V FISTULAGRAM Left 02/19/2019   Procedure: A/V FISTULAGRAM;  Surgeon: Algernon Huxley, MD;  Location: [place] [facility];  Service: Cardiovascular;  Laterality: Left;  . AV FISTULA PLACEMENT  12/21/2018   Procedure: ARTERIOVENOUS (AV) FISTULA CREATION;  Surgeon: Algernon Huxley, MD;  Location: [facility];  Service: Vascular;;  . COLON RESECTION    . COLON SURGERY    . colonoscpy    . DIALYSIS/PERMA CATHETER INSERTION N/A 11/15/2018   Procedure: DIALYSIS/PERMA CATHETER INSERTION;  Surgeon: Algernon Huxley, MD;  Location: [place] [facility];  Service: Cardiovascular;  Laterality: N/A;  . DIALYSIS/PERMA CATHETER REMOVAL N/A 09/27/2019   Procedure: DIALYSIS/PERMA CATHETER REMOVAL;  Surgeon: Algernon Huxley, MD;  Location: [place] [facility];  Service: Cardiovascular;  Laterality: N/A;  . RENAL ARTERY STENT Left     Social History Social History   Tobacco Use  . Smoking status: Current Every Day Smoker    Packs/day: 0.25    Years: 50.00    Pack years: 12.50    Types: Cigarettes  . Smokeless tobacco: Never Used  . Tobacco comment: She states she is ready to quit 09/15/2020.  Vaping Use  . Vaping Use: Never used  Substance Use Topics  . Alcohol use: Not Currently  . Drug use: Never    Family History Family History  Problem Relation Age of Onset  . Diabetes Mellitus II Mother   . Hypertension Mother   . Alcohol abuse Father     Allergies  Allergen Reactions  . Clopidogrel Swelling    Mouth swelling  . Ace Inhibitors Cough  . Amlodipine Other (See Comments)    Peripheral edema (tolerates low dose)     REVIEW OF SYSTEMS (Negative unless checked) Per record and daughter at bedside Constitutional: '[]'$ Weight loss  '[]'$ Fever  '[]'$ Chills Cardiac: '[]'$ Chest pain   '[]'$ Chest pressure   '[]'$ Palpitations   '[]'$ Shortness of breath when laying flat    '[]'$ Shortness of breath at rest   '[]'$ Shortness of breath with exertion. Vascular:  '[x]'$ Pain in legs with walking   '[]'$ Pain in legs at rest   '[]'$ Pain in legs when laying flat   '[]'$ Claudication   '[]'$ Pain in feet when walking  '[]'$ Pain in feet at rest  '[]'$ Pain in feet when laying flat   '[]'$ History of DVT   '[]'$ Phlebitis   '[]'$ Swelling in legs   '[]'$ Varicose veins   '[]'$ Non-healing ulcers Pulmonary:   '[]'$ Uses home oxygen   '[]'$ Productive cough   '[]'$ Hemoptysis   '[]'$ Wheeze  '[]'$ COPD   '[]'$ Asthma Neurologic:  '[]'$ Dizziness  '[]'$ Blackouts   '[]'$   Seizures   '[]'$ History of stroke   '[]'$ History of TIA  '[]'$ Aphasia   '[]'$ Temporary blindness   '[]'$ Dysphagia   '[]'$ Weakness or numbness in arms   '[]'$ Weakness or numbness in legs Musculoskeletal:  '[]'$ Arthritis   '[]'$ Joint swelling   '[]'$ Joint pain   '[]'$ Low back pain Hematologic:  '[]'$ Easy bruising  '[]'$ Easy bleeding   '[]'$ Hypercoagulable state   '[]'$ Anemic  '[]'$ Hepatitis Gastrointestinal:  '[x]'$ Blood in stool   '[]'$ Vomiting blood  '[x]'$ Gastroesophageal reflux/heartburn   '[]'$ Difficulty swallowing. Genitourinary:  '[x]'$ Chronic kidney disease   '[]'$ Difficult urination  '[]'$ Frequent urination  '[]'$ Burning with urination   '[]'$ Blood in urine Skin:  '[]'$ Rashes   '[]'$ Ulcers   '[]'$ Wounds Psychological:  '[]'$ History of anxiety   '[]'$  History of major depression.  Physical Examination  Vitals:   12/13/20 2054 12/14/20 0030 12/14/20 0507 12/14/20 1114  BP: (!) 100/57 101/64 104/65 108/66  Pulse: (!) 104 (!) 103 (!) 105 (!) 105  Resp: '18 16 20 16  '$ Temp: 97.7 F (36.5 C)  (!) 97 F (36.1 C) 98 F (36.7 C)  TempSrc: Oral  Oral Oral  SpO2: 100% 96% 95% 98%  Weight:      Height:       Body mass index is 19.37 kg/m. Gen:  WD/WN, NAD, lethargic Head: [place]/AT, No temporalis wasting. Prominent temp pulse not noted. Eyes: Sclera non-icteric, conjunctiva clear Neck: Trachea midline.  No JVD.  Pulmonary:  Good air movement, respirations not labored, equal bilaterally.  Cardiac: RRR, normal S1, S2. Vascular: Palpable radial, femoral pulses, extremities  warm Gastrointestinal: soft, tender, distended. +guarding Musculoskeletal: M/S 5/5 throughout.  Extremities without ischemic changes.  No deformity or atrophy. No edema.       CBC Lab Results  Component Value Date   WBC 38.6 (H) 12/14/2020   HGB 11.1 (L) 12/14/2020   HCT 34.1 (L) 12/14/2020   MCV 92.2 12/14/2020   PLT 183 12/14/2020    BMET    Component Value Date/Time   NA 129 (L) 12/14/2020 0458   K 3.0 (L) 12/14/2020 0458   CL 93 (L) 12/14/2020 0458   CO2 20 (L) 12/14/2020 0458   GLUCOSE 212 (H) 12/14/2020 0458   BUN 37 (H) 12/14/2020 0458   CREATININE 7.57 (H) 12/14/2020 0458   CALCIUM 7.9 (L) 12/14/2020 0458   GFRNONAA 5 (L) 12/14/2020 0458   GFRAA 6 (L) 12/14/2019 0513   Estimated Creatinine Clearance: 5 mL/min (A) (by C-G formula based on SCr of 7.57 mg/dL (H)).  COAG Lab Results  Component Value Date   INR 0.8 12/18/2018    Radiology CT Abdomen Pelvis Wo Contrast  Result Date: 12/12/2020 CLINICAL DATA:  Colitis, sepsis, worsening symptoms, weaker; history hypertension, end-stage renal disease on dialysis, type II diabetes mellitus EXAM: CT ABDOMEN AND PELVIS WITHOUT CONTRAST TECHNIQUE: Multidetector CT imaging of the abdomen and pelvis was performed following the standard protocol without IV contrast. Sagittal and coronal MPR images reconstructed from axial data set. No oral contrast administered. COMPARISON:  12/05/2020 FINDINGS: Lower chest: Lung bases clear Hepatobiliary: Gallbladder surgically absent. No focal hepatic masses. Mild intrahepatic and extrahepatic biliary dilatation. Pancreas: Normal appearance Spleen: Normal appearance Adrenals/Urinary Tract: Bladder decompressed. Adrenal gland thickening without discrete mass. Small BILATERAL renal cysts, suboptimally characterized. Additional hyperdense lesion at inferior pole LEFT kidney 10 mm diameter unchanged. Stomach/Bowel: Short segment of normal appendix visualized. Diffuse colonic wall thickening  involving the ascending and descending colon consistent with colitis. Distal sigmoid anastomotic staple line. Tiny hiatal hernia. Stomach otherwise unremarkable. Suboptimal assessment of small-bowel wall thickness  due to lack of IV and oral contrast but question diffusely thickened Vascular/Lymphatic: Extensive atherosclerotic calcifications involving coronary arteries, aorta, iliac arteries, visceral arteries. Dilatation of distal abdominal aorta 2.6 cm diameter versus 1.5 cm diameter proximal diameter. Displaced intimal calcifications consistent with chronic dissection. Reproductive: Atrophic uterus.  Unremarkable adnexa Other: Scattered ascites throughout abdomen and pelvis. Peritoneal dialysis catheter present. No free air. No hernia. Musculoskeletal: Degenerative disc disease changes L4-L5. IMPRESSION: Scattered colonic wall thickening involving the ascending and descending colon as well as splenic flexure and questionably small bowel loops; this could reflect inflammatory bowel disease or infection, ischemia considered less likely but not entirely excluded. Little change in bowel wall thickening since previous study. Peritoneal dialysis catheter with scattered ascites. Mild intrahepatic and extrahepatic biliary dilatation, likely related to prior cholecystectomy, recommend correlation with LFTs. Tiny hiatal hernia. 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 This recommendation follows ACR consensus guidelines: White Paper of the ACR Incidental Findings Committee II on Vascular Findings. J Am Coll Radiol 2013; 10:789-794. Aortic Atherosclerosis (ICD10-I70.0). Electronically Signed   By: Lavonia Dana M.D.   On: 12/12/2020 15:05   CT ABDOMEN PELVIS WO CONTRAST  Result Date: 12/05/2020 CLINICAL DATA:   Left lower quadrant pain and diarrhea EXAM: CT ABDOMEN AND PELVIS WITHOUT CONTRAST TECHNIQUE: Multidetector CT imaging of the abdomen and pelvis was performed following the standard protocol without IV contrast. COMPARISON:  10/17/2008 FINDINGS: Lower chest: Lung bases show no acute abnormality. Emphysematous changes are seen. Hepatobiliary: Gallbladder has been surgically removed. Liver is within normal limits. Pancreas: Unremarkable. No pancreatic ductal dilatation or surrounding inflammatory changes. Spleen: Normal in size without focal abnormality. Adrenals/Urinary Tract: Adrenal glands are unremarkable. Kidneys demonstrate bilateral cystic change as well as nonobstructing calculi bilaterally. These lie predominately in the lower pole measuring approximately 4 mm bilaterally. No obstructive changes are seen. The bladder is decompressed. Stomach/Bowel: Postsurgical changes are noted in the rectosigmoid region. No obstructive changes are seen. Mild wall thickening is noted within the distal transverse and proximal descending colon likely representing some focal colitis. The more proximal colon is within normal limits. The appendix is unremarkable. No small bowel abnormality is seen. The stomach is decompressed. Vascular/Lymphatic: Atherosclerotic changes of the abdominal aorta are noted. Mild prominence of the distal descending thoracic aorta is noted at 3 cm with changes of prior chronic dissection. Similar findings are noted in the distal infrarenal aorta measuring up to 2.5 cm. No aneurysmal dilatation is seen. No sizable adenopathy is noted. Reproductive: Uterus and bilateral adnexa are unremarkable. Other: Free fluid is noted within the abdomen and pelvis consistent with the known peritoneal dialysis catheter. Catheter is noted deep in the pelvis. No other focal abnormality is noted. Musculoskeletal: Degenerative changes of lumbar spine are seen. No acute bony abnormality is noted. IMPRESSION: Mild thickening  in the distal transverse and proximal descending colon consistent with focal colitis. No abscess or perforation is noted. Considerable free fluid within the abdomen consistent with peritoneal dialysis. Bilateral nonobstructing renal calculi. Diffuse chronic atherosclerotic changes in the distal descending thoracic aorta and infrarenal abdominal aorta without aneurysmal dilatation. Electronically Signed   By: Inez Catalina M.D.   On: 12/05/2020 09:49   DG Chest Portable 1 View  Result Date: 12/12/2020 CLINICAL DATA:  80 year old female with nausea and vomiting. EXAM: PORTABLE CHEST 1 VIEW COMPARISON:  Chest radiograph dated 12/11/2019 FINDINGS: Background of emphysema. There is minimal eventration of the left hemidiaphragm with probable minimal left lung base atelectasis. Overall interval improvement in aeration of the lungs compared to prior radiograph. No focal consolidation, pleural effusion or pneumothorax. The cardiac silhouette is within limits. Atherosclerotic calcification of the aorta. No acute osseous pathology. IMPRESSION: 1. No acute cardiopulmonary process. 2. Emphysema. Electronically Signed   By: Anner Crete M.D.   On: 12/12/2020 16:45   CT Angio Abd/Pel w/ and/or w/o  Addendum Date: 12/14/2020   ADDENDUM REPORT: 12/14/2020 13:47 ADDENDUM: Addendum created after discussing results with surgical team, Dr. Peyton Najjar. Dilated loops of distal small bowel without evidence of bowel obstruction/transition. In addition, there is redemonstration of mild wall thickening of transverse colon, without distension or abnormal enhancement. Either of these findings may be seen with ileus or nonspecific enteritis/colitis. Electronically Signed   By: Corrie Mckusick D.O.   On: 12/14/2020 13:47   Result Date: 12/14/2020 CLINICAL DATA:  80 year old female with possible mesenteric ischemia EXAM: CTA ABDOMEN AND PELVIS WITHOUT AND WITH CONTRAST TECHNIQUE: Multidetector CT imaging of the abdomen and pelvis was  performed using the standard protocol during bolus administration of intravenous contrast. Multiplanar reconstructed images and MIPs were obtained and reviewed to evaluate the vascular anatomy. CONTRAST:  28m OMNIPAQUE IOHEXOL 350 MG/ML SOLN COMPARISON:  Noncontrast CT comparison 12/12/2020, 12/05/2020 FINDINGS: VASCULAR Aorta: Moderate to advanced atherosclerotic changes of the distal thoracic aorta. Diameter at the hiatus measures 2.4 cm. No dissection flap identified. Infrarenal abdominal aortic aneurysm measuring 2.5 cm at the IMA takeoff. Smallest diameter of the infrarenal abdominal aorta 14 mm more proximally below the renal arteries. Moderate mixed calcified and soft plaque of the abdominal aorta without evidence of high-grade stenosis. Celiac: Atherosclerotic changes at the origin of the celiac artery. 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 There is then reconstitution of the celiac artery secondary to collateral flow with patency of the left gastric artery, splenic artery, common hepatic artery. SMA: Atherosclerotic changes at the origin of the SMA, with mixed calcified and soft plaque. Estimated 50% narrowing at the origin. Arcades are patent. Renals: - Right: Atherosclerotic changes at the origin of the right renal artery without evidence of high-grade stenosis. - Left: Patent stent at the origin of the left renal artery. The metallic artifact limits the study for evaluation of in stent stenosis or edge stenosis. Artery is patent distal to the stent, small caliber. IMA: While the IMA is favored to be patent at the origin though likely stenotic given the degree of atherosclerotic plaque of the aorta and IMA. Left colic artery is patent as well as the proximal superior rectal artery. Right lower extremity: Moderate atherosclerotic changes of the right iliac arterial system with mixed calcified and soft plaque throughout the length of the common iliac  artery and external iliac artery. No high-grade stenosis or occlusion identified. Hypogastric artery appears occluded at the origin secondary to calcified plaque. No aneurysm. Moderate atherosclerotic changes of the right common femoral artery. Proximal profunda femoris and SFA patent. Left lower extremity: Moderate atherosclerotic changes of the left iliac arterial system with mixed calcified and soft plaque through the length of the common iliac artery, external iliac artery. No high-grade stenosis or occlusion. Calcified plaque at the origin of the hypogastric artery somewhat limits the evaluation, however, hypogastric appears patent. Mild to moderate atherosclerotic changes of the common femoral artery. There is a proximal origin  of the lateral circumflex femoral artery. Proximal SFA and profunda femoris are patent. Veins: Unremarkable appearance of the venous system. Review of the MIP images confirms the above findings. NON-VASCULAR Lower chest: No acute finding of the lower chest. Atelectatic changes at the left greater than right lung base/scarring. Emphysema. Hepatobiliary: Unremarkable appearance of liver. Cholecystectomy. Similar appearance of mildly dilated bile ducts in the hilum of the liver, unchanged from the noncontrast CT studies. No radiopaque stones. No inflammatory changes at the head of the pancreas. Pancreas: Unremarkable Spleen: Unremarkable Adrenals/Urinary Tract: - Right adrenal gland: Unremarkable - Left adrenal gland: Unremarkable. - Right kidney: No hydronephrosis. Punctate calcifications in the hilum of the kidney, either nonobstructive stones or vascular calcifications. Unremarkable right ureter. Multiple subcentimeter low-density rounded lesions, potentially cysts. - Left Kidney: No hydronephrosis. Vascular calcifications in the hilum of the kidney. Unremarkable left ureter. Rounded lesion on the lateral cortex of the left kidney and rounded lesion on the inferior cortex of the left  kidney measuring less than 2 cm each. Both of these appear to be complex cysts given their lack of dynamic enhancement. - Urinary Bladder: Urinary bladder is decompressed. Stomach/Bowel: - Stomach: Unremarkable. - Small bowel: Proximal small bowel decompressed. Distal small bowel demonstrates borderline dilation with fluid. No transition point identified. The length of small bowel demonstrates transmural enhancement, with no segment of small bowel demonstrating differential hypoattenuation/lack of enhancement. Terminal ileum is partially fecalized, without distension. - Appendix: Normal appendix. - Colon: The majority of colon demonstrates relative enhancement similar to that of the small bowel, with the appearance of transmural enhancement maintained throughout its length. There are no segments of bowel demonstrating focal wall thickening/edema. Surgical changes of a colonic anastomosis, rectosigmoid junction. Lymphatic: No adenopathy. Mesenteric: Peritoneal dialysis catheter terminates within the low abdomen/pelvis. Small volume of dialysate. Reproductive: Small volume fluid/tissue within the endometrial canal, unusual for patient of this age. Other: Surgical changes along the midline.  No hernia. Musculoskeletal: No acute displaced fracture. No bony canal narrowing. Degenerative changes of the spine. No aggressive sclerotic lesions. Lucent lesion on the right aspect of the L2 vertebral body present in 2009 and unchanged. 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 Reconstitution of the vessel from collateral flow. -estimated 50% narrowing of the SMA origin secondary to calcified and soft plaque. Distal vessels patent. -at least high-grade stenosis of the IMA origin secondary to atherosclerotic plaque. Left colic artery and the proximal superior rectal artery opacify. These findings support the possibility of at least chronic  mesenteric ischemia. The length of small bowel and colon demonstrate transmural enhancement, with no evidence of ischemic bowel. Dilated loops of distal small bowel without evidence of bowel obstruction/transition. This may be seen with ileus or nonspecific enteritis. 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 Stent appears patent with the metallic artifact limiting evaluation for any in-stent stenosis/edge stenosis. **An incidental finding of potential clinical significance has been found. Ill-defined soft tissue/fluid within the endometrial canal, which is unexpected in a patient of this age. Referral for gynecologic follow-up recommended, did determine the need for any biopsy and/or MRI as uterine malignancy not excluded** . Additional ancillary findings as above. Signed, Dulcy Fanny. Dellia Nims, [facility] Electronically Signed: By: Corrie Mckusick D.O. On: 12/14/2020 13:26      Assessment/Plan 1. Colitis- possible C. Diff. Possible SBP from peritoneal Dialysis however, recent cultures negative 2. 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 Celiac occlusion with reconstitution, SMA ~50%, IMA- moderate stenosis. 3. As the patient has had recurrent symptoms despite ABX and supportive care, recommend Mesenteric Angiogram to fully evaluate and intervene if needed. 4. Discussed plan with daughter at bedside. Will Plan for tomorrow per Dr. Lucky Cowboy.   Evaristo Bury, MD  12/14/2020 4:24 PM    This note was created with Dragon medical transcription system.  Any error is purely unintentional

## 2020-12-14 NOTE — Consult Note (Signed)
[facility] Vascular Consult Note  MRN : [id number]  Anita Wells is a 80 y.o. ([birth year]) female who presents with chief complaint of  Chief Complaint  Patient presents with  . Abdominal Pain  .  History of Present Illness: Patient with history of tobacco use, ESRD on peritoneal dialysis, hypertension, hyperlipidemia, diabetes mellitus,renal artery stenosis, anemia, bilateral carotid artery stenosis, diverticulitis. Patient was recently admitted on 12/05/20 and discharged on 12/08/20 for similar complaints. CT showed colitis and patient treated for presumed sepsis from colitis, improved with tolerating po diet. Patient was discharged on 2/14 po antibiotics. Per daughter at bedside her symptoms did not improve. Readmitted With  nausea, vomiting, diarrhea and abdominal pain, lethargy. Upon exam- lethargic.  Current Facility-Administered Medications  Medication Dose Route Frequency Provider Last Rate Last Admin  . 0.9 %  sodium chloride infusion   Intravenous PRN Loletha Grayer, MD   Stopped at 12/13/20 1220  . acetaminophen (TYLENOL) tablet 650 mg  650 mg Oral Q6H PRN Loletha Grayer, MD   650 mg at 12/13/20 [id number]   Or  . acetaminophen (TYLENOL) suppository 650 mg  650 mg Rectal Q6H PRN Wieting, Richard, MD      . albuterol (VENTOLIN HFA) 108 (90 Base) MCG/ACT inhaler 2 puff  2 puff Inhalation Q6H PRN Wieting, Richard, MD      . aspirin chewable tablet 81 mg  81 mg Oral Daily Loletha Grayer, MD   81 mg at 12/13/20 0929  . Chlorhexidine Gluconate Cloth 2 % PADS 6 each  6 each Topical Daily Wieting, Richard, MD      . dialysis solution 1.5% low-MG/low-CA dianeal solution   Intraperitoneal Q24H Candiss Norse, Harmeet, MD      . escitalopram (LEXAPRO) tablet 10 mg  10 mg Oral Daily Loletha Grayer, MD   10 mg at 12/13/20 0952  . fluticasone furoate-vilanterol (BREO ELLIPTA) 100-25 MCG/INH 1 puff  1 puff Inhalation Daily Hallaji, Sheema M, RPH       And  . umeclidinium  bromide (INCRUSE ELLIPTA) 62.5 MCG/INH 1 puff  1 puff Inhalation Daily Hallaji, Sheema M, RPH      . gentamicin cream (GARAMYCIN) 0.1 % 1 application  1 application Topical Daily Murlean Iba, MD   1 application at 0000000 1517  . heparin 1000 unit/ml injection 500 Units  500 Units Intraperitoneal PRN Murlean Iba, MD      . heparin injection 5,000 Units  5,000 Units Subcutaneous Q8H Loletha Grayer, MD   5,000 Units at 12/14/20 0654  . insulin aspart (novoLOG) injection 0-5 Units  0-5 Units Subcutaneous QHS Wieting, Richard, MD      . insulin aspart (novoLOG) injection 0-6 Units  0-6 Units Subcutaneous TID WC Loletha Grayer, MD      . lactated ringers infusion   Intravenous Continuous Murlean Iba, MD 75 mL/hr at 12/14/20 0304 Infusion Verify at 12/14/20 0304  . mirtazapine (REMERON) tablet 7.5 mg  7.5 mg Oral QHS Loletha Grayer, MD   7.5 mg at 12/13/20 2303  . morphine 2 MG/ML injection 1 mg  1 mg Intravenous Q4H PRN Wieting, Richard, MD      . ondansetron [facility]) tablet 4 mg  4 mg Oral Q6H PRN Wieting, Richard, MD       Or  . ondansetron (ZOFRAN) injection 4 mg  4 mg Intravenous Q6H PRN Wieting, Richard, MD      . oxyCODONE-acetaminophen (PERCOCET/ROXICET) 5-325 MG per tablet 1 tablet  1 tablet Oral Q8H PRN Loletha Grayer, MD      .  pantoprazole (PROTONIX) injection 40 mg  40 mg Intravenous Q24H Loletha Grayer, MD   40 mg at 12/14/20 1500  . piperacillin-tazobactam (ZOSYN) IVPB 2.25 g  2.25 g Intravenous Q8H Rauer, Samantha O, RPH 100 mL/hr at 12/14/20 1508 2.25 g at 12/14/20 1508  . potassium chloride SA (KLOR-CON) CR tablet 20 mEq  20 mEq Oral Daily Wieting, Richard, MD      . vancomycin (VANCOCIN) 50 mg/mL oral solution 125 mg  125 mg Per Tube QID Loletha Grayer, MD        Past Medical History:  Diagnosis Date  . Anemia   . Barrett's esophagus   . Carotid stenosis, bilateral   . Chronic kidney disease   . GERD (gastroesophageal reflux disease)   . Hyperlipidemia    . Hyperparathyroidism ([place])   . Hypertension   . Renal artery stenosis ([facility])    left    Past Surgical History:  Procedure Laterality Date  . A/V FISTULAGRAM Left 02/19/2019   Procedure: A/V FISTULAGRAM;  Surgeon: Algernon Huxley, MD;  Location: [place] [facility];  Service: Cardiovascular;  Laterality: Left;  . AV FISTULA PLACEMENT  12/21/2018   Procedure: ARTERIOVENOUS (AV) FISTULA CREATION;  Surgeon: Algernon Huxley, MD;  Location: [facility];  Service: Vascular;;  . COLON RESECTION    . COLON SURGERY    . colonoscpy    . DIALYSIS/PERMA CATHETER INSERTION N/A 11/15/2018   Procedure: DIALYSIS/PERMA CATHETER INSERTION;  Surgeon: Algernon Huxley, MD;  Location: [place] [facility];  Service: Cardiovascular;  Laterality: N/A;  . DIALYSIS/PERMA CATHETER REMOVAL N/A 09/27/2019   Procedure: DIALYSIS/PERMA CATHETER REMOVAL;  Surgeon: Algernon Huxley, MD;  Location: [place] [facility];  Service: Cardiovascular;  Laterality: N/A;  . RENAL ARTERY STENT Left     Social History Social History   Tobacco Use  . Smoking status: Current Every Day Smoker    Packs/day: 0.25    Years: 50.00    Pack years: 12.50    Types: Cigarettes  . Smokeless tobacco: Never Used  . Tobacco comment: She states she is ready to quit 09/15/2020.  Vaping Use  . Vaping Use: Never used  Substance Use Topics  . Alcohol use: Not Currently  . Drug use: Never    Family History Family History  Problem Relation Age of Onset  . Diabetes Mellitus II Mother   . Hypertension Mother   . Alcohol abuse Father     Allergies  Allergen Reactions  . Clopidogrel Swelling    Mouth swelling  . Ace Inhibitors Cough  . Amlodipine Other (See Comments)    Peripheral edema (tolerates low dose)     REVIEW OF SYSTEMS (Negative unless checked) Per record and daughter at bedside Constitutional: '[]'$ Weight loss  '[]'$ Fever  '[]'$ Chills Cardiac: '[]'$ Chest pain   '[]'$ Chest pressure   '[]'$ Palpitations   '[]'$ Shortness of breath when laying flat    '[]'$ Shortness of breath at rest   '[]'$ Shortness of breath with exertion. Vascular:  '[x]'$ Pain in legs with walking   '[]'$ Pain in legs at rest   '[]'$ Pain in legs when laying flat   '[]'$ Claudication   '[]'$ Pain in feet when walking  '[]'$ Pain in feet at rest  '[]'$ Pain in feet when laying flat   '[]'$ History of DVT   '[]'$ Phlebitis   '[]'$ Swelling in legs   '[]'$ Varicose veins   '[]'$ Non-healing ulcers Pulmonary:   '[]'$ Uses home oxygen   '[]'$ Productive cough   '[]'$ Hemoptysis   '[]'$ Wheeze  '[]'$ COPD   '[]'$ Asthma Neurologic:  '[]'$ Dizziness  '[]'$ Blackouts   '[]'$   Seizures   '[]'$ History of stroke   '[]'$ History of TIA  '[]'$ Aphasia   '[]'$ Temporary blindness   '[]'$ Dysphagia   '[]'$ Weakness or numbness in arms   '[]'$ Weakness or numbness in legs Musculoskeletal:  '[]'$ Arthritis   '[]'$ Joint swelling   '[]'$ Joint pain   '[]'$ Low back pain Hematologic:  '[]'$ Easy bruising  '[]'$ Easy bleeding   '[]'$ Hypercoagulable state   '[]'$ Anemic  '[]'$ Hepatitis Gastrointestinal:  '[x]'$ Blood in stool   '[]'$ Vomiting blood  '[x]'$ Gastroesophageal reflux/heartburn   '[]'$ Difficulty swallowing. Genitourinary:  '[x]'$ Chronic kidney disease   '[]'$ Difficult urination  '[]'$ Frequent urination  '[]'$ Burning with urination   '[]'$ Blood in urine Skin:  '[]'$ Rashes   '[]'$ Ulcers   '[]'$ Wounds Psychological:  '[]'$ History of anxiety   '[]'$  History of major depression.  Physical Examination  Vitals:   12/13/20 2054 12/14/20 0030 12/14/20 0507 12/14/20 1114  BP: (!) 100/57 101/64 104/65 108/66  Pulse: (!) 104 (!) 103 (!) 105 (!) 105  Resp: '18 16 20 16  '$ Temp: 97.7 F (36.5 C)  (!) 97 F (36.1 C) 98 F (36.7 C)  TempSrc: Oral  Oral Oral  SpO2: 100% 96% 95% 98%  Weight:      Height:       Body mass index is 19.37 kg/m. Gen:  WD/WN, NAD, lethargic Head: [place]/AT, No temporalis wasting. Prominent temp pulse not noted. Eyes: Sclera non-icteric, conjunctiva clear Neck: Trachea midline.  No JVD.  Pulmonary:  Good air movement, respirations not labored, equal bilaterally.  Cardiac: RRR, normal S1, S2. Vascular: Palpable radial, femoral pulses, extremities  warm Gastrointestinal: soft, tender, distended. +guarding Musculoskeletal: M/S 5/5 throughout.  Extremities without ischemic changes.  No deformity or atrophy. No edema.       CBC Lab Results  Component Value Date   WBC 38.6 (H) 12/14/2020   HGB 11.1 (L) 12/14/2020   HCT 34.1 (L) 12/14/2020   MCV 92.2 12/14/2020   PLT 183 12/14/2020    BMET    Component Value Date/Time   NA 129 (L) 12/14/2020 0458   K 3.0 (L) 12/14/2020 0458   CL 93 (L) 12/14/2020 0458   CO2 20 (L) 12/14/2020 0458   GLUCOSE 212 (H) 12/14/2020 0458   BUN 37 (H) 12/14/2020 0458   CREATININE 7.57 (H) 12/14/2020 0458   CALCIUM 7.9 (L) 12/14/2020 0458   GFRNONAA 5 (L) 12/14/2020 0458   GFRAA 6 (L) 12/14/2019 0513   Estimated Creatinine Clearance: 5 mL/min (A) (by C-G formula based on SCr of 7.57 mg/dL (H)).  COAG Lab Results  Component Value Date   INR 0.8 12/18/2018    Radiology CT Abdomen Pelvis Wo Contrast  Result Date: 12/12/2020 CLINICAL DATA:  Colitis, sepsis, worsening symptoms, weaker; history hypertension, end-stage renal disease on dialysis, type II diabetes mellitus EXAM: CT ABDOMEN AND PELVIS WITHOUT CONTRAST TECHNIQUE: Multidetector CT imaging of the abdomen and pelvis was performed following the standard protocol without IV contrast. Sagittal and coronal MPR images reconstructed from axial data set. No oral contrast administered. COMPARISON:  12/05/2020 FINDINGS: Lower chest: Lung bases clear Hepatobiliary: Gallbladder surgically absent. No focal hepatic masses. Mild intrahepatic and extrahepatic biliary dilatation. Pancreas: Normal appearance Spleen: Normal appearance Adrenals/Urinary Tract: Bladder decompressed. Adrenal gland thickening without discrete mass. Small BILATERAL renal cysts, suboptimally characterized. Additional hyperdense lesion at inferior pole LEFT kidney 10 mm diameter unchanged. Stomach/Bowel: Short segment of normal appendix visualized. Diffuse colonic wall thickening  involving the ascending and descending colon consistent with colitis. Distal sigmoid anastomotic staple line. Tiny hiatal hernia. Stomach otherwise unremarkable. Suboptimal assessment of small-bowel wall thickness  due to lack of IV and oral contrast but question diffusely thickened Vascular/Lymphatic: Extensive atherosclerotic calcifications involving coronary arteries, aorta, iliac arteries, visceral arteries. Dilatation of distal abdominal aorta 2.6 cm diameter versus 1.5 cm diameter proximal diameter. Displaced intimal calcifications consistent with chronic dissection. Reproductive: Atrophic uterus.  Unremarkable adnexa Other: Scattered ascites throughout abdomen and pelvis. Peritoneal dialysis catheter present. No free air. No hernia. Musculoskeletal: Degenerative disc disease changes L4-L5. IMPRESSION: Scattered colonic wall thickening involving the ascending and descending colon as well as splenic flexure and questionably small bowel loops; this could reflect inflammatory bowel disease or infection, ischemia considered less likely but not entirely excluded. Little change in bowel wall thickening since previous study. Peritoneal dialysis catheter with scattered ascites. Mild intrahepatic and extrahepatic biliary dilatation, likely related to prior cholecystectomy, recommend correlation with LFTs. Tiny hiatal hernia. 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 This recommendation follows ACR consensus guidelines: White Paper of the ACR Incidental Findings Committee II on Vascular Findings. J Am Coll Radiol 2013; 10:789-794. Aortic Atherosclerosis (ICD10-I70.0). Electronically Signed   By: Lavonia Dana M.D.   On: 12/12/2020 15:05   CT ABDOMEN PELVIS WO CONTRAST  Result Date: 12/05/2020 CLINICAL DATA:   Left lower quadrant pain and diarrhea EXAM: CT ABDOMEN AND PELVIS WITHOUT CONTRAST TECHNIQUE: Multidetector CT imaging of the abdomen and pelvis was performed following the standard protocol without IV contrast. COMPARISON:  10/17/2008 FINDINGS: Lower chest: Lung bases show no acute abnormality. Emphysematous changes are seen. Hepatobiliary: Gallbladder has been surgically removed. Liver is within normal limits. Pancreas: Unremarkable. No pancreatic ductal dilatation or surrounding inflammatory changes. Spleen: Normal in size without focal abnormality. Adrenals/Urinary Tract: Adrenal glands are unremarkable. Kidneys demonstrate bilateral cystic change as well as nonobstructing calculi bilaterally. These lie predominately in the lower pole measuring approximately 4 mm bilaterally. No obstructive changes are seen. The bladder is decompressed. Stomach/Bowel: Postsurgical changes are noted in the rectosigmoid region. No obstructive changes are seen. Mild wall thickening is noted within the distal transverse and proximal descending colon likely representing some focal colitis. The more proximal colon is within normal limits. The appendix is unremarkable. No small bowel abnormality is seen. The stomach is decompressed. Vascular/Lymphatic: Atherosclerotic changes of the abdominal aorta are noted. Mild prominence of the distal descending thoracic aorta is noted at 3 cm with changes of prior chronic dissection. Similar findings are noted in the distal infrarenal aorta measuring up to 2.5 cm. No aneurysmal dilatation is seen. No sizable adenopathy is noted. Reproductive: Uterus and bilateral adnexa are unremarkable. Other: Free fluid is noted within the abdomen and pelvis consistent with the known peritoneal dialysis catheter. Catheter is noted deep in the pelvis. No other focal abnormality is noted. Musculoskeletal: Degenerative changes of lumbar spine are seen. No acute bony abnormality is noted. IMPRESSION: Mild thickening  in the distal transverse and proximal descending colon consistent with focal colitis. No abscess or perforation is noted. Considerable free fluid within the abdomen consistent with peritoneal dialysis. Bilateral nonobstructing renal calculi. Diffuse chronic atherosclerotic changes in the distal descending thoracic aorta and infrarenal abdominal aorta without aneurysmal dilatation. Electronically Signed   By: Inez Catalina M.D.   On: 12/05/2020 09:49   DG Chest Portable 1 View  Result Date: 12/12/2020 CLINICAL DATA:  80 year old female with nausea and vomiting. EXAM: PORTABLE CHEST 1 VIEW COMPARISON:  Chest radiograph dated 12/11/2019 FINDINGS: Background of emphysema. There is minimal eventration of the left hemidiaphragm with probable minimal left lung base atelectasis. Overall interval improvement in aeration of the lungs compared to prior radiograph. No focal consolidation, pleural effusion or pneumothorax. The cardiac silhouette is within limits. Atherosclerotic calcification of the aorta. No acute osseous pathology. IMPRESSION: 1. No acute cardiopulmonary process. 2. Emphysema. Electronically Signed   By: Anner Crete M.D.   On: 12/12/2020 16:45   CT Angio Abd/Pel w/ and/or w/o  Addendum Date: 12/14/2020   ADDENDUM REPORT: 12/14/2020 13:47 ADDENDUM: Addendum created after discussing results with surgical team, Dr. Peyton Najjar. Dilated loops of distal small bowel without evidence of bowel obstruction/transition. In addition, there is redemonstration of mild wall thickening of transverse colon, without distension or abnormal enhancement. Either of these findings may be seen with ileus or nonspecific enteritis/colitis. Electronically Signed   By: Corrie Mckusick D.O.   On: 12/14/2020 13:47   Result Date: 12/14/2020 CLINICAL DATA:  80 year old female with possible mesenteric ischemia EXAM: CTA ABDOMEN AND PELVIS WITHOUT AND WITH CONTRAST TECHNIQUE: Multidetector CT imaging of the abdomen and pelvis was  performed using the standard protocol during bolus administration of intravenous contrast. Multiplanar reconstructed images and MIPs were obtained and reviewed to evaluate the vascular anatomy. CONTRAST:  4m OMNIPAQUE IOHEXOL 350 MG/ML SOLN COMPARISON:  Noncontrast CT comparison 12/12/2020, 12/05/2020 FINDINGS: VASCULAR Aorta: Moderate to advanced atherosclerotic changes of the distal thoracic aorta. Diameter at the hiatus measures 2.4 cm. No dissection flap identified. Infrarenal abdominal aortic aneurysm measuring 2.5 cm at the IMA takeoff. Smallest diameter of the infrarenal abdominal aorta 14 mm more proximally below the renal arteries. Moderate mixed calcified and soft plaque of the abdominal aorta without evidence of high-grade stenosis. Celiac: Atherosclerotic changes at the origin of the celiac artery. 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 There is then reconstitution of the celiac artery secondary to collateral flow with patency of the left gastric artery, splenic artery, common hepatic artery. SMA: Atherosclerotic changes at the origin of the SMA, with mixed calcified and soft plaque. Estimated 50% narrowing at the origin. Arcades are patent. Renals: - Right: Atherosclerotic changes at the origin of the right renal artery without evidence of high-grade stenosis. - Left: Patent stent at the origin of the left renal artery. The metallic artifact limits the study for evaluation of in stent stenosis or edge stenosis. Artery is patent distal to the stent, small caliber. IMA: While the IMA is favored to be patent at the origin though likely stenotic given the degree of atherosclerotic plaque of the aorta and IMA. Left colic artery is patent as well as the proximal superior rectal artery. Right lower extremity: Moderate atherosclerotic changes of the right iliac arterial system with mixed calcified and soft plaque throughout the length of the common iliac  artery and external iliac artery. No high-grade stenosis or occlusion identified. Hypogastric artery appears occluded at the origin secondary to calcified plaque. No aneurysm. Moderate atherosclerotic changes of the right common femoral artery. Proximal profunda femoris and SFA patent. Left lower extremity: Moderate atherosclerotic changes of the left iliac arterial system with mixed calcified and soft plaque through the length of the common iliac artery, external iliac artery. No high-grade stenosis or occlusion. Calcified plaque at the origin of the hypogastric artery somewhat limits the evaluation, however, hypogastric appears patent. Mild to moderate atherosclerotic changes of the common femoral artery. There is a proximal origin  of the lateral circumflex femoral artery. Proximal SFA and profunda femoris are patent. Veins: Unremarkable appearance of the venous system. Review of the MIP images confirms the above findings. NON-VASCULAR Lower chest: No acute finding of the lower chest. Atelectatic changes at the left greater than right lung base/scarring. Emphysema. Hepatobiliary: Unremarkable appearance of liver. Cholecystectomy. Similar appearance of mildly dilated bile ducts in the hilum of the liver, unchanged from the noncontrast CT studies. No radiopaque stones. No inflammatory changes at the head of the pancreas. Pancreas: Unremarkable Spleen: Unremarkable Adrenals/Urinary Tract: - Right adrenal gland: Unremarkable - Left adrenal gland: Unremarkable. - Right kidney: No hydronephrosis. Punctate calcifications in the hilum of the kidney, either nonobstructive stones or vascular calcifications. Unremarkable right ureter. Multiple subcentimeter low-density rounded lesions, potentially cysts. - Left Kidney: No hydronephrosis. Vascular calcifications in the hilum of the kidney. Unremarkable left ureter. Rounded lesion on the lateral cortex of the left kidney and rounded lesion on the inferior cortex of the left  kidney measuring less than 2 cm each. Both of these appear to be complex cysts given their lack of dynamic enhancement. - Urinary Bladder: Urinary bladder is decompressed. Stomach/Bowel: - Stomach: Unremarkable. - Small bowel: Proximal small bowel decompressed. Distal small bowel demonstrates borderline dilation with fluid. No transition point identified. The length of small bowel demonstrates transmural enhancement, with no segment of small bowel demonstrating differential hypoattenuation/lack of enhancement. Terminal ileum is partially fecalized, without distension. - Appendix: Normal appendix. - Colon: The majority of colon demonstrates relative enhancement similar to that of the small bowel, with the appearance of transmural enhancement maintained throughout its length. There are no segments of bowel demonstrating focal wall thickening/edema. Surgical changes of a colonic anastomosis, rectosigmoid junction. Lymphatic: No adenopathy. Mesenteric: Peritoneal dialysis catheter terminates within the low abdomen/pelvis. Small volume of dialysate. Reproductive: Small volume fluid/tissue within the endometrial canal, unusual for patient of this age. Other: Surgical changes along the midline.  No hernia. Musculoskeletal: No acute displaced fracture. No bony canal narrowing. Degenerative changes of the spine. No aggressive sclerotic lesions. Lucent lesion on the right aspect of the L2 vertebral body present in 2009 and unchanged. 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 Reconstitution of the vessel from collateral flow. -estimated 50% narrowing of the SMA origin secondary to calcified and soft plaque. Distal vessels patent. -at least high-grade stenosis of the IMA origin secondary to atherosclerotic plaque. Left colic artery and the proximal superior rectal artery opacify. These findings support the possibility of at least chronic  mesenteric ischemia. The length of small bowel and colon demonstrate transmural enhancement, with no evidence of ischemic bowel. Dilated loops of distal small bowel without evidence of bowel obstruction/transition. This may be seen with ileus or nonspecific enteritis. 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 Stent appears patent with the metallic artifact limiting evaluation for any in-stent stenosis/edge stenosis. **An incidental finding of potential clinical significance has been found. Ill-defined soft tissue/fluid within the endometrial canal, which is unexpected in a patient of this age. Referral for gynecologic follow-up recommended, did determine the need for any biopsy and/or MRI as uterine malignancy not excluded** . Additional ancillary findings as above. Signed, Dulcy Fanny. Dellia Nims, [facility] Electronically Signed: By: Corrie Mckusick D.O. On: 12/14/2020 13:26      Assessment/Plan 1. Colitis- possible C. Diff. Possible SBP from peritoneal Dialysis however, recent cultures negative 2. 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 Celiac occlusion with reconstitution, SMA ~50%, IMA- moderate stenosis. 3. As the patient has had recurrent symptoms despite ABX and supportive care, recommend Mesenteric Angiogram to fully evaluate and intervene if needed. 4. Discussed plan with daughter at bedside. Will Plan for tomorrow per Dr. Lucky Cowboy.   Evaristo Bury, MD  12/14/2020 4:24 PM    This note was created with Dragon medical transcription system.  Any error is purely unintentional

## 2020-12-14 NOTE — Progress Notes (Incomplete)
[place], [place] 12/14/20  Subjective:   Anita Wells is a 80 y.o. African American female with PMHX of anermia, barett's esophagus, carotid stenosis, CKD, GERD, HTN and Hyperlipidemia. She presents to the ED with abdominal pain. She had a recent hospitalization for colitis. She was prescribed antibiotics and discharged. She continues to have nausea, vomiting and diarrhea. No blood reported. CT scan shows colitis.  She says she has had this pain for 2 weeks. It only improves with pain medication. She has been able to continue her PD treatments.   Admitted for colitis with leukocytosis and lactic acidosis  Last PD was 12/12/20  Objective:  Vital signs in last 24 hours:  Temp:  [97 F (36.1 C)-98.1 F (36.7 C)] 97 F (36.1 C) (02/20 0507) Pulse Rate:  [84-105] 105 (02/20 0507) Resp:  [16-20] 20 (02/20 0507) BP: (93-104)/(55-65) 104/65 (02/20 0507) SpO2:  [95 %-100 %] 95 % (02/20 0507)  Weight change:  Filed Weights   12/12/20 1220  Weight: 54.4 kg    Intake/Output:    Intake/Output Summary (Last 24 hours) at 12/14/2020 1033 Last data filed at 12/14/2020 1017 Gross per 24 hour  Intake 2185.24 ml  Output -  Net 2185.24 ml     Physical Exam: General: NAD, ill appearing  HEENT Normocephalic, dry oral mucosa and lips  Pulm/lungs Clear bilaterally  CVS/Heart S1-S2 present  Abdomen:  Soft, tender LLQ  Extremities: Minimal peripheral edema  Neurologic: Alert, oriented  Skin: Dry, no lesions  Access: PD catheter       Basic Metabolic Panel:  Recent Labs  Lab 12/12/20 1350 12/13/20 0427 12/14/20 0458  NA 130* 130* 129*  K 3.4* 3.2* 3.0*  CL 89* 93* 93*  CO2 20* 22 20*  GLUCOSE 136* 216* 212*  BUN 33* 34* 37*  CREATININE 9.12* 7.95* 7.57*  CALCIUM 7.7* 7.1* 7.9*  MG 1.6*  --   --      CBC: Recent Labs  Lab 12/12/20 1241 12/13/20 0427 12/13/20 0428 12/14/20 0458  WBC 29.8* 52.3* 52.3* 38.6*  NEUTROABS  --   --  48.8*  --    HGB 14.4 12.1 12.1 11.1*  HCT 46.0 39.1 39.1 34.1*  MCV 94.8 95.1 95.6 92.2  PLT 293 206 206 183     No results found for: HEPBSAG, HEPBSAB, HEPBIGM    Microbiology:  Recent Results (from the past 240 hour(s))  C Difficile Quick Screen w PCR reflex     Status: Abnormal   Collection Time: 12/05/20  9:28 AM   Specimen: Stool  Result Value Ref Range Status   C Diff antigen POSITIVE (A) NEGATIVE Final   C Diff toxin NEGATIVE NEGATIVE Final   C Diff interpretation Results are indeterminate. See PCR results.  Final    Comment: Performed at [facility], [address] [place], [place] [address]  C. Diff by PCR, Reflexed     Status: None   Collection Time: 12/05/20  9:28 AM  Result Value Ref Range Status   Toxigenic C. Difficile by PCR NEGATIVE NEGATIVE Final    Comment: Patient is colonized with non toxigenic C. difficile. May not need treatment unless significant symptoms are present. Performed at [facility], [place], [place] [address]   SARS CORONAVIRUS 2 (TAT 6-24 HRS) Nasopharyngeal Nasopharyngeal Swab     Status: None   Collection Time: 12/05/20 10:40 AM   Specimen: Nasopharyngeal Swab  Result Value Ref Range Status   SARS Coronavirus  2 NEGATIVE NEGATIVE Final    Comment: (NOTE) SARS-CoV-2 target nucleic acids are NOT DETECTED.  The SARS-CoV-2 RNA is generally detectable in upper and lower respiratory specimens during the acute phase of infection. Negative results do not preclude SARS-CoV-2 infection, do not rule out co-infections with other pathogens, and should not be used as the sole basis for treatment or other patient management decisions. Negative results must be combined with clinical observations, patient history, and epidemiological information. The expected result is Negative.  Fact Sheet for Patients: SugarRoll.be  Fact Sheet for Healthcare  Providers: https://www.woods-mathews.com/  This test is not yet approved or cleared by the [place] FDA and  has been authorized for detection and/or diagnosis of SARS-CoV-2 by FDA under an Emergency Use Authorization (EUA). This EUA will remain  in effect (meaning this test can be used) for the duration of the COVID-19 declaration under Se ction 564(b)(1) of the Act, 21 U.S.C. section 360bbb-3(b)(1), unless the authorization is terminated or revoked sooner.  Performed at [facility], [place] [address] [place], [place] [address]   Culture, blood (Routine X 2) w Reflex to ID Panel     Status: None   Collection Time: 12/05/20 11:30 AM   Specimen: BLOOD  Result Value Ref Range Status   Specimen Description BLOOD RIGHT ANTECUBITAL  Final   Special Requests   Final    BOTTLES DRAWN AEROBIC AND ANAEROBIC Blood Culture results may not be optimal due to an inadequate volume of blood received in culture bottles   Culture   Final    NO GROWTH 5 DAYS Performed at [facility], [address] [place], [place] [address]    Report Status 12/10/2020 FINAL  Final  Culture, blood (Routine X 2) w Reflex to ID Panel     Status: None   Collection Time: 12/05/20 11:30 AM   Specimen: BLOOD  Result Value Ref Range Status   Specimen Description BLOOD BLOOD RIGHT WRIST  Final   Special Requests   Final    BOTTLES DRAWN AEROBIC AND ANAEROBIC Blood Culture results may not be optimal due to an inadequate volume of blood received in culture bottles   Culture   Final    NO GROWTH 5 DAYS Performed at [facility], [address] [place], [place] [address]    Report Status 12/10/2020 FINAL  Final  Gastrointestinal Panel by PCR , Stool     Status: None   Collection Time: 12/05/20  4:40 PM   Specimen: Stool  Result Value Ref Range Status   Campylobacter species NOT DETECTED NOT DETECTED Final   Plesimonas shigelloides NOT DETECTED NOT DETECTED Final   Salmonella  species NOT DETECTED NOT DETECTED Final   Yersinia enterocolitica NOT DETECTED NOT DETECTED Final   Vibrio species NOT DETECTED NOT DETECTED Final   Vibrio cholerae NOT DETECTED NOT DETECTED Final   Enteroaggregative E coli (EAEC) NOT DETECTED NOT DETECTED Final   Enteropathogenic E coli (EPEC) NOT DETECTED NOT DETECTED Final   Enterotoxigenic E coli (ETEC) NOT DETECTED NOT DETECTED Final   Shiga like toxin producing E coli (STEC) NOT DETECTED NOT DETECTED Final   Shigella/Enteroinvasive E coli (EIEC) NOT DETECTED NOT DETECTED Final   Cryptosporidium NOT DETECTED NOT DETECTED Final   Cyclospora cayetanensis NOT DETECTED NOT DETECTED Final   Entamoeba histolytica NOT DETECTED NOT DETECTED Final   Giardia lamblia NOT DETECTED NOT DETECTED Final   Adenovirus F40/41 NOT DETECTED NOT DETECTED Final   Astrovirus NOT DETECTED NOT  DETECTED Final   Norovirus GI/GII NOT DETECTED NOT DETECTED Final   Rotavirus A NOT DETECTED NOT DETECTED Final   Sapovirus (I, II, IV, and V) NOT DETECTED NOT DETECTED Final    Comment: Performed at [facility], [address] [place], [place] [address]  Culture, blood (routine x 2)     Status: None (Preliminary result)   Collection Time: 12/12/20  1:50 PM   Specimen: BLOOD  Result Value Ref Range Status   Specimen Description BLOOD BLOOD RIGHT ARM  Final   Special Requests   Final    BOTTLES DRAWN AEROBIC AND ANAEROBIC Blood Culture adequate volume   Culture   Final    NO GROWTH 2 DAYS Performed at [facility], [place], [place] [address]    Report Status PENDING  Incomplete  SARS CORONAVIRUS 2 (TAT 6-24 HRS) Nasopharyngeal Nasopharyngeal Swab     Status: None   Collection Time: 12/12/20  2:30 PM   Specimen: Nasopharyngeal Swab  Result Value Ref Range Status   SARS Coronavirus 2 NEGATIVE NEGATIVE Final    Comment: (NOTE) SARS-CoV-2 target nucleic acids are NOT DETECTED.  The SARS-CoV-2 RNA is generally detectable in  upper and lower respiratory specimens during the acute phase of infection. Negative results do not preclude SARS-CoV-2 infection, do not rule out co-infections with other pathogens, and should not be used as the sole basis for treatment or other patient management decisions. Negative results must be combined with clinical observations, patient history, and epidemiological information. The expected result is Negative.  Fact Sheet for Patients: SugarRoll.be  Fact Sheet for Healthcare Providers: https://www.woods-mathews.com/  This test is not yet approved or cleared by the [place] FDA and  has been authorized for detection and/or diagnosis of SARS-CoV-2 by FDA under an Emergency Use Authorization (EUA). This EUA will remain  in effect (meaning this test can be used) for the duration of the COVID-19 declaration under Se ction 564(b)(1) of the Act, 21 U.S.C. section 360bbb-3(b)(1), unless the authorization is terminated or revoked sooner.  Performed at [facility], [place] [address] [place], [place] [address]   Culture, blood (routine x 2)     Status: None (Preliminary result)   Collection Time: 12/12/20  2:56 PM   Specimen: BLOOD  Result Value Ref Range Status   Specimen Description BLOOD BLOOD RIGHT FOREARM  Final   Special Requests   Final    BOTTLES DRAWN AEROBIC AND ANAEROBIC Blood Culture adequate volume   Culture   Final    NO GROWTH 2 DAYS Performed at [facility], [address] [place],  [address]    Report Status PENDING  Incomplete  Gastrointestinal Panel by PCR , Stool     Status: Abnormal   Collection Time: 12/13/20  9:40 AM   Specimen: Stool  Result Value Ref Range Status   Campylobacter species NOT DETECTED NOT DETECTED Final   Plesimonas shigelloides NOT DETECTED NOT DETECTED Final   Salmonella species NOT DETECTED NOT DETECTED Final   Yersinia enterocolitica NOT DETECTED NOT DETECTED Final    Vibrio species NOT DETECTED NOT DETECTED Final   Vibrio cholerae NOT DETECTED NOT DETECTED Final   Enteroaggregative E coli (EAEC) NOT DETECTED NOT DETECTED Final   Enteropathogenic E coli (EPEC) NOT DETECTED NOT DETECTED Final   Enterotoxigenic E coli (ETEC) NOT DETECTED NOT DETECTED Final   Shiga like toxin producing E coli (STEC) NOT DETECTED NOT DETECTED Final   Shigella/Enteroinvasive E coli (EIEC)  NOT DETECTED NOT DETECTED Final   Cryptosporidium NOT DETECTED NOT DETECTED Final   Cyclospora cayetanensis NOT DETECTED NOT DETECTED Final   Entamoeba histolytica NOT DETECTED NOT DETECTED Final   Giardia lamblia NOT DETECTED NOT DETECTED Final   Adenovirus F40/41 DETECTED (A) NOT DETECTED Final   Astrovirus NOT DETECTED NOT DETECTED Final   Norovirus GI/GII NOT DETECTED NOT DETECTED Final   Rotavirus A NOT DETECTED NOT DETECTED Final   Sapovirus (I, II, IV, and V) NOT DETECTED NOT DETECTED Final    Comment: Performed at [facility], [address] [place], [place] [address]  C Difficile Quick Screen (NO PCR Reflex)     Status: Abnormal   Collection Time: 12/13/20  9:40 AM   Specimen: STOOL  Result Value Ref Range Status   C Diff antigen POSITIVE (A) NEGATIVE Final   C Diff toxin NEGATIVE NEGATIVE Final   C Diff interpretation   Final    Results are indeterminate. Please contact the provider listed for your campus for C diff questions in [place].    Comment: Performed at [facility], [address] [place], [place] [address]  C. Diff by PCR, Reflexed     Status: None   Collection Time: 12/13/20  9:40 AM  Result Value Ref Range Status   Toxigenic C. Difficile by PCR NEGATIVE NEGATIVE Final    Comment: Patient is colonized with non toxigenic C. difficile. May not need treatment unless significant symptoms are present. Performed at [facility], [address] [place], [place] [address]     Coagulation Studies: No results for input(s): LABPROT, INR  in the last 72 hours.  Urinalysis: No results for input(s): COLORURINE, LABSPEC, PHURINE, GLUCOSEU, HGBUR, BILIRUBINUR, KETONESUR, PROTEINUR, UROBILINOGEN, NITRITE, LEUKOCYTESUR in the last 72 hours.  Invalid input(s): APPERANCEUR    Imaging: CT Abdomen Pelvis Wo Contrast  Result Date: 12/12/2020 CLINICAL DATA:  Colitis, sepsis, worsening symptoms, weaker; history hypertension, end-stage renal disease on dialysis, type II diabetes mellitus EXAM: CT ABDOMEN AND PELVIS WITHOUT CONTRAST TECHNIQUE: Multidetector CT imaging of the abdomen and pelvis was performed following the standard protocol without IV contrast. Sagittal and coronal MPR images reconstructed from axial data set. No oral contrast administered. COMPARISON:  12/05/2020 FINDINGS: Lower chest: Lung bases clear Hepatobiliary: Gallbladder surgically absent. No focal hepatic masses. Mild intrahepatic and extrahepatic biliary dilatation. Pancreas: Normal appearance Spleen: Normal appearance Adrenals/Urinary Tract: Bladder decompressed. Adrenal gland thickening without discrete mass. Small BILATERAL renal cysts, suboptimally characterized. Additional hyperdense lesion at inferior pole LEFT kidney 10 mm diameter unchanged. Stomach/Bowel: Short segment of normal appendix visualized. Diffuse colonic wall thickening involving the ascending and descending colon consistent with colitis. Distal sigmoid anastomotic staple line. Tiny hiatal hernia. Stomach otherwise unremarkable. Suboptimal assessment of small-bowel wall thickness due to lack of IV and oral contrast but question diffusely thickened Vascular/Lymphatic: Extensive atherosclerotic calcifications involving coronary arteries, aorta, iliac arteries, visceral arteries. Dilatation of distal abdominal aorta 2.6 cm diameter versus 1.5 cm diameter proximal diameter. Displaced intimal calcifications consistent with chronic dissection. Reproductive: Atrophic uterus.  Unremarkable adnexa Other: Scattered  ascites throughout abdomen and pelvis. Peritoneal dialysis catheter present. No free air. No hernia. Musculoskeletal: Degenerative disc disease changes L4-L5. IMPRESSION: Scattered colonic wall thickening involving the ascending and descending colon as well as splenic flexure and questionably small bowel loops; this could reflect inflammatory bowel disease or infection, ischemia considered less likely but not entirely excluded. Little change in bowel wall thickening since previous study. Peritoneal dialysis catheter with scattered  ascites. Mild intrahepatic and extrahepatic biliary dilatation, likely related to prior cholecystectomy, recommend correlation with LFTs. Tiny hiatal hernia. 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 This recommendation follows ACR consensus guidelines: White Paper of the ACR Incidental Findings Committee II on Vascular Findings. J Am Coll Radiol 2013; 10:789-794. Aortic Atherosclerosis (ICD10-I70.0). Electronically Signed   By: Lavonia Dana M.D.   On: 12/12/2020 15:05   DG Chest Portable 1 View  Result Date: 12/12/2020 CLINICAL DATA:  80 year old female with nausea and vomiting. EXAM: PORTABLE CHEST 1 VIEW COMPARISON:  Chest radiograph dated 12/11/2019 FINDINGS: Background of emphysema. There is minimal eventration of the left hemidiaphragm with probable minimal left lung base atelectasis. Overall interval improvement in aeration of the lungs compared to prior radiograph. No focal consolidation, pleural effusion or pneumothorax. The cardiac silhouette is within limits. Atherosclerotic calcification of the aorta. No acute osseous pathology. IMPRESSION: 1. No acute cardiopulmonary process. 2. Emphysema. Electronically Signed   By: Anner Crete M.D.   On: 12/12/2020 16:45      Medications:   . sodium chloride Stopped (12/13/20 1220)  . cefTRIAXone (ROCEPHIN)  IV Stopped (12/13/20 2342)  . dialysis solution 1.5% low-MG/low-CA    . lactated ringers 75 mL/hr at 12/14/20 0304  . metronidazole 500 mg (12/14/20 0657)   . aspirin  81 mg Oral Daily  . atorvastatin  40 mg Oral QHS  . calcium acetate  1,334 mg Oral TID WC  . Chlorhexidine Gluconate Cloth  6 each Topical Daily  . escitalopram  10 mg Oral Daily  . fluticasone furoate-vilanterol  1 puff Inhalation Daily   And  . umeclidinium bromide  1 puff Inhalation Daily  . gentamicin cream  1 application Topical Daily  . heparin  5,000 Units Subcutaneous Q8H  . insulin aspart  0-5 Units Subcutaneous QHS  . insulin aspart  0-6 Units Subcutaneous TID WC  . mirtazapine  7.5 mg Oral QHS  . multivitamin  1 tablet Oral Daily  . pantoprazole  40 mg Oral Daily  . potassium chloride  20 mEq Oral Daily   sodium chloride, acetaminophen **OR** acetaminophen, albuterol, heparin, ondansetron **OR** ondansetron (ZOFRAN) IV, oxyCODONE-acetaminophen  Assessment/ Plan:  80 y.o. female with PMHX of anermia, barett's esophagus, carotid stenosis, CKD, GERD, HTN and Hyperlipidemia. was admitted on 12/12/2020 for Colitis [K52.9]  Active Problems:   Type 2 diabetes mellitus with ESRD (end-stage renal disease) (HCC)   Colitis   Lactic acidosis   Acute pancreatitis without infection or necrosis   Hypotension  Colitis [K52.9] Generalized abdominal pain [R10.84]  #. End Stage Renal Disease on Peritoneal Dialysis -CCPD 10 hours, 4 exchanges, 2000 fills -Continue home regimen PD nightly -Next treatment tonight -Start lactated ringers '@75'$  ml/hr for hydration -Will monitor fluid and labs to prevent fluid overload  #. Anemia of Chronic kidney disease  Lab Results  Component Value Date   HGB 11.1 (L) 12/14/2020   -will monitor for changes  #. Secondary hyperparathyroidism of renal origin     Component Value Date/Time    PTH 181 (H) 12/12/2019 1505   Lab Results  Component Value Date   PHOS 3.1 12/12/2019  Calcium acetate ordered with meals Monitor calcium and phos level during this admission   #. Diabetes type 2 with CKD Hgb A1c MFr Bld (%)  Date Value  12/12/2019  13.0 (H)  Glucose levels controlled at this time  #. Acute Colitis with leukocytosis, abdominal pain and lactic acidosis -pain likely due to recent acute colitis -workup in progress for infectious vs ischemic -C-diff toxin negative -appreciate IM management    LOS: 2 Colon [place] 2/20/202210:33 AM  [facility] [place], [place]

## 2020-12-14 NOTE — Progress Notes (Signed)
SURGICAL CONSULTATION NOTE   HISTORY OF PRESENT ILLNESS (HPI):  80 y.o. female presented to [facility] ED for evaluation of abdominal pain and diarrhea.  She had a recent admission due to colitis.  She was discharged and sent home with antibiotic therapy.   History was taken from hospitalist physician due to patient decreasing mental status.  Patient was unable to give history.  As per hospitalist the patient today was seen more lethargic than usual and with increased tenderness on her abdominal physical exam.  Initially she came with 52,000 white blood cell count and now has decreased to 38.  With the changes in mental status and increased tenderness on abdominal exam CT scan of the abdomen was done.  Surgery is consulted by Dr. Leslye Peer in this context for evaluation and management of acute colitis.  PAST MEDICAL HISTORY (PMH):  Past Medical History:  Diagnosis Date  . Anemia   . Barrett's esophagus   . Carotid stenosis, bilateral   . Chronic kidney disease   . GERD (gastroesophageal reflux disease)   . Hyperlipidemia   . Hyperparathyroidism ([place])   . Hypertension   . Renal artery stenosis ([facility])    left     PAST SURGICAL HISTORY ([place]):  Past Surgical History:  Procedure Laterality Date  . A/V FISTULAGRAM Left 02/19/2019   Procedure: A/V FISTULAGRAM;  Surgeon: Algernon Huxley, MD;  Location: [place] [facility];  Service: Cardiovascular;  Laterality: Left;  . AV FISTULA PLACEMENT  12/21/2018   Procedure: ARTERIOVENOUS (AV) FISTULA CREATION;  Surgeon: Algernon Huxley, MD;  Location: [facility];  Service: Vascular;;  . COLON RESECTION    . COLON SURGERY    . colonoscpy    . DIALYSIS/PERMA CATHETER INSERTION N/A 11/15/2018   Procedure: DIALYSIS/PERMA CATHETER INSERTION;  Surgeon: Algernon Huxley, MD;  Location: [place] [facility];  Service: Cardiovascular;  Laterality: N/A;  . DIALYSIS/PERMA CATHETER REMOVAL N/A 09/27/2019   Procedure: DIALYSIS/PERMA CATHETER REMOVAL;  Surgeon: Algernon Huxley, MD;   Location: [place] [facility];  Service: Cardiovascular;  Laterality: N/A;  . RENAL ARTERY STENT Left      MEDICATIONS:  Prior to Admission medications   Medication Sig Start Date End Date Taking? Authorizing Provider  amLODipine (NORVASC) 2.5 MG tablet Take 2.5 mg by mouth daily.   Yes [provider]  amoxicillin-clavulanate (AUGMENTIN) 500-125 MG tablet Take 1 tablet (500 mg total) by mouth daily for 5 days. 12/09/20 12/14/20 Yes Fritzi Mandes, MD  aspirin 81 MG chewable tablet Chew 81 mg by mouth daily.   Yes [provider]  atorvastatin (LIPITOR) 40 MG tablet Take 40 mg by mouth at bedtime.    Yes [provider]  calcium acetate (PHOSLO) 667 MG capsule Take by mouth See admin instructions. Take 2 capsules by mouth with each meal and then 1 capsule with 2 snacks a day   Yes [provider]  diphenhydrAMINE (BENADRYL) 25 MG tablet Take 25 mg by mouth at bedtime as needed.   Yes [provider]  escitalopram (LEXAPRO) 10 MG tablet Take 10 mg by mouth daily.   Yes [provider]  insulin glargine (LANTUS) 100 UNIT/ML injection Inject 0.05 mLs (5 Units total) into the skin daily. Pt advised to take insulin according to her sugar check 12/09/20  Yes Fritzi Mandes, MD  mirtazapine (REMERON) 7.5 MG tablet Take 7.5 mg by mouth at bedtime.   Yes [provider]  multivitamin (RENA-VIT) TABS tablet Take 1 tablet by mouth  daily.    Yes [provider]  omeprazole (PRILOSEC) 40 MG capsule Take 40 mg by mouth daily.   Yes [provider]  oxyCODONE-acetaminophen (PERCOCET/ROXICET) 5-325 MG tablet Take 1 tablet by mouth every 8 (eight) hours as needed for severe pain. 12/08/20  Yes Fritzi Mandes, MD  Potassium Chloride ER 20 MEQ TBCR Take 1 tablet by mouth daily. 04/20/20  Yes [provider]  albuterol (VENTOLIN HFA) 108 (90 Base) MCG/ACT inhaler Inhale into the lungs. 04/18/20 04/18/21  [provider]   Fluticasone-Salmeterol (ADVAIR) 250-50 MCG/DOSE AEPB Inhale into the lungs. 04/18/20 04/18/21  [provider]  Fluticasone-Umeclidin-Vilant (TRELEGY ELLIPTA) 100-62.5-25 MCG/INH AEPB Inhale into the lungs. 09/09/20   [provider]     ALLERGIES:  Allergies  Allergen Reactions  . Clopidogrel Swelling    Mouth swelling  . Ace Inhibitors Cough  . Amlodipine Other (See Comments)    Peripheral edema (tolerates low dose)     SOCIAL HISTORY:  Social History   Socioeconomic History  . Marital status: Married    Spouse name: Not on file  . Number of children: Not on file  . Years of education: Not on file  . Highest education level: Not on file  Occupational History  . Not on file  Tobacco Use  . Smoking status: Current Every Day Smoker    Packs/day: 0.25    Years: 50.00    Pack years: 12.50    Types: Cigarettes  . Smokeless tobacco: Never Used  . Tobacco comment: She states she is ready to quit 09/15/2020.  Vaping Use  . Vaping Use: Never used  Substance and Sexual Activity  . Alcohol use: Not Currently  . Drug use: Never  . Sexual activity: Not on file  Other Topics Concern  . Not on file  Social History Narrative  . Not on file   Social Determinants of Health   Financial Resource Strain: Not on file  Food Insecurity: Not on file  Transportation Needs: Not on file  Physical Activity: Not on file  Stress: Not on file  Social Connections: Not on file  Intimate Partner Violence: Not on file      FAMILY HISTORY:  Family History  Problem Relation Age of Onset  . Diabetes Mellitus II Mother   . Hypertension Mother   . Alcohol abuse Father      REVIEW OF SYSTEMS:  Constitutional: denies weight loss, fever, chills, or sweats  Eyes: denies any other vision changes, history of eye injury  ENT: denies sore throat, hearing problems  Respiratory: denies shortness of breath, wheezing  Cardiovascular: denies chest pain, palpitations   Gastrointestinal: Positive abdominal pain, nausea and vomiting, diarrhea Genitourinary: denies burning with urination or urinary frequency Musculoskeletal: denies any other joint pains or cramps  Skin: denies any other rashes or skin discolorations  Neurological: denies any other headache, dizziness, weakness.  Positive for lethargy Psychiatric: denies any other depression, anxiety   All other review of systems were negative   VITAL SIGNS:  Temp:  [97 F (36.1 C)-98.1 F (36.7 C)] 97 F (36.1 C) (02/20 0507) Pulse Rate:  [84-105] 105 (02/20 0507) Resp:  [16-20] 20 (02/20 0507) BP: (93-104)/(55-65) 104/65 (02/20 0507) SpO2:  [95 %-100 %] 95 % (02/20 0507)     Height: '5\' 6"'$  (167.6 cm) Weight: 54.4 kg BMI (Calculated): 19.38   INTAKE/OUTPUT:  This shift: Total I/O In: 420 [P.O.:420] Out: -   Last 2 shifts: '@IOLAST2SHIFTS'$ @   PHYSICAL EXAM:  Constitutional:  --  Normal body habitus  -- Awake but mildly lethargic Eyes:  -- Pupils equally round and reactive to light  -- No scleral icterus  Ear, nose, and throat:  -- No jugular venous distension  Pulmonary:  -- No crackles  -- Equal breath sounds bilaterally -- Breathing non-labored at rest Cardiovascular:  -- S1, S2 present  -- No pericardial rubs Gastrointestinal:  -- Abdomen soft, nontender, non-distended, no guarding or rebound tenderness -- No abdominal masses appreciated, pulsatile or otherwise  Musculoskeletal and Integumentary:  -- Wounds: None appreciated -- Extremities: B/L UE and LE FROM, hands and feet warm, no edema  Neurologic:  -- Motor function: intact and symmetric -- Sensation: intact and symmetric   Labs:  CBC Latest Ref Rng & Units 12/14/2020 12/13/2020 12/13/2020  WBC 4.0 - 10.5 K/uL 38.6(H) 52.3(HH) 52.3(HH)  Hemoglobin 12.0 - 15.0 g/dL 11.1(L) 12.1 12.1  Hematocrit 36.0 - 46.0 % 34.1(L) 39.1 39.1  Platelets 150 - 400 K/uL 183 206 206   CMP Latest Ref Rng & Units 12/14/2020 12/13/2020 12/12/2020   Glucose 70 - 99 mg/dL 212(H) 216(H) 136(H)  BUN 8 - 23 mg/dL 37(H) 34(H) 33(H)  Creatinine 0.44 - 1.00 mg/dL 7.57(H) 7.95(H) 9.12(H)  Sodium 135 - 145 mmol/L 129(L) 130(L) 130(L)  Potassium 3.5 - 5.1 mmol/L 3.0(L) 3.2(L) 3.4(L)  Chloride 98 - 111 mmol/L 93(L) 93(L) 89(L)  CO2 22 - 32 mmol/L 20(L) 22 20(L)  Calcium 8.9 - 10.3 mg/dL 7.9(L) 7.1(L) 7.7(L)  Total Protein 6.5 - 8.1 g/dL - 4.9(L) 5.3(L)  Total Bilirubin 0.3 - 1.2 mg/dL - 0.5 0.4  Alkaline Phos 38 - 126 U/L - 107 128(H)  AST 15 - 41 U/L - 24 29  ALT 0 - 44 U/L - 17 20     Imaging studies:  I personally evaluated the CT scan done on admission.  I was not too concerned about the reported colon wall thickening as this can be seen in patient with chronic intra-abdominal fluid/aspiration with peritoneal dialysis.  No free air.   EXAM: CT ABDOMEN AND PELVIS WITHOUT CONTRAST  TECHNIQUE: Multidetector CT imaging of the abdomen and pelvis was performed following the standard protocol without IV contrast. Sagittal and coronal MPR images reconstructed from axial data set. No oral contrast administered.  COMPARISON:  12/05/2020  FINDINGS: Lower chest: Lung bases clear  Hepatobiliary: Gallbladder surgically absent. No focal hepatic masses. Mild intrahepatic and extrahepatic biliary dilatation.  Pancreas: Normal appearance  Spleen: Normal appearance  Adrenals/Urinary Tract: Bladder decompressed. Adrenal gland thickening without discrete mass. Small BILATERAL renal cysts, suboptimally characterized. Additional hyperdense lesion at inferior pole LEFT kidney 10 mm diameter unchanged.  Stomach/Bowel: Short segment of normal appendix visualized. Diffuse colonic wall thickening involving the ascending and descending colon consistent with colitis. Distal sigmoid anastomotic staple line. Tiny hiatal hernia. Stomach otherwise unremarkable. Suboptimal assessment of small-bowel wall thickness due to lack of IV and  oral contrast but question diffusely thickened  Vascular/Lymphatic: Extensive atherosclerotic calcifications involving coronary arteries, aorta, iliac arteries, visceral arteries. Dilatation of distal abdominal aorta 2.6 cm diameter versus 1.5 cm diameter proximal diameter. Displaced intimal calcifications consistent with chronic dissection.  Reproductive: Atrophic uterus.  Unremarkable adnexa  Other: Scattered ascites throughout abdomen and pelvis. Peritoneal dialysis catheter present. No free air. No hernia.  Musculoskeletal: Degenerative disc disease changes L4-L5.  IMPRESSION: Scattered colonic wall thickening involving the ascending and descending colon as well as splenic flexure and questionably small bowel loops; this could reflect inflammatory bowel disease or infection, ischemia considered less  likely but not entirely excluded.  Little change in bowel wall thickening since previous study.  Peritoneal dialysis catheter with scattered ascites.  Mild intrahepatic and extrahepatic biliary dilatation, likely related to prior cholecystectomy, recommend correlation with LFTs.  Tiny hiatal hernia.  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 This recommendation follows ACR consensus guidelines: White Paper of the ACR Incidental Findings Committee II on Vascular Findings. J Am Coll Radiol 2013; 10:789-794.  Aortic Atherosclerosis (ICD10-I70.0).   Electronically Signed   By: Lavonia Dana M.D.   On: 12/12/2020 15:05  Assessment/Plan:  80 y.o. female admitted with colitis with severe leukocytosis, complicated by pertinent comorbidities including end-stage renal disease on peritoneal dialysis, chronic anemia, hyperparathyroidism, hypertension, renal  artery stenosis.  Patient with worsening abdominal pain despite antibiotic therapy.  There has been improvement in white blood cell count but clinically the patient seems to be deteriorating from the history taken from the hospitalist.  I agree with to proceed with his repeating CT scan to see if there is any worsening colitis such as any sign of ischemic colitis.  Other differential that needs to be considered are secondary bacterial peritonitis which patient is already on antibiotic therapy.  Also even though the C. difficile study was negative I think that she will stay in the differential due to the severely elevated white blood cell count.  Might consider to treat empirically with oral p.o. vancomycin.  I will follow closely with the CT scan today for further recommendations.  Keep patient n.p.o. for now.   Arnold Long, MD

## 2020-12-14 NOTE — Progress Notes (Signed)
[facility] Hospital Day(s): 2.   Post op day(s):  Marland Kitchen   Interval History: Patient seen and examined. No changes on history since last evaluation earlier this morning. Continue arousable.   Vital signs in last 24 hours: [min-max] current  Temp:  [97 F (36.1 C)-98.1 F (36.7 C)] 98 F (36.7 C) (02/20 1114) Pulse Rate:  [84-105] 105 (02/20 1114) Resp:  [16-20] 16 (02/20 1114) BP: (93-108)/(55-66) 108/66 (02/20 1114) SpO2:  [95 %-100 %] 98 % (02/20 1114)     Height: '5\' 6"'$  (167.6 cm) Weight: 54.4 kg BMI (Calculated): 19.38   Physical Exam:  Constitutional: lethargic  Respiratory: breathing non-labored at rest  Cardiovascular: regular rate and sinus rhythm  Gastrointestinal: soft, tender, and distended  Labs:  CBC Latest Ref Rng & Units 12/14/2020 12/13/2020 12/13/2020  WBC 4.0 - 10.5 K/uL 38.6(H) 52.3(HH) 52.3(HH)  Hemoglobin 12.0 - 15.0 g/dL 11.1(L) 12.1 12.1  Hematocrit 36.0 - 46.0 % 34.1(L) 39.1 39.1  Platelets 150 - 400 K/uL 183 206 206   CMP Latest Ref Rng & Units 12/14/2020 12/13/2020 12/12/2020  Glucose 70 - 99 mg/dL 212(H) 216(H) 136(H)  BUN 8 - 23 mg/dL 37(H) 34(H) 33(H)  Creatinine 0.44 - 1.00 mg/dL 7.57(H) 7.95(H) 9.12(H)  Sodium 135 - 145 mmol/L 129(L) 130(L) 130(L)  Potassium 3.5 - 5.1 mmol/L 3.0(L) 3.2(L) 3.4(L)  Chloride 98 - 111 mmol/L 93(L) 93(L) 89(L)  CO2 22 - 32 mmol/L 20(L) 22 20(L)  Calcium 8.9 - 10.3 mg/dL 7.9(L) 7.1(L) 7.7(L)  Total Protein 6.5 - 8.1 g/dL - 4.9(L) 5.3(L)  Total Bilirubin 0.3 - 1.2 mg/dL - 0.5 0.4  Alkaline Phos 38 - 126 U/L - 107 128(H)  AST 15 - 41 U/L - 24 29  ALT 0 - 44 U/L - 17 20    Imaging studies: I personally reviewed the CT scan of the abdomen and pelvis and discussed at length with radiologist. The colon and small bowel wall and mucosa and good enhancement on arterial phase. This suggest against ischemia. Basically the whole large intestine and distal portion of the small bowel are thickened and dilated. There  are chronic vascular changes but no sign of acute ischemic changes.    Assessment/Plan:  Patient re evaluated and there has not been any improvement or deterioration of clinical status. CT angio of the abdomen and pelvis is not suggestive of acute ischemic changes. The bowel has adequate enhancement. The patter of colitis that involve the whole colon also is not usual of an acute ischemic event. Ischemia is usual more patchy or localized to a certain portion. The pattern of colitis with small bowel dilation without transition point favor of colitis with reactive ileus. With this patient history of antibiotic therapy, diarrhea and WBC count >50, I have high suspicious of C diff colitis even with the negative result. I will recommend to consider Vancomycin rectally since orally might not work due to ileus. Other considerations are secondary bacterial peritonitis with patient history of peritoneal dialysis catheter. I will continue to follow closely.   Arnold Long, MD

## 2020-12-14 NOTE — Progress Notes (Signed)
Pharmacy Antibiotic Note  Anita Wells is a 80 y.o. female with a PMH of anemia, barett's esophagus, carotid stenosis, CKD, GERD, HTN and Hyperlipidemia and known history of recent hospitalization with colitis and was given Zosyn for 3 days then transitioned to PO augmentin. Patient presented to ED on 12/12/2020 for abdominal pain. Pt has ESRD on peritoneal dialysis - nephrology following. Pharmacy has been consulted for Zosyn dosing for intra-abdominal infection.  Plan: Start Zosyn 2.25 g IV q8 hours Monitor clinical improvement and follow up when appropriate to switch to oral antibiotics if needed  Height: '5\' 6"'$  (167.6 cm) Weight: 54.4 kg (120 lb) IBW/kg (Calculated) : 59.3  Temp (24hrs), Avg:97.7 F (36.5 C), Min:97 F (36.1 C), Max:98.1 F (36.7 C)  Recent Labs  Lab 12/12/20 1241 12/12/20 1343 12/12/20 1350 12/12/20 1644 12/13/20 0427 12/13/20 0428 12/14/20 0458  WBC 29.8*  --   --   --  52.3* 52.3* 38.6*  CREATININE  --   --  9.12*  --  7.95*  --  7.57*  LATICACIDVEN  --  5.6*  --  3.0*  --   --  3.7*    Estimated Creatinine Clearance: 5 mL/min (A) (by C-G formula based on SCr of 7.57 mg/dL (H)).    Allergies  Allergen Reactions  . Clopidogrel Swelling    Mouth swelling  . Ace Inhibitors Cough  . Amlodipine Other (See Comments)    Peripheral edema (tolerates low dose)    Antimicrobials this admission: 2/18 Cefepime and vancomycin x1 2/18 Ceftriaxone >> 2/19 2/18 Metronidazole >> 2/20  Microbiology results: 2/18 BCx: NGTD 2/19 C diff PCR: negative  Thank you for allowing pharmacy to be a part of this patient's care.  Sherilyn Banker, PharmD Pharmacy Resident  12/14/2020 12:29 PM

## 2020-12-14 NOTE — Progress Notes (Signed)
Mobility Specialist - Progress Note   12/14/20 1400  Mobility  Activity Contraindicated/medical hold  Mobility performed by Mobility specialist    Per discussion with nurse, pt very lethargic this date with increased pain contraindicating mobility session. Will attempt another date/time as medically appropriate.    Kathee Delton Mobility Specialist 12/14/20, 2:57 PM

## 2020-12-15 ENCOUNTER — Ambulatory Visit: Payer: Medicare Other

## 2020-12-15 ENCOUNTER — Other Ambulatory Visit (INDEPENDENT_AMBULATORY_CARE_PROVIDER_SITE_OTHER): Payer: Self-pay | Admitting: Vascular Surgery

## 2020-12-15 ENCOUNTER — Encounter
Admission: EM | Disposition: A | Discharge status: Hospice | Payer: Self-pay | Source: Home / Self Care | Attending: Internal Medicine

## 2020-12-15 DIAGNOSIS — E871 Hypo-osmolality and hyponatremia: Secondary | ICD-10-CM

## 2020-12-15 DIAGNOSIS — Z992 Dependence on renal dialysis: Secondary | ICD-10-CM

## 2020-12-15 DIAGNOSIS — G9341 Metabolic encephalopathy: Secondary | ICD-10-CM | POA: Diagnosis not present

## 2020-12-15 DIAGNOSIS — K551 Chronic vascular disorders of intestine: Secondary | ICD-10-CM

## 2020-12-15 DIAGNOSIS — D72823 Leukemoid reaction: Secondary | ICD-10-CM | POA: Diagnosis not present

## 2020-12-15 DIAGNOSIS — E876 Hypokalemia: Secondary | ICD-10-CM

## 2020-12-15 DIAGNOSIS — I959 Hypotension, unspecified: Secondary | ICD-10-CM | POA: Diagnosis not present

## 2020-12-15 DIAGNOSIS — I120 Hypertensive chronic kidney disease with stage 5 chronic kidney disease or end stage renal disease: Secondary | ICD-10-CM

## 2020-12-15 DIAGNOSIS — K529 Noninfective gastroenteritis and colitis, unspecified: Secondary | ICD-10-CM | POA: Diagnosis not present

## 2020-12-15 DIAGNOSIS — I771 Stricture of artery: Secondary | ICD-10-CM | POA: Diagnosis not present

## 2020-12-15 HISTORY — PX: VISCERAL ANGIOGRAPHY: CATH118276

## 2020-12-15 LAB — GLUCOSE, CAPILLARY
Glucose-Capillary: 167 mg/dL — ABNORMAL HIGH (ref 70–99)
Glucose-Capillary: 150 mg/dL — ABNORMAL HIGH (ref 70–99)
Glucose-Capillary: 107 mg/dL — ABNORMAL HIGH (ref 70–99)
Glucose-Capillary: 105 mg/dL — ABNORMAL HIGH (ref 70–99)
Glucose-Capillary: 95 mg/dL (ref 70–99)
Glucose-Capillary: 80 mg/dL (ref 70–99)

## 2020-12-15 LAB — BASIC METABOLIC PANEL
Anion gap: 13 (ref 5–15)
BUN: 38 mg/dL — ABNORMAL HIGH (ref 8–23)
CO2: 21 mmol/L — ABNORMAL LOW (ref 22–32)
Calcium: 7.3 mg/dL — ABNORMAL LOW (ref 8.9–10.3)
Chloride: 95 mmol/L — ABNORMAL LOW (ref 98–111)
Creatinine, Ser: 7.03 mg/dL — ABNORMAL HIGH (ref 0.44–1.00)
GFR, Estimated: 5 mL/min — ABNORMAL LOW (ref >60)
Glucose, Bld: 211 mg/dL — ABNORMAL HIGH (ref 70–99)
Potassium: 2.9 mmol/L — ABNORMAL LOW (ref 3.5–5.1)
Sodium: 129 mmol/L — ABNORMAL LOW (ref 135–145)

## 2020-12-15 LAB — CBC WITH DIFFERENTIAL/PLATELET
Abs Immature Granulocytes: 0.26 10*3/uL — ABNORMAL HIGH (ref 0.00–0.07)
Basophils Absolute: 0.1 10*3/uL (ref 0.0–0.1)
Basophils Relative: 0 %
Eosinophils Absolute: 0 10*3/uL (ref 0.0–0.5)
Eosinophils Relative: 0 %
HCT: 31.9 % — ABNORMAL LOW (ref 36.0–46.0)
Hemoglobin: 10.3 g/dL — ABNORMAL LOW (ref 12.0–15.0)
Immature Granulocytes: 1 %
Lymphocytes Relative: 2 %
Lymphs Abs: 0.6 10*3/uL — ABNORMAL LOW (ref 0.7–4.0)
MCH: 29.8 pg (ref 26.0–34.0)
MCHC: 32.3 g/dL (ref 30.0–36.0)
MCV: 92.2 fL (ref 80.0–100.0)
Monocytes Absolute: 1.7 10*3/uL — ABNORMAL HIGH (ref 0.1–1.0)
Monocytes Relative: 6 %
Neutro Abs: 26.9 10*3/uL — ABNORMAL HIGH (ref 1.7–7.7)
Neutrophils Relative %: 91 %
Platelets: 145 10*3/uL — ABNORMAL LOW (ref 150–400)
RBC: 3.46 MIL/uL — ABNORMAL LOW (ref 3.87–5.11)
RDW: 19.9 % — ABNORMAL HIGH (ref 11.5–15.5)
Smear Review: NORMAL
WBC: 29.4 10*3/uL — ABNORMAL HIGH (ref 4.0–10.5)
nRBC: 0.2 % (ref 0.0–0.2)

## 2020-12-15 LAB — PATHOLOGIST SMEAR REVIEW

## 2020-12-15 LAB — LIPASE, BLOOD: Lipase: 86 U/L — ABNORMAL HIGH (ref 11–51)

## 2020-12-15 LAB — MRSA PCR SCREENING: MRSA by PCR: NEGATIVE

## 2020-12-15 LAB — LACTIC ACID, PLASMA: Lactic Acid, Venous: 2.1 mmol/L (ref 0.5–1.9)

## 2020-12-15 SURGERY — VISCERAL ANGIOGRAPHY
Anesthesia: Moderate Sedation

## 2020-12-15 MED ORDER — ONDANSETRON HCL 4 MG/2ML IJ SOLN: 4.0000 mg | Freq: Four times a day (QID) | INTRAMUSCULAR | Status: DC | PRN

## 2020-12-15 MED ORDER — HYDROMORPHONE HCL 1 MG/ML IJ SOLN: 1.0000 mg | Freq: Once | INTRAMUSCULAR | Status: DC | PRN

## 2020-12-15 MED ORDER — FENTANYL CITRATE (PF) 100 MCG/2ML IJ SOLN
INTRAMUSCULAR | Status: AC
  Filled 2020-12-15: qty 2

## 2020-12-15 MED ORDER — VANCOMYCIN 50 MG/ML ORAL SOLUTION
125.0000 mg | Freq: Four times a day (QID) | ORAL | Status: DC
  Administered 2020-12-15 – 2020-12-17 (×8): 125 mg via ORAL
  Filled 2020-12-15 (×11): qty 2.5

## 2020-12-15 MED ORDER — VANCOMYCIN 50 MG/ML ORAL SOLUTION: 125.0000 mg | Freq: Four times a day (QID) | ORAL | Status: DC

## 2020-12-15 MED ORDER — NOREPINEPHRINE 4 MG/250ML-% IV SOLN: 0.0000 ug/min | INTRAVENOUS | Status: DC

## 2020-12-15 MED ORDER — SODIUM CHLORIDE 0.9 % IV SOLN
250.0000 mL | INTRAVENOUS | Status: DC
  Administered 2020-12-15: 250 mL via INTRAVENOUS

## 2020-12-15 MED ORDER — MIDAZOLAM HCL 2 MG/2ML IJ SOLN
INTRAMUSCULAR | Status: AC
  Filled 2020-12-15: qty 2

## 2020-12-15 MED ORDER — HEPARIN SODIUM (PORCINE) 1000 UNIT/ML IJ SOLN
INTRAMUSCULAR | Status: DC | PRN
  Administered 2020-12-15: 4000 [IU] via INTRAVENOUS

## 2020-12-15 MED ORDER — POTASSIUM CHLORIDE 10 MEQ/100ML IV SOLN
10.0000 meq | INTRAVENOUS | Status: AC
  Administered 2020-12-15 (×3): 10 meq via INTRAVENOUS
  Filled 2020-12-15 (×3): qty 100

## 2020-12-15 MED ORDER — DIPHENHYDRAMINE HCL 50 MG/ML IJ SOLN: 50.0000 mg | Freq: Once | INTRAMUSCULAR | Status: DC | PRN

## 2020-12-15 MED ORDER — MIDAZOLAM HCL 2 MG/ML PO SYRP: 8.0000 mg | ORAL_SOLUTION | Freq: Once | ORAL | Status: DC | PRN

## 2020-12-15 MED ORDER — SODIUM CHLORIDE 0.9 % IV SOLN: INTRAVENOUS | Status: DC

## 2020-12-15 MED ORDER — CEFAZOLIN SODIUM-DEXTROSE 1-4 GM/50ML-% IV SOLN: 1.0000 g | Freq: Once | INTRAVENOUS | Status: DC

## 2020-12-15 MED ORDER — FAMOTIDINE 20 MG PO TABS: 40.0000 mg | ORAL_TABLET | Freq: Once | ORAL | Status: DC | PRN

## 2020-12-15 MED ORDER — SODIUM CHLORIDE 0.9 % IV BOLUS
500.0000 mL | Freq: Once | INTRAVENOUS | Status: AC
  Administered 2020-12-15: 500 mL via INTRAVENOUS

## 2020-12-15 MED ORDER — IODIXANOL 320 MG/ML IV SOLN
INTRAVENOUS | Status: DC | PRN
  Administered 2020-12-15: 65 mL

## 2020-12-15 MED ORDER — VANCOMYCIN HCL 500 MG IV SOLR
500.0000 mg | Freq: Four times a day (QID) | Status: DC
  Filled 2020-12-15 (×2): qty 500

## 2020-12-15 MED ORDER — METHYLPREDNISOLONE SODIUM SUCC 125 MG IJ SOLR: 125.0000 mg | Freq: Once | INTRAMUSCULAR | Status: DC | PRN

## 2020-12-15 MED ORDER — NOREPINEPHRINE 4 MG/250ML-% IV SOLN
2.0000 ug/min | INTRAVENOUS | Status: DC
  Administered 2020-12-16: 2 ug/min via INTRAVENOUS
  Filled 2020-12-15: qty 250

## 2020-12-15 MED ORDER — HEPARIN SODIUM (PORCINE) 1000 UNIT/ML IJ SOLN
INTRAMUSCULAR | Status: AC
  Filled 2020-12-15: qty 1

## 2020-12-15 MED ORDER — MIDAZOLAM HCL 2 MG/2ML IJ SOLN
INTRAMUSCULAR | Status: DC | PRN
  Administered 2020-12-15: 0.5 mg via INTRAVENOUS

## 2020-12-15 MED ORDER — VANCOMYCIN HCL 1250 MG/250ML IV SOLN
1250.0000 mg | Freq: Once | INTRAVENOUS | Status: AC
  Administered 2020-12-15: 1250 mg via INTRAVENOUS
  Filled 2020-12-15: qty 250

## 2020-12-15 SURGICAL SUPPLY — 20 items
BALLN ULTRVRSE PTA 5X40X75C (BALLOONS) ×2
BALLOON ULTRVRSE PTA 5X40X75C (BALLOONS) IMPLANT
CATH ANGIO 5F 80CM MHK1-H (CATHETERS) ×1 IMPLANT
CATH ANGIO 5F PIGTAIL 65CM (CATHETERS) ×1 IMPLANT
COVER PROBE U/S 5X48 (MISCELLANEOUS) ×1 IMPLANT
DEVICE SAFEGUARD 24CM (GAUZE/BANDAGES/DRESSINGS) ×1 IMPLANT
DEVICE STARCLOSE SE CLOSURE (Vascular Products) ×1 IMPLANT
GLIDECATH 4FR STR (CATHETERS) ×1 IMPLANT
GLIDEWIRE ADV .035X180CM (WIRE) ×1 IMPLANT
GLIDEWIRE STIFF .35X180X3 HYDR (WIRE) ×1 IMPLANT
GUIDEWIRE SUPER STIFF .035X180 (WIRE) ×1 IMPLANT
GUIDEWIRE VERSACORE 175CM (WIRE) ×1 IMPLANT
KIT ENCORE 26 ADVANTAGE (KITS) ×1 IMPLANT
PACK ANGIOGRAPHY (CUSTOM PROCEDURE TRAY) ×2 IMPLANT
SHEATH ANL2 6FRX45 HC (SHEATH) ×1 IMPLANT
SHEATH BRITE TIP 5FRX11 (SHEATH) ×1 IMPLANT
STENT LIFESTREAM 6X37X80 (Permanent Stent) ×1 IMPLANT
SYR MEDRAD MARK 7 150ML (SYRINGE) ×1 IMPLANT
TUBING CONTRAST HIGH PRESS 72 (TUBING) ×1 IMPLANT
WIRE GUIDERIGHT .035X150 (WIRE) ×2 IMPLANT

## 2020-12-15 NOTE — Progress Notes (Signed)
[facility] Hospital Day(s): 3.   Post op day(s): Day of Surgery.   Interval History: Patient seen and examined, no acute events or new complaints overnight. Patient reports continued having abdominal pain.  Pain generalized without radiation to other part of the body.  No alleviating or aggravating factors at this moment.  A little more alert compared than yesterday.  Due to low blood pressure this morning patient was transferred to ICU.  Vital signs in last 24 hours: [min-max] current  Temp:  [97.4 F (36.3 C)-98.9 F (37.2 C)] 97.5 F (36.4 C) (02/21 1457) Pulse Rate:  [91-110] 102 (02/21 1535) Resp:  [15-30] 15 (02/21 1540) BP: (75-116)/(37-67) 110/63 (02/21 1540) SpO2:  [86 %-100 %] 97 % (02/21 1540) Weight:  [55 kg] 55 kg (02/21 0530)     Height: '5\' 6"'$  (167.6 cm) Weight: 55 kg BMI (Calculated): 19.58   Physical Exam:  Constitutional: alert, uncomfortable in pain. Respiratory: breathing non-labored at rest  Cardiovascular: regular rate and sinus rhythm  Gastrointestinal: soft, tender in all quadrants, and distended  Labs:  CBC Latest Ref Rng & Units 12/15/2020 12/14/2020 12/13/2020  WBC 4.0 - 10.5 K/uL 29.4(H) 38.6(H) 52.3(HH)  Hemoglobin 12.0 - 15.0 g/dL 10.3(L) 11.1(L) 12.1  Hematocrit 36.0 - 46.0 % 31.9(L) 34.1(L) 39.1  Platelets 150 - 400 K/uL 145(L) 183 206   CMP Latest Ref Rng & Units 12/15/2020 12/14/2020 12/13/2020  Glucose 70 - 99 mg/dL 211(H) 212(H) 216(H)  BUN 8 - 23 mg/dL 38(H) 37(H) 34(H)  Creatinine 0.44 - 1.00 mg/dL 7.03(H) 7.57(H) 7.95(H)  Sodium 135 - 145 mmol/L 129(L) 129(L) 130(L)  Potassium 3.5 - 5.1 mmol/L 2.9(L) 3.0(L) 3.2(L)  Chloride 98 - 111 mmol/L 95(L) 93(L) 93(L)  CO2 22 - 32 mmol/L 21(L) 20(L) 22  Calcium 8.9 - 10.3 mg/dL 7.3(L) 7.9(L) 7.1(L)  Total Protein 6.5 - 8.1 g/dL - - 4.9(L)  Total Bilirubin 0.3 - 1.2 mg/dL - - 0.5  Alkaline Phos 38 - 126 U/L - - 107  AST 15 - 41 U/L - - 24  ALT 0 - 44 U/L - - 17    Imaging studies:  No new pertinent imaging studies   Assessment/Plan:  80 y.o. female admitted with colitis with severe leukocytosis, complicated by pertinent comorbidities including end-stage renal disease on peritoneal dialysis, chronic anemia, hyperparathyroidism, hypertension, renal artery stenosis.  Patient evaluated this morning.  Patient continue with same clinical status.  After CTA rule it out ischemic findings I still suspect that the patient can be having a C. difficile infection which is consistent with a history of previous colitis treated with antibiotic therapy and coming with diarrhea and elevated white blood cell over 50,000.  Am not sure what is going to be the final route of the vancomycin.  Earlier this morning I saw a rectal vancomycin order but now I see an oral vancomycin order.  With the patient finding of ileus in the CT iron think an oral will be the best route and patient will benefit of rectal vancomycin.  The white blood cell count is trending down.  Patient without any fever.  Agree to replace electrolytes.  Keep patient hydrated.  Agree with ID consult.  I will follow the angiogram results in case there is any change.  Another alternative to figure out which type of colitis is to see if GI can do nature sigmoid to see if any sign of ischemia versus pseudomembranous colitis.  Arnold Long, MD

## 2020-12-15 NOTE — Care Management Important Message (Signed)
Important Message  Patient Details  Name: Anita Wells MRN: [id number] Date of Birth: 1941-04-26   Medicare Important Message Given:  Yes     Dannette Barbara 12/15/2020, 11:33 AM

## 2020-12-15 NOTE — Progress Notes (Signed)
Paged Dr. Eugenia Mcalpine and spoke with him and made him aware that patient has not needed to be started on levophed drip and that special procedures is asking for her to be brought down for angiogram. Asked if it was okay with him for patient to go for angiogram and Dr. Leslye Peer stated yes she can go.

## 2020-12-15 NOTE — Consult Note (Signed)
NAME: Anita Wells  DOB: 08-Aug-1941  MRN: [id number]  Date/Time: 12/15/2020 12:37 PM  REQUESTING PROVIDER: Dr. Leslye Peer Subjective:  REASON FOR CONSULT: Diarrhea due to C. difficile ?No history available from patient.  Chart reviewed. Anita Wells is a 80 y.o. with a history of DM, ESRD, HTN, HLD,  Presented to the ED on 12/05/20 with N/V/D  And LLQ pain. CT abdomen showed mild thickening of the distal transverse colon and proximal descending colon consistent with focal colitis. Her WBC was 13.6. CDiff was positive antigen , but neg toxin and PCR and was deemed a neg test . She was treated with Antibiotics, IV zosyn and then PO augmentin. Was discharged on 12/08/20 She returned to the ED on 2/18 with abdominal pain poor appetitie and N/V and diarrhea. She had gone to her PCP who sent her to the ED Pt is on PD Vitals in the ED 97.5, BP 111/76, HR 89 Wbc 29.6,  Blood culture sent Stool sent for C. difficile and it came back positive with antigen positive but toxin and PCR was negative.  CT of the abdomen showed scattered colonic wall thickening involving the ascending and descending colon as well as splenic flexure and questionable small bowel loops.  The differential diagnosis was inflammatory bowel disease versus infection versus ischemia.  She was started on vancomycin and cefepime.  She was also started on rectal vancomycin and IV Flagyl.  I am asked to see the patient to manage the diarrhea.  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  Dilated loops of small bowel without evidence of bowel obstruction or transition.  There was mild thickening of the wall of the transverse colon without distention or abnormal enhancement. There  was an incidental finding of fluid/tissue in the endometrial canal.  Past Medical History:  Diagnosis Date  . Anemia   . Barrett's esophagus   . Carotid stenosis, bilateral   . Chronic kidney disease   . GERD (gastroesophageal reflux disease)   . Hyperlipidemia   . Hyperparathyroidism ([place])   . Hypertension   . Renal artery stenosis (HCC)    left    Past Surgical History:  Procedure Laterality Date  . A/V FISTULAGRAM Left 02/19/2019   Procedure: A/V FISTULAGRAM;  Surgeon: Algernon Huxley, MD;  Location: [facility];  Service: Cardiovascular;  Laterality: Left;  . AV FISTULA PLACEMENT  12/21/2018   Procedure: ARTERIOVENOUS (AV) FISTULA CREATION;  Surgeon: Algernon Huxley, MD;  Location: [facility];  Service: Vascular;;  . COLON RESECTION    . COLON SURGERY    . colonoscpy    . DIALYSIS/PERMA CATHETER INSERTION N/A 11/15/2018   Procedure: DIALYSIS/PERMA CATHETER INSERTION;  Surgeon: Algernon Huxley, MD;  Location: [facility];  Service: Cardiovascular;  Laterality: N/A;  . DIALYSIS/PERMA CATHETER REMOVAL N/A 09/27/2019   Procedure: DIALYSIS/PERMA CATHETER REMOVAL;  Surgeon: Algernon Huxley, MD;  Location: [facility];  Service: Cardiovascular;  Laterality: N/A;  . RENAL ARTERY STENT Left     Social History   Socioeconomic History  . Marital status: Married    Spouse name: Not on file  . Number of children: Not on file  . Years of education: Not on file  . Highest education level:  Not on file  Occupational History  . Not on file  Tobacco Use  . Smoking status: Current Every Day Smoker    Packs/day: 0.25    Years: 50.00    Pack years: 12.50    Types: Cigarettes  . Smokeless tobacco: Never Used  . Tobacco comment: She states she is ready to quit 09/15/2020.  Vaping Use  . Vaping Use: Never used  Substance and Sexual Activity  . Alcohol use: Not Currently  . Drug use: Never  . Sexual activity: Not on file  Other Topics Concern  . Not on file  Social History  Narrative  . Not on file   Social Determinants of Health   Financial Resource Strain: Not on file  Food Insecurity: Not on file  Transportation Needs: Not on file  Physical Activity: Not on file  Stress: Not on file  Social Connections: Not on file  Intimate Partner Violence: Not on file    Family History  Problem Relation Age of Onset  . Diabetes Mellitus II Mother   . Hypertension Mother   . Alcohol abuse Father    Allergies  Allergen Reactions  . Clopidogrel Swelling    Mouth swelling  . Ace Inhibitors Cough  . Amlodipine Other (See Comments)    Peripheral edema (tolerates low dose)   I? Current Facility-Administered Medications  Medication Dose Route Frequency Provider Last Rate Last Admin  . 0.9 %  sodium chloride infusion   Intravenous PRN Loletha Grayer, MD   Stopped at 12/13/20 1220  . 0.9 %  sodium chloride infusion  250 mL Intravenous Continuous Wieting, Richard, MD      . acetaminophen (TYLENOL) tablet 650 mg  650 mg Oral Q6H PRN Loletha Grayer, MD   650 mg at 12/13/20 [id number]   Or  . acetaminophen (TYLENOL) suppository 650 mg  650 mg Rectal Q6H PRN Wieting, Richard, MD      . albuterol (VENTOLIN HFA) 108 (90 Base) MCG/ACT inhaler 2 puff  2 puff Inhalation Q6H PRN Wieting, Richard, MD      . aspirin chewable tablet 81 mg  81 mg Oral Daily Loletha Grayer, MD   81 mg at 12/13/20 0929  . Chlorhexidine Gluconate Cloth 2 % PADS 6 each  6 each Topical Daily Loletha Grayer, MD   6 each at 12/15/20 1156  . dialysis solution 1.5% low-MG/low-CA dianeal solution   Intraperitoneal Q24H Candiss Norse, Harmeet, MD      . escitalopram (LEXAPRO) tablet 10 mg  10 mg Oral Daily Loletha Grayer, MD   10 mg at 12/13/20 0952  . fluticasone furoate-vilanterol (BREO ELLIPTA) 100-25 MCG/INH 1 puff  1 puff Inhalation Daily Hallaji, Sheema M, RPH       And  . umeclidinium bromide (INCRUSE ELLIPTA) 62.5 MCG/INH 1 puff  1 puff Inhalation Daily Hallaji, Sheema M, RPH      . gentamicin cream  (GARAMYCIN) 0.1 % 1 application  1 application Topical Daily Murlean Iba, MD   1 application at 0000000 1517  . heparin 1000 unit/ml injection 500 Units  500 Units Intraperitoneal PRN Murlean Iba, MD      . heparin injection 5,000 Units  5,000 Units Subcutaneous Q8H Loletha Grayer, MD   5,000 Units at 12/14/20 2234  . insulin aspart (novoLOG) injection 0-5 Units  0-5 Units Subcutaneous QHS Wieting, Richard, MD      . insulin aspart (novoLOG) injection 0-6 Units  0-6 Units Subcutaneous TID WC Loletha Grayer, MD      . lactated  ringers infusion   Intravenous Continuous Murlean Iba, MD 75 mL/hr at 12/15/20 0736 New Bag at 12/15/20 0736  . mirtazapine (REMERON) tablet 7.5 mg  7.5 mg Oral QHS Loletha Grayer, MD   7.5 mg at 12/13/20 2303  . morphine 2 MG/ML injection 1 mg  1 mg Intravenous Q4H PRN Wieting, Richard, MD      . norepinephrine (LEVOPHED) '4mg'$  in 242m premix infusion  2-10 mcg/min Intravenous Titrated Wieting, Richard, MD      . ondansetron ([facility] tablet 4 mg  4 mg Oral Q6H PRN Wieting, Richard, MD       Or  . ondansetron (ZOFRAN) injection 4 mg  4 mg Intravenous Q6H PRN Wieting, Richard, MD      . oxyCODONE-acetaminophen (PERCOCET/ROXICET) 5-325 MG per tablet 1 tablet  1 tablet Oral Q8H PRN Wieting, Richard, MD      . pantoprazole (PROTONIX) injection 40 mg  40 mg Intravenous Q24H WLoletha Grayer MD   40 mg at 12/14/20 1500  . piperacillin-tazobactam (ZOSYN) IVPB 2.25 g  2.25 g Intravenous Q8H Rauer, Samantha O, RPH 100 mL/hr at 12/15/20 0532 2.25 g at 12/15/20 0532  . potassium chloride 10 mEq in 100 mL IVPB  10 mEq Intravenous Q1 Hr x 3 Wieting, Richard, MD 100 mL/hr at 12/15/20 1133 10 mEq at 12/15/20 1133  . potassium chloride SA (KLOR-CON) CR tablet 20 mEq  20 mEq Oral Daily Wieting, Richard, MD      . vancomycin (VANCOCIN) 500 mg in sodium chloride irrigation 0.9 % 100 mL ENEMA  500 mg Rectal Q6H Wieting, Richard, MD      . vancomycin (VANCOREADY) IVPB 1250 mg/250  mL  1,250 mg Intravenous Once WLoletha Grayer MD         Abtx:  Anti-infectives (From admission, onward)   Start     Dose/Rate Route Frequency Ordered Stop   12/15/20 1200  vancomycin (VANCOCIN) 500 mg in sodium chloride irrigation 0.9 % 100 mL ENEMA        500 mg Rectal Every 6 hours 12/15/20 1031 12/25/20 1159   12/15/20 1200  vancomycin (VANCOREADY) IVPB 1250 mg/250 mL        1,250 mg 166.7 mL/hr over 90 Minutes Intravenous  Once 12/15/20 1057     12/15/20 1115  vancomycin (VANCOCIN) 50 mg/mL oral solution 125 mg  Status:  Discontinued        125 mg Oral 4 times daily 12/15/20 1029 12/15/20 1030   12/14/20 1445  vancomycin (VANCOCIN) 50 mg/mL oral solution 125 mg  Status:  Discontinued        125 mg Per Tube 4 times daily 12/14/20 1356 12/15/20 1032   12/14/20 1330  piperacillin-tazobactam (ZOSYN) IVPB 2.25 g        2.25 g 100 mL/hr over 30 Minutes Intravenous Every 8 hours 12/14/20 1249     12/12/20 2300  metroNIDAZOLE (FLAGYL) IVPB 500 mg  Status:  Discontinued        500 mg 100 mL/hr over 60 Minutes Intravenous Every 8 hours 12/12/20 1734 12/14/20 1248   12/12/20 2200  cefTRIAXone (ROCEPHIN) 2 g in sodium chloride 0.9 % 100 mL IVPB  Status:  Discontinued        2 g 200 mL/hr over 30 Minutes Intravenous Every 24 hours 12/12/20 1734 12/14/20 1201   12/12/20 1515  vancomycin (VANCOREADY) IVPB 1250 mg/250 mL        1,250 mg 166.7 mL/hr over 90 Minutes Intravenous  Once 12/12/20 1505 12/12/20 1818  12/12/20 1500  ceFEPIme (MAXIPIME) 2 g in sodium chloride 0.9 % 100 mL IVPB        2 g 200 mL/hr over 30 Minutes Intravenous  Once 12/12/20 1459 12/12/20 1625   12/12/20 1500  metroNIDAZOLE (FLAGYL) IVPB 500 mg        500 mg 100 mL/hr over 60 Minutes Intravenous  Once 12/12/20 1459 12/12/20 1731   12/12/20 1500  vancomycin (VANCOCIN) IVPB 1000 mg/200 mL premix  Status:  Discontinued        1,000 mg 200 mL/hr over 60 Minutes Intravenous  Once 12/12/20 1459 12/12/20 1505       REVIEW OF SYSTEMS:  No history available from patient she is sleeping after 0.5 mg of benzo  Objective:  VITALS:  BP (!) 111/50   Pulse (!) 106   Temp 98.9 F (37.2 C) (Axillary)   Resp (!) 30   Ht '5\' 6"'$  (1.676 m)   Wt 55 kg   SpO2 95%   BMI 19.57 kg/m  PHYSICAL EXAM:  General: Sedated Head: Normocephalic, without obvious abnormality, atraumatic. Eyes: Conjunctivae clear, anicteric sclerae. Pupils are equal ENT cannot examine Neck: Supple, symmetrical, no adenopathy, thyroid: non tender no carotid bruit and no JVD. Back: Did not examine Lungs: Bilateral air entry Heart: Tachycardia Abdomen: PD catheter in place Laparotomy scar Extremities: atraumatic, no cyanosis. No edema. No clubbing Skin: No rashes or lesions. Or bruising Neurologic: Cannot be assessed Pertinent Labs Lab Results CBC    Component Value Date/Time   WBC 29.4 (H) 12/15/2020 0735   RBC 3.46 (L) 12/15/2020 0735   HGB 10.3 (L) 12/15/2020 0735   HCT 31.9 (L) 12/15/2020 0735   PLT 145 (L) 12/15/2020 0735   MCV 92.2 12/15/2020 0735   MCH 29.8 12/15/2020 0735   MCHC 32.3 12/15/2020 0735   RDW 19.9 (H) 12/15/2020 0735   LYMPHSABS 0.6 (L) 12/15/2020 0735   MONOABS 1.7 (H) 12/15/2020 0735   EOSABS 0.0 12/15/2020 0735   BASOSABS 0.1 12/15/2020 0735    CMP Latest Ref Rng & Units 12/15/2020 12/14/2020 12/13/2020  Glucose 70 - 99 mg/dL 211(H) 212(H) 216(H)  BUN 8 - 23 mg/dL 38(H) 37(H) 34(H)  Creatinine 0.44 - 1.00 mg/dL 7.03(H) 7.57(H) 7.95(H)  Sodium 135 - 145 mmol/L 129(L) 129(L) 130(L)  Potassium 3.5 - 5.1 mmol/L 2.9(L) 3.0(L) 3.2(L)  Chloride 98 - 111 mmol/L 95(L) 93(L) 93(L)  CO2 22 - 32 mmol/L 21(L) 20(L) 22  Calcium 8.9 - 10.3 mg/dL 7.3(L) 7.9(L) 7.1(L)  Total Protein 6.5 - 8.1 g/dL - - 4.9(L)  Total Bilirubin 0.3 - 1.2 mg/dL - - 0.5  Alkaline Phos 38 - 126 U/L - - 107  AST 15 - 41 U/L - - 24  ALT 0 - 44 U/L - - 17      Microbiology: Recent Results (from the past 240 hour(s))   Gastrointestinal Panel by PCR , Stool     Status: None   Collection Time: 12/05/20  4:40 PM   Specimen: Stool  Result Value Ref Range Status   Campylobacter species NOT DETECTED NOT DETECTED Final   Plesimonas shigelloides NOT DETECTED NOT DETECTED Final   Salmonella species NOT DETECTED NOT DETECTED Final   Yersinia enterocolitica NOT DETECTED NOT DETECTED Final   Vibrio species NOT DETECTED NOT DETECTED Final   Vibrio cholerae NOT DETECTED NOT DETECTED Final   Enteroaggregative E coli (EAEC) NOT DETECTED NOT DETECTED Final   Enteropathogenic E coli (EPEC) NOT DETECTED NOT DETECTED Final   Enterotoxigenic  E coli (ETEC) NOT DETECTED NOT DETECTED Final   Shiga like toxin producing E coli (STEC) NOT DETECTED NOT DETECTED Final   Shigella/Enteroinvasive E coli (EIEC) NOT DETECTED NOT DETECTED Final   Cryptosporidium NOT DETECTED NOT DETECTED Final   Cyclospora cayetanensis NOT DETECTED NOT DETECTED Final   Entamoeba histolytica NOT DETECTED NOT DETECTED Final   Giardia lamblia NOT DETECTED NOT DETECTED Final   Adenovirus F40/41 NOT DETECTED NOT DETECTED Final   Astrovirus NOT DETECTED NOT DETECTED Final   Norovirus GI/GII NOT DETECTED NOT DETECTED Final   Rotavirus A NOT DETECTED NOT DETECTED Final   Sapovirus (I, II, IV, and V) NOT DETECTED NOT DETECTED Final    Comment: Performed at [facility], [address] [place], [place] [address]  Culture, blood (routine x 2)     Status: None (Preliminary result)   Collection Time: 12/12/20  1:50 PM   Specimen: BLOOD  Result Value Ref Range Status   Specimen Description BLOOD BLOOD RIGHT ARM  Final   Special Requests   Final    BOTTLES DRAWN AEROBIC AND ANAEROBIC Blood Culture adequate volume   Culture   Final    NO GROWTH 3 DAYS Performed at [facility], [address] [place], [place] [address]    Report Status PENDING  Incomplete  SARS CORONAVIRUS 2 (TAT 6-24 HRS) Nasopharyngeal Nasopharyngeal Swab     Status:  None   Collection Time: 12/12/20  2:30 PM   Specimen: Nasopharyngeal Swab  Result Value Ref Range Status   SARS Coronavirus 2 NEGATIVE NEGATIVE Final    Comment: (NOTE) SARS-CoV-2 target nucleic acids are NOT DETECTED.  The SARS-CoV-2 RNA is generally detectable in upper and lower respiratory specimens during the acute phase of infection. Negative results do not preclude SARS-CoV-2 infection, do not rule out co-infections with other pathogens, and should not be used as the sole basis for treatment or other patient management decisions. Negative results must be combined with clinical observations, patient history, and epidemiological information. The expected result is Negative.  Fact Sheet for Patients: SugarRoll.be  Fact Sheet for Healthcare Providers: https://www.woods-mathews.com/  This test is not yet approved or cleared by the [place] FDA and  has been authorized for detection and/or diagnosis of SARS-CoV-2 by FDA under an Emergency Use Authorization (EUA). This EUA will remain  in effect (meaning this test can be used) for the duration of the COVID-19 declaration under Se ction 564(b)(1) of the Act, 21 U.S.C. section 360bbb-3(b)(1), unless the authorization is terminated or revoked sooner.  Performed at [facility], [place] [address], [place], [place] [address]   Culture, blood (routine x 2)     Status: None (Preliminary result)   Collection Time: 12/12/20  2:56 PM   Specimen: BLOOD  Result Value Ref Range Status   Specimen Description BLOOD BLOOD RIGHT FOREARM  Final   Special Requests   Final    BOTTLES DRAWN AEROBIC AND ANAEROBIC Blood Culture adequate volume   Culture   Final    NO GROWTH 3 DAYS Performed at [facility], [address], [place], [place] [address]    Report Status PENDING  Incomplete  Gastrointestinal Panel by PCR , Stool     Status: Abnormal   Collection Time: 12/13/20  9:40 AM    Specimen: Stool  Result Value Ref Range Status   Campylobacter species NOT DETECTED NOT DETECTED Final   Plesimonas shigelloides NOT DETECTED NOT DETECTED Final   Salmonella species NOT DETECTED  NOT DETECTED Final   Yersinia enterocolitica NOT DETECTED NOT DETECTED Final   Vibrio species NOT DETECTED NOT DETECTED Final   Vibrio cholerae NOT DETECTED NOT DETECTED Final   Enteroaggregative E coli (EAEC) NOT DETECTED NOT DETECTED Final   Enteropathogenic E coli (EPEC) NOT DETECTED NOT DETECTED Final   Enterotoxigenic E coli (ETEC) NOT DETECTED NOT DETECTED Final   Shiga like toxin producing E coli (STEC) NOT DETECTED NOT DETECTED Final   Shigella/Enteroinvasive E coli (EIEC) NOT DETECTED NOT DETECTED Final   Cryptosporidium NOT DETECTED NOT DETECTED Final   Cyclospora cayetanensis NOT DETECTED NOT DETECTED Final   Entamoeba histolytica NOT DETECTED NOT DETECTED Final   Giardia lamblia NOT DETECTED NOT DETECTED Final   Adenovirus F40/41 DETECTED (A) NOT DETECTED Final   Astrovirus NOT DETECTED NOT DETECTED Final   Norovirus GI/GII NOT DETECTED NOT DETECTED Final   Rotavirus A NOT DETECTED NOT DETECTED Final   Sapovirus (I, II, IV, and V) NOT DETECTED NOT DETECTED Final    Comment: Performed at [facility], [address] [place], [place] [address]  C Difficile Quick Screen (NO PCR Reflex)     Status: Abnormal   Collection Time: 12/13/20  9:40 AM   Specimen: STOOL  Result Value Ref Range Status   C Diff antigen POSITIVE (A) NEGATIVE Final   C Diff toxin NEGATIVE NEGATIVE Final   C Diff interpretation   Final    Results are indeterminate. Please contact the provider listed for your campus for C diff questions in [place].    Comment: Performed at [facility], [address] [place], [place] [address]  C. Diff by PCR, Reflexed     Status: None   Collection Time: 12/13/20  9:40 AM  Result Value Ref Range Status   Toxigenic C. Difficile by PCR NEGATIVE NEGATIVE Final     Comment: Patient is colonized with non toxigenic C. difficile. May not need treatment unless significant symptoms are present. Performed at [facility], [address] [place], [place] [address]     IMAGING RESULTS: CT abdomen and CT angio as above ? Impression/Recommendation ? 80 year old female presenting with abdominal pain and diarrhea for a few days along with high WBC and lactate. CT abdomen shows dilated loops of small intestine and thickening of the transverse colon.  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  No culture has been sent so we will order 1 C. difficile: Not likely as the antigen alone is positive but toxin and PCR negative. Until things get a little clear if it is okay to continue the vancomycin by mouth and by rectum if she cannot take it by mouth.  We will continue IV Vanco and Zosyn.    End-stage renal disease on peritoneal dialysis  Hypertension ___________________________________________________ Discussed management with the nurse and the provider Note:  This document was prepared using Dragon voice recognition software and may include unintentional dictation errors.

## 2020-12-15 NOTE — Consult Note (Addendum)
Pharmacy Antibiotic Note  Anita Wells is a 80 y.o. female admitted on 12/12/2020 with PERITONITIS.  Pharmacy has been consulted for pip/tazo and vancomycin dosing.  - WBC 29.4, afeb,  - c.diff antigen positive, toxin negative. - surgery wants to continued PO vanco. X 10 days total - adenovirus in GI panel  - received ceftriaxone + flagyl (2/18 > 2/20) switched to pip/tazo. Adding vancomycin for empiric coverage.   - plan for visceral angiography 2/21.   Plan: Will give vancomycin 1250 mg loading dose. Goal trough 15-20. Plan to order vancomycin level in 3 days.   PO vancomycin switched to rectal dosing x 10 days per surgery.   Pip/tazo 2.25 g q8H.   Height: '5\' 6"'$  (167.6 cm) Weight: 55 kg (121 lb 4.1 oz) IBW/kg (Calculated) : 59.3  Temp (24hrs), Avg:97.9 F (36.6 C), Min:97.4 F (36.3 C), Max:98.7 F (37.1 C)  Recent Labs  Lab 12/12/20 1241 12/12/20 1343 12/12/20 1350 12/12/20 1644 12/13/20 0427 12/13/20 0428 12/14/20 0458 12/15/20 0735  WBC 29.8*  --   --   --  52.3* 52.3* 38.6* 29.4*  CREATININE  --   --  9.12*  --  7.95*  --  7.57* 7.03*  LATICACIDVEN  --  5.6*  --  3.0*  --   --  3.7*  --     Estimated Creatinine Clearance: 5.4 mL/min (A) (by C-G formula based on SCr of 7.03 mg/dL (H)).    Allergies  Allergen Reactions  . Clopidogrel Swelling    Mouth swelling  . Ace Inhibitors Cough  . Amlodipine Other (See Comments)    Peripheral edema (tolerates low dose)    Microbiology results: 2/18 BCx: pending  Thank you for allowing pharmacy to be a part of this patient's care.  Oswald Hillock, PharmD, BCPS 12/15/2020 10:41 AM

## 2020-12-15 NOTE — TOC Initial Note (Signed)
Transition of Care [facility]) - Initial/Assessment Note    Patient Details  Name: GENESISS BIEKER MRN: [id number] Date of Birth: 09-09-1941  [facility]) CM/SW Contact:    Beverly Sessions, RN Phone Number: 12/15/2020, 1:26 PM  Clinical Narrative:                   Notified by MD that patient's daughter Valori Nur is stationed in [place] and would like [employer] to be notified in order to assist to get her home to see her mother.  Spoke with Electrical engineer at [employer].  Daughter in [address] [facility].  This information was provided to [facility].  She will be reaching out directly to family to obtain the rest of the required documentation.  Per Fraser Din no further information is needed from the hospital to proceed,  Dr Leslye Peer spoke with Fraser Din from the [employer] as well        Patient Goals and CMS Choice        Expected Discharge Plan and Services                                                Prior Living Arrangements/Services                       Activities of Daily Living Home Assistive Devices/Equipment: None ADL Screening (condition at time of admission) Patient's cognitive ability adequate to safely complete daily activities?: Yes Is the patient deaf or have difficulty hearing?: No Does the patient have difficulty seeing, even when wearing glasses/contacts?: No Does the patient have difficulty concentrating, remembering, or making decisions?: No Patient able to express need for assistance with ADLs?: Yes Does the patient have difficulty dressing or bathing?: No Independently performs ADLs?: Yes (appropriate for developmental age) Does the patient have difficulty walking or climbing stairs?: Yes Weakness of Legs: None Weakness of Arms/Hands: None  Permission Sought/Granted                  Emotional Assessment              Admission diagnosis:  Colitis [K52.9] Generalized abdominal pain [R10.84] Patient Active Problem List   Diagnosis Date Noted   . Acute metabolic encephalopathy   . Lactic acidosis   . Acute pancreatitis without infection or necrosis   . Hypotension   . Colitis 12/12/2020  . Generalized abdominal pain   . Elevated lipase   . Chronic obstructive pulmonary disease ([place])   . Acute colitis 12/05/2020  . Type 2 diabetes mellitus with ESRD (end-stage renal disease) ([place]) 12/05/2020  . Sepsis ([place]) 12/05/2020  . Hyponatremia 12/05/2020  . GERD (gastroesophageal reflux disease) 12/05/2020  . Depression 12/05/2020  . Tobacco abuse 12/05/2020  . Anemia in ESRD (end-stage renal disease) ([place]) 12/05/2020  . Hypomagnesemia 12/05/2020  . Type 2 diabetes mellitus with hyperosmolar hyperglycemic state (HHS) ([place]) 12/11/2019  . Fluid overload 12/11/2019  . Hypokalemia 12/11/2019  . Leukocytosis 12/11/2019  . ESRD on peritoneal dialysis ([place])   . Shortness of breath 07/27/2019  . ESRD on dialysis ([place]) 11/22/2018  . Essential hypertension 11/22/2018  . Hyperlipidemia 11/22/2018  . Anemia due to chronic kidney disease 11/12/2018   PCP:  Dion Body, MD Pharmacy:   [id number] [place], [address]  [place] [address] Phone: [phone] Fax: [phone]     Social Determinants of Health (SDOH) Interventions    Readmission Risk Interventions Readmission Risk Prevention Plan 12/13/2019  Transportation Screening Complete  Medication Review Press photographer) Complete  PCP or Specialist appointment within 3-5 days of discharge Complete  HRI or [place] Not Applicable  Some recent data might be hidden

## 2020-12-15 NOTE — Progress Notes (Addendum)
Patient ID: Anita Wells, female   DOB: 12/26/1940, 80 y.o.   MRN: [id number] [facility] PROGRESS NOTE  Anita Wells [id number] DOB: [birth year] DOA: 12/12/2020 PCP: Dion Body, MD  HPI/Subjective: Patient answered a few questions but was in severe pain with her abdomen.  Speaking mostly with family at the bedside and on the phone.  Transferring to the ICU for hypotension.  Admitted with acute colitis.  Objective: Vitals:   12/15/20 0530 12/15/20 0754  BP: (!) 96/53 (!) 79/67  Pulse: (!) 105 91  Resp: 20 20  Temp: 98.5 F (36.9 C) 97.9 F (36.6 C)  SpO2: 97% 100%    Intake/Output Summary (Last 24 hours) at 12/15/2020 1032 Last data filed at 12/15/2020 0259 Gross per 24 hour  Intake 2008.76 ml  Output -  Net 2008.76 ml   Filed Weights   12/12/20 1220 12/15/20 0530  Weight: 54.4 kg 55 kg    ROS: Review of Systems  Unable to perform ROS: Acuity of condition  Gastrointestinal: Positive for abdominal pain.   Exam: Physical Exam HENT:     Head: Normocephalic.     Mouth/Throat:     Comments: Unable to look into mouth Eyes:     General: Lids are normal.     Conjunctiva/sclera: Conjunctivae normal.  Cardiovascular:     Rate and Rhythm: Normal rate and regular rhythm.     Heart sounds: Normal heart sounds, S1 normal and S2 normal.  Pulmonary:     Breath sounds: Normal breath sounds. No decreased breath sounds, wheezing, rhonchi or rales.  Abdominal:     Palpations: Abdomen is soft.     Tenderness: There is generalized abdominal tenderness.  Musculoskeletal:     Right lower leg: No swelling.     Left lower leg: No swelling.  Skin:    General: Skin is warm.     Findings: No rash.  Neurological:     Mental Status: She is lethargic.       Data Reviewed: Basic Metabolic Panel: Recent Labs  Lab 12/12/20 1350 12/13/20 0427 12/14/20 0458 12/15/20 0735  NA 130* 130* 129* 129*  K 3.4* 3.2* 3.0* 2.9*  CL 89* 93* 93* 95*  CO2 20* 22 20* 21*   GLUCOSE 136* 216* 212* 211*  BUN 33* 34* 37* 38*  CREATININE 9.12* 7.95* 7.57* 7.03*  CALCIUM 7.7* 7.1* 7.9* 7.3*  MG 1.6*  --   --   --    Liver Function Tests: Recent Labs  Lab 12/12/20 1350 12/13/20 0427  AST 29 24  ALT 20 17  ALKPHOS 128* 107  BILITOT 0.4 0.5  PROT 5.3* 4.9*  ALBUMIN 2.0* 1.6*   Recent Labs  Lab 12/12/20 1350 12/13/20 0427 12/14/20 0458 12/15/20 0735  LIPASE 836* 924* 257* 86*   CBC: Recent Labs  Lab 12/12/20 1241 12/13/20 0427 12/13/20 0428 12/14/20 0458 12/15/20 0735  WBC 29.8* 52.3* 52.3* 38.6* 29.4*  NEUTROABS  --   --  48.8*  --  26.9*  HGB 14.4 12.1 12.1 11.1* 10.3*  HCT 46.0 39.1 39.1 34.1* 31.9*  MCV 94.8 95.1 95.6 92.2 92.2  PLT 293 206 206 183 145*    CBG: Recent Labs  Lab 12/14/20 0755 12/14/20 1246 12/14/20 1710 12/14/20 2159 12/15/20 0723  GLUCAP 152* 160* 124* 191* 167*    Recent Results (from the past 240 hour(s))  SARS CORONAVIRUS 2 (TAT 6-24 HRS) Nasopharyngeal Nasopharyngeal Swab     Status: None   Collection Time: 12/05/20 10:40 AM  Specimen: Nasopharyngeal Swab  Result Value Ref Range Status   SARS Coronavirus 2 NEGATIVE NEGATIVE Final    Comment: (NOTE) SARS-CoV-2 target nucleic acids are NOT DETECTED.  The SARS-CoV-2 RNA is generally detectable in upper and lower respiratory specimens during the acute phase of infection. Negative results do not preclude SARS-CoV-2 infection, do not rule out co-infections with other pathogens, and should not be used as the sole basis for treatment or other patient management decisions. Negative results must be combined with clinical observations, patient history, and epidemiological information. The expected result is Negative.  Fact Sheet for Patients: SugarRoll.be  Fact Sheet for Healthcare Providers: https://www.woods-mathews.com/  This test is not yet approved or cleared by the [place] FDA and  has been  authorized for detection and/or diagnosis of SARS-CoV-2 by FDA under an Emergency Use Authorization (EUA). This EUA will remain  in effect (meaning this test can be used) for the duration of the COVID-19 declaration under Se ction 564(b)(1) of the Act, 21 U.S.C. section 360bbb-3(b)(1), unless the authorization is terminated or revoked sooner.  Performed at [facility], [place] [address] [place], [place] [address]   Culture, blood (Routine X 2) w Reflex to ID Panel     Status: None   Collection Time: 12/05/20 11:30 AM   Specimen: BLOOD  Result Value Ref Range Status   Specimen Description BLOOD RIGHT ANTECUBITAL  Final   Special Requests   Final    BOTTLES DRAWN AEROBIC AND ANAEROBIC Blood Culture results may not be optimal due to an inadequate volume of blood received in culture bottles   Culture   Final    NO GROWTH 5 DAYS Performed at [facility], [address] [place], [place] [address]    Report Status 12/10/2020 FINAL  Final  Culture, blood (Routine X 2) w Reflex to ID Panel     Status: None   Collection Time: 12/05/20 11:30 AM   Specimen: BLOOD  Result Value Ref Range Status   Specimen Description BLOOD BLOOD RIGHT WRIST  Final   Special Requests   Final    BOTTLES DRAWN AEROBIC AND ANAEROBIC Blood Culture results may not be optimal due to an inadequate volume of blood received in culture bottles   Culture   Final    NO GROWTH 5 DAYS Performed at [facility], [address] [place], [place] [address]    Report Status 12/10/2020 FINAL  Final  Gastrointestinal Panel by PCR , Stool     Status: None   Collection Time: 12/05/20  4:40 PM   Specimen: Stool  Result Value Ref Range Status   Campylobacter species NOT DETECTED NOT DETECTED Final   Plesimonas shigelloides NOT DETECTED NOT DETECTED Final   Salmonella species NOT DETECTED NOT DETECTED Final   Yersinia enterocolitica NOT DETECTED NOT DETECTED Final   Vibrio species NOT DETECTED NOT  DETECTED Final   Vibrio cholerae NOT DETECTED NOT DETECTED Final   Enteroaggregative E coli (EAEC) NOT DETECTED NOT DETECTED Final   Enteropathogenic E coli (EPEC) NOT DETECTED NOT DETECTED Final   Enterotoxigenic E coli (ETEC) NOT DETECTED NOT DETECTED Final   Shiga like toxin producing E coli (STEC) NOT DETECTED NOT DETECTED Final   Shigella/Enteroinvasive E coli (EIEC) NOT DETECTED NOT DETECTED Final   Cryptosporidium NOT DETECTED NOT DETECTED Final   Cyclospora cayetanensis NOT DETECTED NOT DETECTED Final   Entamoeba histolytica NOT DETECTED NOT DETECTED Final   Giardia lamblia NOT DETECTED NOT DETECTED Final  Adenovirus F40/41 NOT DETECTED NOT DETECTED Final   Astrovirus NOT DETECTED NOT DETECTED Final   Norovirus GI/GII NOT DETECTED NOT DETECTED Final   Rotavirus A NOT DETECTED NOT DETECTED Final   Sapovirus (I, II, IV, and V) NOT DETECTED NOT DETECTED Final    Comment: Performed at [facility], [address] [place], [place] [address]  Culture, blood (routine x 2)     Status: None (Preliminary result)   Collection Time: 12/12/20  1:50 PM   Specimen: BLOOD  Result Value Ref Range Status   Specimen Description BLOOD BLOOD RIGHT ARM  Final   Special Requests   Final    BOTTLES DRAWN AEROBIC AND ANAEROBIC Blood Culture adequate volume   Culture   Final    NO GROWTH 3 DAYS Performed at [facility], [place], [place] [address]    Report Status PENDING  Incomplete  SARS CORONAVIRUS 2 (TAT 6-24 HRS) Nasopharyngeal Nasopharyngeal Swab     Status: None   Collection Time: 12/12/20  2:30 PM   Specimen: Nasopharyngeal Swab  Result Value Ref Range Status   SARS Coronavirus 2 NEGATIVE NEGATIVE Final    Comment: (NOTE) SARS-CoV-2 target nucleic acids are NOT DETECTED.  The SARS-CoV-2 RNA is generally detectable in upper and lower respiratory specimens during the acute phase of infection. Negative results do not preclude SARS-CoV-2 infection, do  not rule out co-infections with other pathogens, and should not be used as the sole basis for treatment or other patient management decisions. Negative results must be combined with clinical observations, patient history, and epidemiological information. The expected result is Negative.  Fact Sheet for Patients: SugarRoll.be  Fact Sheet for Healthcare Providers: https://www.woods-mathews.com/  This test is not yet approved or cleared by the [place] FDA and  has been authorized for detection and/or diagnosis of SARS-CoV-2 by FDA under an Emergency Use Authorization (EUA). This EUA will remain  in effect (meaning this test can be used) for the duration of the COVID-19 declaration under Se ction 564(b)(1) of the Act, 21 U.S.C. section 360bbb-3(b)(1), unless the authorization is terminated or revoked sooner.  Performed at [facility], [place] [address] [place], [place] [address]   Culture, blood (routine x 2)     Status: None (Preliminary result)   Collection Time: 12/12/20  2:56 PM   Specimen: BLOOD  Result Value Ref Range Status   Specimen Description BLOOD BLOOD RIGHT FOREARM  Final   Special Requests   Final    BOTTLES DRAWN AEROBIC AND ANAEROBIC Blood Culture adequate volume   Culture   Final    NO GROWTH 3 DAYS Performed at [facility], [address] [place],  [address]    Report Status PENDING  Incomplete  Gastrointestinal Panel by PCR , Stool     Status: Abnormal   Collection Time: 12/13/20  9:40 AM   Specimen: Stool  Result Value Ref Range Status   Campylobacter species NOT DETECTED NOT DETECTED Final   Plesimonas shigelloides NOT DETECTED NOT DETECTED Final   Salmonella species NOT DETECTED NOT DETECTED Final   Yersinia enterocolitica NOT DETECTED NOT DETECTED Final   Vibrio species NOT DETECTED NOT DETECTED Final   Vibrio cholerae NOT DETECTED NOT DETECTED Final   Enteroaggregative E coli  (EAEC) NOT DETECTED NOT DETECTED Final   Enteropathogenic E coli (EPEC) NOT DETECTED NOT DETECTED Final   Enterotoxigenic E coli (ETEC) NOT DETECTED NOT DETECTED Final   Shiga like toxin producing E  coli (STEC) NOT DETECTED NOT DETECTED Final   Shigella/Enteroinvasive E coli (EIEC) NOT DETECTED NOT DETECTED Final   Cryptosporidium NOT DETECTED NOT DETECTED Final   Cyclospora cayetanensis NOT DETECTED NOT DETECTED Final   Entamoeba histolytica NOT DETECTED NOT DETECTED Final   Giardia lamblia NOT DETECTED NOT DETECTED Final   Adenovirus F40/41 DETECTED (A) NOT DETECTED Final   Astrovirus NOT DETECTED NOT DETECTED Final   Norovirus GI/GII NOT DETECTED NOT DETECTED Final   Rotavirus A NOT DETECTED NOT DETECTED Final   Sapovirus (I, II, IV, and V) NOT DETECTED NOT DETECTED Final    Comment: Performed at [facility], [address] [place], [place] [address]  C Difficile Quick Screen (NO PCR Reflex)     Status: Abnormal   Collection Time: 12/13/20  9:40 AM   Specimen: STOOL  Result Value Ref Range Status   C Diff antigen POSITIVE (A) NEGATIVE Final   C Diff toxin NEGATIVE NEGATIVE Final   C Diff interpretation   Final    Results are indeterminate. Please contact the provider listed for your campus for C diff questions in [place].    Comment: Performed at [facility], [address] [place], [place] [address]  C. Diff by PCR, Reflexed     Status: None   Collection Time: 12/13/20  9:40 AM  Result Value Ref Range Status   Toxigenic C. Difficile by PCR NEGATIVE NEGATIVE Final    Comment: Patient is colonized with non toxigenic C. difficile. May not need treatment unless significant symptoms are present. Performed at [facility], [address] [place], [place] [address]      Studies: CT Angio Abd/Pel w/ and/or w/o  Addendum Date: 12/14/2020   ADDENDUM REPORT: 12/14/2020 13:47 ADDENDUM: Addendum created after discussing results with surgical team, Dr.  Peyton Najjar. Dilated loops of distal small bowel without evidence of bowel obstruction/transition. In addition, there is redemonstration of mild wall thickening of transverse colon, without distension or abnormal enhancement. Either of these findings may be seen with ileus or nonspecific enteritis/colitis. Electronically Signed   By: Corrie Mckusick D.O.   On: 12/14/2020 13:47   Result Date: 12/14/2020 CLINICAL DATA:  80 year old female with possible mesenteric ischemia EXAM: CTA ABDOMEN AND PELVIS WITHOUT AND WITH CONTRAST TECHNIQUE: Multidetector CT imaging of the abdomen and pelvis was performed using the standard protocol during bolus administration of intravenous contrast. Multiplanar reconstructed images and MIPs were obtained and reviewed to evaluate the vascular anatomy. CONTRAST:  63m OMNIPAQUE IOHEXOL 350 MG/ML SOLN COMPARISON:  Noncontrast CT comparison 12/12/2020, 12/05/2020 FINDINGS: VASCULAR Aorta: Moderate to advanced atherosclerotic changes of the distal thoracic aorta. Diameter at the hiatus measures 2.4 cm. No dissection flap identified. Infrarenal abdominal aortic aneurysm measuring 2.5 cm at the IMA takeoff. Smallest diameter of the infrarenal abdominal aorta 14 mm more proximally below the renal arteries. Moderate mixed calcified and soft plaque of the abdominal aorta without evidence of high-grade stenosis. Celiac: Atherosclerotic changes at the origin of the celiac artery. 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 There is then reconstitution of the celiac artery secondary to collateral flow with patency of the left gastric artery, splenic artery, common hepatic artery. SMA: Atherosclerotic changes at the origin of the SMA, with mixed calcified and soft plaque. Estimated 50% narrowing at the origin. Arcades are patent. Renals: - Right: Atherosclerotic changes at the origin of the right renal artery without evidence of high-grade stenosis. - Left:  Patent stent at the origin of the left renal artery. The metallic artifact limits the study for evaluation of in stent stenosis or edge stenosis. Artery is patent distal to the stent, small caliber. IMA: While the IMA is favored to be patent at the origin though likely stenotic given the degree of atherosclerotic plaque of the aorta and IMA. Left colic artery is patent as well as the proximal superior rectal artery. Right lower extremity: Moderate atherosclerotic changes of the right iliac arterial system with mixed calcified and soft plaque throughout the length of the common iliac artery and external iliac artery. No high-grade stenosis or occlusion identified. Hypogastric artery appears occluded at the origin secondary to calcified plaque. No aneurysm. Moderate atherosclerotic changes of the right common femoral artery. Proximal profunda femoris and SFA patent. Left lower extremity: Moderate atherosclerotic changes of the left iliac arterial system with mixed calcified and soft plaque through the length of the common iliac artery, external iliac artery. No high-grade stenosis or occlusion. Calcified plaque at the origin of the hypogastric artery somewhat limits the evaluation, however, hypogastric appears patent. Mild to moderate atherosclerotic changes of the common femoral artery. There is a proximal origin of the lateral circumflex femoral artery. Proximal SFA and profunda femoris are patent. Veins: Unremarkable appearance of the venous system. Review of the MIP images confirms the above findings. NON-VASCULAR Lower chest: No acute finding of the lower chest. Atelectatic changes at the left greater than right lung base/scarring. Emphysema. Hepatobiliary: Unremarkable appearance of liver. Cholecystectomy. Similar appearance of mildly dilated bile ducts in the hilum of the liver, unchanged from the noncontrast CT studies. No radiopaque stones. No inflammatory changes at the head of the pancreas. Pancreas:  Unremarkable Spleen: Unremarkable Adrenals/Urinary Tract: - Right adrenal gland: Unremarkable - Left adrenal gland: Unremarkable. - Right kidney: No hydronephrosis. Punctate calcifications in the hilum of the kidney, either nonobstructive stones or vascular calcifications. Unremarkable right ureter. Multiple subcentimeter low-density rounded lesions, potentially cysts. - Left Kidney: No hydronephrosis. Vascular calcifications in the hilum of the kidney. Unremarkable left ureter. Rounded lesion on the lateral cortex of the left kidney and rounded lesion on the inferior cortex of the left kidney measuring less than 2 cm each. Both of these appear to be complex cysts given their lack of dynamic enhancement. - Urinary Bladder: Urinary bladder is decompressed. Stomach/Bowel: - Stomach: Unremarkable. - Small bowel: Proximal small bowel decompressed. Distal small bowel demonstrates borderline dilation with fluid. No transition point identified. The length of small bowel demonstrates transmural enhancement, with no segment of small bowel demonstrating differential hypoattenuation/lack of enhancement. Terminal ileum is partially fecalized, without distension. - Appendix: Normal appendix. - Colon: The majority of colon demonstrates relative enhancement similar to that of the small bowel, with the appearance of transmural enhancement maintained throughout its length. There are no segments of bowel demonstrating focal wall thickening/edema. Surgical changes of a colonic anastomosis, rectosigmoid junction. Lymphatic: No adenopathy. Mesenteric: Peritoneal dialysis catheter terminates within the low abdomen/pelvis. Small volume of dialysate. Reproductive: Small volume fluid/tissue within the endometrial canal, unusual for patient of this age. Other: Surgical changes along the midline.  No hernia. Musculoskeletal: No acute displaced fracture. No bony canal narrowing. Degenerative changes of the spine. No aggressive sclerotic  lesions. Lucent lesion on the right aspect of the L2 vertebral body present in 2009 and unchanged. 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 Reconstitution of the vessel from collateral flow. -estimated 50% narrowing of the SMA  origin secondary to calcified and soft plaque. Distal vessels patent. -at least high-grade stenosis of the IMA origin secondary to atherosclerotic plaque. Left colic artery and the proximal superior rectal artery opacify. These findings support the possibility of at least chronic mesenteric ischemia. The length of small bowel and colon demonstrate transmural enhancement, with no evidence of ischemic bowel. Dilated loops of distal small bowel without evidence of bowel obstruction/transition. This may be seen with ileus or nonspecific enteritis. 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 Stent appears patent with the metallic artifact limiting evaluation for any in-stent stenosis/edge stenosis. **An incidental finding of potential clinical significance has been found. Ill-defined soft tissue/fluid within the endometrial canal, which is unexpected in a patient of this age. Referral for gynecologic follow-up recommended, did determine the need for any biopsy and/or MRI as uterine malignancy not excluded** . Additional ancillary findings as above. Signed, Dulcy Fanny. Dellia Nims, [facility] Electronically Signed: By: Corrie Mckusick D.O. On: 12/14/2020 13:26    Scheduled Meds: . aspirin  81 mg Oral Daily  . Chlorhexidine Gluconate Cloth  6 each Topical Daily  . escitalopram  10 mg Oral Daily  . fluticasone furoate-vilanterol  1 puff Inhalation Daily   And  . umeclidinium bromide  1 puff Inhalation Daily  . gentamicin cream  1 application Topical Daily  . heparin  5,000 Units Subcutaneous Q8H  . insulin aspart  0-5 Units  Subcutaneous QHS  . insulin aspart  0-6 Units Subcutaneous TID WC  . mirtazapine  7.5 mg Oral QHS  . pantoprazole (PROTONIX) IV  40 mg Intravenous Q24H  . potassium chloride  20 mEq Oral Daily  . vancomycin (VANCOCIN) rectal ENEMA  500 mg Rectal Q6H   Continuous Infusions: . sodium chloride Stopped (12/13/20 1220)  . dialysis solution 1.5% low-MG/low-CA    . lactated ringers 75 mL/hr at 12/15/20 0736  . norepinephrine (LEVOPHED) Adult infusion    . piperacillin-tazobactam (ZOSYN)  IV 2.25 g (12/15/20 0532)  . potassium chloride    . sodium chloride      Assessment/Plan:  1. Hypotension and acute metabolic encephalopathy.  Likely from infection.  Transfer to the ICU for IV Levophed.  Gave IV fluid bolus confirmed the patient is full CODE STATUS with 3 family members at the bedside and 2 family members on the phone.  We will get critical care consultation.  Antihypertensive medications on hold. 2. Acute colitis with leukocytosis and lactic acidosis.  Since the patient does have a peritoneal dialysis could be infection in the peritoneum that is partially treated.  Usually the patient was started on empiric Flagyl and Rocephin and given a dose of vancomycin in the emergency room.  Yesterday the patient's abdominal pain was worse and still bad today.  The patient was switched to Zosyn yesterday and put empirically on p.o. vancomycin as per general surgery.  I will add back vancomycin IV today.  Blood cultures negative.  CT angiogram of the abdomen pelvis showed an ileus versus enteritis with no evidence of bowel ischemia but did see stenosis of the celiac artery and some stenosis of SMA and IMA.  Seen by vascular surgery and general surgery.  So far only adenovirus growing out of stool culture.  Patient has lactic acidosis.  We will also consult ID. 3. Leukemoid reaction with elevated white blood cell count.  White blood cell count went as high as 52.3 but now down to 29.4.  Patient has vacuolated  neutrophils  which goes along with infection. 4. Hypokalemia we will give him a few potassium runs today 5. Type 2 diabetes mellitus end-stage renal disease on peritoneal dialysis as per nephrology.  On sliding scale only with being n.p.o. 6. Hyponatremia and hypomagnesemia.  Peritoneal dialysis to help out with electrolytes 7. COPD on Trelegy inhaler if able to take 8. Hyperlipidemia unspecified on Lipitor if able to take 9. Anxiety depression on Lexapro if able to take  With help with the transitional care team Isaias Cowman we did speak with the [employer] about the patient's daughter Precious Chokshi that is stationed in [place] [id number] to get permission to fly back to [place] to visit with her mom.      Code Status:     Code Status Orders  (From admission, onward)         Start     Ordered   12/12/20 1739  Full code  Continuous        12/12/20 1739        Code Status History    Date Active Date Inactive Code Status Order ID Comments User Context   12/05/2020 1411 12/08/2020 1612 Full Code [id number]  Ivor Costa, MD Inpatient   12/11/2019 1227 12/14/2019 1844 Full Code [id number]  Ivor Costa, MD ED   07/27/2019 2253 07/30/2019 1856 Full Code [id number]  Epifanio Lesches, MD Inpatient   Advance Care Planning Activity     Family Communication: Spoke with 3 family members at the bedside and 2 family members on the phone Disposition Plan: Status is: Inpatient  Dispo: The patient is from: Home              Anticipated d/c is to: To be determined              Anticipated d/c date is: Likely will need another 5 days here in the hospital at least              Patient currently being transferred to the ICU for pressors to maintain blood pressure.   Difficult to place patient.  Hopefully not.  Consultants:  Critical care specialist  Nephrology  General surgery  Vascular surgery  Antibiotics:  Vancomycin IV pharmacy dosing  Zosyn  Oral  vancomycin  Time spent: 28 minutes, case discussed with critical care specialist and ID.  [place]  [facility] [employer]

## 2020-12-15 NOTE — Op Note (Signed)
[facility] Percutaneous Study/Intervention Procedural Note   Date: 12/15/2020  Surgeon(s): Leotis Pain, MD  Assistants: none  Pre-operative Diagnosis: 1.  Mesenteric ischemia 2. Celiac artery occlusion, SMA stenosis 3. Severe colitis   Post-operative diagnosis: Same  Procedure(s) Performed: 1. Ultrasound guidance for vascular access right femoral artery  2. Catheter placement into celiac artery and superior mesenteric artery from right femoral approach 3. Aortogram and selective angiogram of the celiac artery and superior mesenteric artery 4. Stent to the superior mesenteric artery with 6 mm diameter x 38 mm length balloon expandable stent 5. StarClose closure device right femoral artery  Contrast: 65 cc  Fluoro time: 10.1 minutes  EBL: 5 cc  Anesthesia: Approximately 45 minutes of Moderate conscious sedation using 0.5 mg of Versed   Indications: Patient is a 80 y.o. female who has symptoms consistent with mesenteric ischemia. The patient has a CT scan showing apparent celiac artery occlusion and at least moderate stenosis of the superior mesenteric artery. The patient is very sick with a white count of over 40,000 and severe colitis. The patient is brought in for angiography for further evaluation and potential treatment. Risks and benefits are discussed and informed consent is obtained  Procedure: The patient was identified and appropriate procedural time out was performed. The patient was then placed supine on the table and prepped and draped in the usual sterile fashion.Moderate conscious sedation was administered during a face to face encounter with the patient throughout the procedure with my supervision of the RN administering medicines and monitoring the patient's vital signs, pulse oximetry, telemetry and mental status throughout from the start of the procedure until  the patient was taken to the recovery room. Ultrasound was used to evaluate the right common femoral artery. It was patent . A digital ultrasound image was acquired. A Seldinger needle was used to access the right common femoral artery under direct ultrasound guidance and a permanent image was performed. A 0.035 J wire was advanced without resistance and a 5Fr sheath was placed. Pigtail catheter was placed into the aorta and an AP aortogram was performed. This demonstrated flow in the renal arteries without obvious focal stenosis. The aorta was highly irregular and had a enlarged area in the distal segment. The iliac arteries were at least mild to moderately stenotic with calcific stenosis but nothing appeared particularly high-grade. We transitioned to the lateral projection to image the celiac and SMA. The lateral image demonstrated what appeared to be occlusion of the celiac artery and a calcific lesion of the origin of the SMA which was difficult to determine the degree of stenosis. The patient was given 4000 units of IV heparin.We upsized to a 6 Fr sheath.  A VS1 catheter was used and initially we cannulated the celiac artery and selective imaging did show an occlusion about 1 cm beyond its origin. I then pulled down and use the V S1 catheter selectively cannulate the superior mesenteric artery and perform imaging.The calcific lesion at the origin of the SMA was difficult to discern but appears to be in the 60 to 70% range, although the catheter did have a hard time tracking across it because of the calcific nature of the lesion. Based on her symptoms and these findings, I elected to treat the superior mesenteric artery stenosis to try to improve the patient's clinical course. I crossed the lesion without difficulty with the advantage wire but the V S1 catheter would not easily cross the calcific lesion. I then brought an  Ansell sheath up to the origin of the SMA for better support but the V S1 catheter  still had a difficult time crossing the lesion. After exchanging for a glide catheter I still could not get the sheath to track initially. I elected to predilate the lesion with a 5 mm diameter by 4 cm length angioplasty balloon inflated to 10 atm for 1 minute in the proximal SMA. After balloon angioplasty and using the balloon as a dilator to telescoped the sheath over, I was able to get the sheath across the lesion. A waist was seen even with a 5 mm balloon. I then used a 6 mm diameter x 38 mm length balloon expandable stent to perform treatment of the SMA. I inflated the balloon to 16 atm. On completion angiogram following this, less than 10% residual stenosis was identified with brisk flow through the stent. At this point, I elected to terminate the procedure. The diagnostic catheter was removed. StarClose closure device was deployed in usual fashion with excellent hemostatic result. The patient was taken to the recovery room in stable condition having tolerated the procedure well.     Findings:Occlusion of the celiac artery. The IMA was also not seen on the initial AP aortogram concerning for occlusion. The superior mesenteric artery had at least moderate stenosis that was in the 60 to 70% range.  Disposition: Patient was taken to the recovery room in stable condition having tolerated the procedure well.  Complications: None  Leotis Pain 12/15/2020 4:09 PM   This note was created with Dragon Medical transcription system. Any errors in dictation are purely unintentional.

## 2020-12-15 NOTE — Progress Notes (Signed)
Placed patient on enteric precautions per Dr. Windell Moment.

## 2020-12-15 NOTE — Progress Notes (Signed)
[facility] Inpatient Progress Note  Subjective: Events noted. Patient being transferred to ICU s/p mesenteric arteriogram. Location not coming up in Epic. Will try to locate patient. Gen and Vascular surgery notes read and are appreciated.  Objective: Vital signs in last 24 hours: Temp:  [97.4 F (36.3 C)-98.9 F (37.2 C)] 97.5 F (36.4 C) (02/21 1457) Pulse Rate:  [91-110] 102 (02/21 1535) Resp:  [15-30] 17 (02/21 1600) BP: (75-141)/(37-77) 141/74 (02/21 1600) SpO2:  [86 %-100 %] 100 % (02/21 1600) Weight:  [55 kg] 55 kg (02/21 0530) Blood pressure (!) 141/74, pulse (!) 102, temperature (!) 97.5 F (36.4 C), temperature source Axillary, resp. rate 17, height '5\' 6"'$  (1.676 m), weight 55 kg, SpO2 100 %.    Intake/Output from previous day: 02/20 0701 - 02/21 0700 In: 2428.8 [P.O.:770; I.V.:1558.8; IV Piggyback:100] Out: -   Intake/Output this shift: Total I/O In: 1440.1 [I.V.:776.7; IV Piggyback:663.3] Out: -     Lab Results: Results for orders placed or performed during the hospital encounter of 12/12/20 (from the past 24 hour(s))  Glucose, capillary     Status: Abnormal   Collection Time: 12/14/20  5:10 PM  Result Value Ref Range   Glucose-Capillary 124 (H) 70 - 99 mg/dL  Glucose, capillary     Status: Abnormal   Collection Time: 12/14/20  9:59 PM  Result Value Ref Range   Glucose-Capillary 191 (H) 70 - 99 mg/dL  Glucose, capillary     Status: Abnormal   Collection Time: 12/15/20  7:23 AM  Result Value Ref Range   Glucose-Capillary 167 (H) 70 - 99 mg/dL  CBC with Differential/Platelet     Status: Abnormal   Collection Time: 12/15/20  7:35 AM  Result Value Ref Range   WBC 29.4 (H) 4.0 - 10.5 K/uL   RBC 3.46 (L) 3.87 - 5.11 MIL/uL   Hemoglobin 10.3 (L) 12.0 - 15.0 g/dL   HCT 31.9 (L) 36.0 - 46.0 %   MCV 92.2 80.0 - 100.0 fL   MCH 29.8 26.0 - 34.0 pg   MCHC 32.3 30.0 - 36.0 g/dL   RDW 19.9 (H) 11.5 - 15.5 %   Platelets 145 (L) 150 - 400 K/uL    nRBC 0.2 0.0 - 0.2 %   Neutrophils Relative % 91 %   Neutro Abs 26.9 (H) 1.7 - 7.7 K/uL   Lymphocytes Relative 2 %   Lymphs Abs 0.6 (L) 0.7 - 4.0 K/uL   Monocytes Relative 6 %   Monocytes Absolute 1.7 (H) 0.1 - 1.0 K/uL   Eosinophils Relative 0 %   Eosinophils Absolute 0.0 0.0 - 0.5 K/uL   Basophils Relative 0 %   Basophils Absolute 0.1 0.0 - 0.1 K/uL   WBC Morphology VACUOLATED NEUTROPHILS    RBC Morphology MORPHOLOGY UNREMARKABLE    Smear Review Normal platelet morphology    Immature Granulocytes 1 %   Abs Immature Granulocytes 0.26 (H) 0.00 - 0.07 K/uL  Basic metabolic panel     Status: Abnormal   Collection Time: 12/15/20  7:35 AM  Result Value Ref Range   Sodium 129 (L) 135 - 145 mmol/L   Potassium 2.9 (L) 3.5 - 5.1 mmol/L   Chloride 95 (L) 98 - 111 mmol/L   CO2 21 (L) 22 - 32 mmol/L   Glucose, Bld 211 (H) 70 - 99 mg/dL   BUN 38 (H) 8 - 23 mg/dL   Creatinine, Ser 7.03 (H) 0.44 - 1.00 mg/dL   Calcium 7.3 (L) 8.9 - 10.3  mg/dL   GFR, Estimated 5 (L) >60 mL/min   Anion gap 13 5 - 15  Lipase, blood     Status: Abnormal   Collection Time: 12/15/20  7:35 AM  Result Value Ref Range   Lipase 86 (H) 11 - 51 U/L  Glucose, capillary     Status: Abnormal   Collection Time: 12/15/20 11:48 AM  Result Value Ref Range   Glucose-Capillary 150 (H) 70 - 99 mg/dL  Lactic acid, plasma     Status: Abnormal   Collection Time: 12/15/20 12:24 PM  Result Value Ref Range   Lactic Acid, Venous 2.1 (HH) 0.5 - 1.9 mmol/L  MRSA PCR Screening     Status: None   Collection Time: 12/15/20  1:35 PM   Specimen: Nasal Mucosa; Nasopharyngeal  Result Value Ref Range   MRSA by PCR NEGATIVE NEGATIVE  Glucose, capillary     Status: Abnormal   Collection Time: 12/15/20  2:34 PM  Result Value Ref Range   Glucose-Capillary 107 (H) 70 - 99 mg/dL     Recent Labs    12/13/20 0428 12/14/20 0458 12/15/20 0735  WBC 52.3* 38.6* 29.4*  HGB 12.1 11.1* 10.3*  HCT 39.1 34.1* 31.9*  PLT 206 183 145*    BMET Recent Labs    12/13/20 0427 12/14/20 0458 12/15/20 0735  NA 130* 129* 129*  K 3.2* 3.0* 2.9*  CL 93* 93* 95*  CO2 22 20* 21*  GLUCOSE 216* 212* 211*  BUN 34* 37* 38*  CREATININE 7.95* 7.57* 7.03*  CALCIUM 7.1* 7.9* 7.3*   LFT Recent Labs    12/13/20 0427  PROT 4.9*  ALBUMIN 1.6*  AST 24  ALT 17  ALKPHOS 107  BILITOT 0.5   PT/INR No results for input(s): LABPROT, INR in the last 72 hours. Hepatitis Panel No results for input(s): HEPBSAG, HCVAB, HEPAIGM, HEPBIGM in the last 72 hours. C-Diff Recent Labs    12/13/20 0940  CDIFFTOX NEGATIVE   Recent Labs    12/13/20 0940  CDIFFPCR NEGATIVE     Studies/Results: CT Angio Abd/Pel w/ and/or w/o  Addendum Date: 12/14/2020   ADDENDUM REPORT: 12/14/2020 13:47 ADDENDUM: Addendum created after discussing results with surgical team, Dr. Peyton Najjar. Dilated loops of distal small bowel without evidence of bowel obstruction/transition. In addition, there is redemonstration of mild wall thickening of transverse colon, without distension or abnormal enhancement. Either of these findings may be seen with ileus or nonspecific enteritis/colitis. Electronically Signed   By: Corrie Mckusick D.O.   On: 12/14/2020 13:47   Result Date: 12/14/2020 CLINICAL DATA:  80 year old female with possible mesenteric ischemia EXAM: CTA ABDOMEN AND PELVIS WITHOUT AND WITH CONTRAST TECHNIQUE: Multidetector CT imaging of the abdomen and pelvis was performed using the standard protocol during bolus administration of intravenous contrast. Multiplanar reconstructed images and MIPs were obtained and reviewed to evaluate the vascular anatomy. CONTRAST:  77m OMNIPAQUE IOHEXOL 350 MG/ML SOLN COMPARISON:  Noncontrast CT comparison 12/12/2020, 12/05/2020 FINDINGS: VASCULAR Aorta: Moderate to advanced atherosclerotic changes of the distal thoracic aorta. Diameter at the hiatus measures 2.4 cm. No dissection flap identified. Infrarenal abdominal aortic aneurysm  measuring 2.5 cm at the IMA takeoff. Smallest diameter of the infrarenal abdominal aorta 14 mm more proximally below the renal arteries. Moderate mixed calcified and soft plaque of the abdominal aorta without evidence of high-grade stenosis. Celiac: Atherosclerotic changes at the origin of the celiac artery. 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  There is then reconstitution of the celiac artery secondary to collateral flow with patency of the left gastric artery, splenic artery, common hepatic artery. SMA: Atherosclerotic changes at the origin of the SMA, with mixed calcified and soft plaque. Estimated 50% narrowing at the origin. Arcades are patent. Renals: - Right: Atherosclerotic changes at the origin of the right renal artery without evidence of high-grade stenosis. - Left: Patent stent at the origin of the left renal artery. The metallic artifact limits the study for evaluation of in stent stenosis or edge stenosis. Artery is patent distal to the stent, small caliber. IMA: While the IMA is favored to be patent at the origin though likely stenotic given the degree of atherosclerotic plaque of the aorta and IMA. Left colic artery is patent as well as the proximal superior rectal artery. Right lower extremity: Moderate atherosclerotic changes of the right iliac arterial system with mixed calcified and soft plaque throughout the length of the common iliac artery and external iliac artery. No high-grade stenosis or occlusion identified. Hypogastric artery appears occluded at the origin secondary to calcified plaque. No aneurysm. Moderate atherosclerotic changes of the right common femoral artery. Proximal profunda femoris and SFA patent. Left lower extremity: Moderate atherosclerotic changes of the left iliac arterial system with mixed calcified and soft plaque through the length of the common iliac artery, external iliac artery. No high-grade stenosis or  occlusion. Calcified plaque at the origin of the hypogastric artery somewhat limits the evaluation, however, hypogastric appears patent. Mild to moderate atherosclerotic changes of the common femoral artery. There is a proximal origin of the lateral circumflex femoral artery. Proximal SFA and profunda femoris are patent. Veins: Unremarkable appearance of the venous system. Review of the MIP images confirms the above findings. NON-VASCULAR Lower chest: No acute finding of the lower chest. Atelectatic changes at the left greater than right lung base/scarring. Emphysema. Hepatobiliary: Unremarkable appearance of liver. Cholecystectomy. Similar appearance of mildly dilated bile ducts in the hilum of the liver, unchanged from the noncontrast CT studies. No radiopaque stones. No inflammatory changes at the head of the pancreas. Pancreas: Unremarkable Spleen: Unremarkable Adrenals/Urinary Tract: - Right adrenal gland: Unremarkable - Left adrenal gland: Unremarkable. - Right kidney: No hydronephrosis. Punctate calcifications in the hilum of the kidney, either nonobstructive stones or vascular calcifications. Unremarkable right ureter. Multiple subcentimeter low-density rounded lesions, potentially cysts. - Left Kidney: No hydronephrosis. Vascular calcifications in the hilum of the kidney. Unremarkable left ureter. Rounded lesion on the lateral cortex of the left kidney and rounded lesion on the inferior cortex of the left kidney measuring less than 2 cm each. Both of these appear to be complex cysts given their lack of dynamic enhancement. - Urinary Bladder: Urinary bladder is decompressed. Stomach/Bowel: - Stomach: Unremarkable. - Small bowel: Proximal small bowel decompressed. Distal small bowel demonstrates borderline dilation with fluid. No transition point identified. The length of small bowel demonstrates transmural enhancement, with no segment of small bowel demonstrating differential hypoattenuation/lack of  enhancement. Terminal ileum is partially fecalized, without distension. - Appendix: Normal appendix. - Colon: The majority of colon demonstrates relative enhancement similar to that of the small bowel, with the appearance of transmural enhancement maintained throughout its length. There are no segments of bowel demonstrating focal wall thickening/edema. Surgical changes of a colonic anastomosis, rectosigmoid junction. Lymphatic: No adenopathy. Mesenteric: Peritoneal dialysis catheter terminates within the low abdomen/pelvis. Small volume of dialysate. Reproductive: Small volume fluid/tissue within the endometrial canal, unusual for patient of this age. Other: Surgical changes along  the midline.  No hernia. Musculoskeletal: No acute displaced fracture. No bony canal narrowing. Degenerative changes of the spine. No aggressive sclerotic lesions. Lucent lesion on the right aspect of the L2 vertebral body present in 2009 and unchanged. 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 Reconstitution of the vessel from collateral flow. -estimated 50% narrowing of the SMA origin secondary to calcified and soft plaque. Distal vessels patent. -at least high-grade stenosis of the IMA origin secondary to atherosclerotic plaque. Left colic artery and the proximal superior rectal artery opacify. These findings support the possibility of at least chronic mesenteric ischemia. The length of small bowel and colon demonstrate transmural enhancement, with no evidence of ischemic bowel. Dilated loops of distal small bowel without evidence of bowel obstruction/transition. This may be seen with ileus or nonspecific enteritis. 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 Stent appears patent with the metallic artifact limiting evaluation for any in-stent stenosis/edge stenosis. **An incidental finding of potential clinical significance has been  found. Ill-defined soft tissue/fluid within the endometrial canal, which is unexpected in a patient of this age. Referral for gynecologic follow-up recommended, did determine the need for any biopsy and/or MRI as uterine malignancy not excluded** . Additional ancillary findings as above. Signed, Dulcy Fanny. Dellia Nims, [facility] Electronically Signed: By: Corrie Mckusick D.O. On: 12/14/2020 13:26    Scheduled Inpatient Medications:   . [MAR Hold] aspirin  81 mg Oral Daily  . [MAR Hold] Chlorhexidine Gluconate Cloth  6 each Topical Daily  . [MAR Hold] escitalopram  10 mg Oral Daily  . fentaNYL      . [MAR Hold] fluticasone furoate-vilanterol  1 puff Inhalation Daily   And  . [MAR Hold] umeclidinium bromide  1 puff Inhalation Daily  . [MAR Hold] gentamicin cream  1 application Topical Daily  . [MAR Hold] heparin  5,000 Units Subcutaneous Q8H  . heparin sodium (porcine)      . [MAR Hold] insulin aspart  0-5 Units Subcutaneous QHS  . [MAR Hold] insulin aspart  0-6 Units Subcutaneous TID WC  . midazolam      . [MAR Hold] mirtazapine  7.5 mg Oral QHS  . [MAR Hold] pantoprazole (PROTONIX) IV  40 mg Intravenous Q24H  . [MAR Hold] potassium chloride  20 mEq Oral Daily  . [MAR Hold] vancomycin  125 mg Oral Q6H    Continuous Inpatient Infusions:   . [MAR Hold] sodium chloride Stopped (12/13/20 1220)  . sodium chloride 250 mL (12/15/20 1457)  . [MAR Hold] dialysis solution 1.5% low-MG/low-CA    . lactated ringers 75 mL/hr at 12/15/20 1400  . [MAR Hold] norepinephrine (LEVOPHED) Adult infusion    . [MAR Hold] piperacillin-tazobactam (ZOSYN)  IV Stopped (12/15/20 0602)    PRN Inpatient Medications:  [MAR Hold] sodium chloride, [MAR Hold] acetaminophen **OR** [MAR Hold] acetaminophen, [MAR Hold] albuterol, [MAR Hold] heparin, [MAR Hold]  morphine injection, [MAR Hold] ondansetron **OR** [MAR Hold] ondansetron (ZOFRAN) IV, [MAR Hold]  oxyCODONE-acetaminophen    Assessment:  1. Possible ischemic colitis - Await Mesenteric arteriogram results. Agree that C dif is still a possibility despite negative stool studies for active toxin production recently. 2. Hypotension possibly related to infectious colitis. Patient being transferred to stepdown unit.  Plan:  1. Review response to treatment. 2. Consider flex sig when clinically feasible. 3. Following  Benay Pike. Alice Reichert, M.D. 12/15/2020, 4:10 PM

## 2020-12-15 NOTE — Progress Notes (Signed)
   12/14/20 2342  Assess: MEWS Score  Temp 98.7 F (37.1 C)  BP (!) 78/37  Pulse Rate (!) 110  Resp 17  Level of Consciousness Responds to Voice  SpO2 100 %  O2 Device Room Air  Assess: MEWS Score  MEWS Temp 0  MEWS Systolic 2  MEWS Pulse 1  MEWS RR 0  MEWS LOC 1  MEWS Score 4  MEWS Score Color Red  Assess: if the MEWS score is Yellow or Red  Were vital signs taken at a resting state? Yes  Focused Assessment No change from prior assessment  Early Detection of Sepsis Score *See Row Information* High  MEWS guidelines implemented *See Row Information* Yes  Treat  MEWS Interventions Other (Comment) (monitor pt on PD)  Pain Scale 0-10  Pain Score 0  Notify: Charge Nurse/RN  Name of Charge Nurse/RN Notified TKing RN  Date Charge Nurse/RN Notified 12/15/20  Time Charge Nurse/RN Notified 0037  Notify: Provider  Provider Name/Title Jonny Ruiz  Date Provider Notified 12/14/20  Time Provider Notified 2340  Notification Type Page (chat)  Notification Reason Change in status (MEWS)  Provider response No new orders (pt on PD)  Date of Provider Response 12/14/20  Time of Provider Response 2350

## 2020-12-15 NOTE — Progress Notes (Signed)
[place], [place] 12/15/20  Subjective:   Anita Wells is a 80 y.o. African American female with PMHX of anermia, barett's esophagus, carotid stenosis, CKD, GERD, HTN and Hyperlipidemia. She presents to the ED with abdominal pain. She had a recent hospitalization for colitis. She was prescribed antibiotics and discharged. She continues to have nausea, vomiting and diarrhea. No blood reported. CT scan shows colitis.  Patient can open her eyes but unable to answer questions Audible moans   Objective:  Vital signs in last 24 hours:  Temp:  [97.4 F (36.3 C)-98.9 F (37.2 C)] 98.9 F (37.2 C) (02/21 1200) Pulse Rate:  [91-110] 109 (02/21 1415) Resp:  [16-30] 27 (02/21 1415) BP: (75-115)/(37-67) 107/57 (02/21 1415) SpO2:  [92 %-100 %] 96 % (02/21 1415) Weight:  [55 kg] 55 kg (02/21 0530)  Weight change:  Filed Weights   12/12/20 1220 12/15/20 0530  Weight: 54.4 kg 55 kg    Intake/Output:    Intake/Output Summary (Last 24 hours) at 12/15/2020 1422 Last data filed at 12/15/2020 1400 Gross per 24 hour  Intake 3098.81 ml  Output -  Net 3098.81 ml     Physical Exam: General: NAD, ill appearing  HEENT Normocephalic, dry oral mucosa and lips  Pulm/lungs Clear bilaterally  CVS/Heart S1-S2 present  Abdomen:  soft, left lower quadrant tenderness  Extremities: Minimal peripheral edema  Neurologic: Eyes open, moaning only  Skin: Dry, no lesions  Access: PD catheter       Basic Metabolic Panel:  Recent Labs  Lab 12/12/20 1350 12/13/20 0427 12/14/20 0458 12/15/20 0735  NA 130* 130* 129* 129*  K 3.4* 3.2* 3.0* 2.9*  CL 89* 93* 93* 95*  CO2 20* 22 20* 21*  GLUCOSE 136* 216* 212* 211*  BUN 33* 34* 37* 38*  CREATININE 9.12* 7.95* 7.57* 7.03*  CALCIUM 7.7* 7.1* 7.9* 7.3*  MG 1.6*  --   --   --      CBC: Recent Labs  Lab 12/12/20 1241 12/13/20 0427 12/13/20 0428 12/14/20 0458 12/15/20 0735  WBC 29.8* 52.3* 52.3* 38.6* 29.4*   NEUTROABS  --   --  48.8*  --  26.9*  HGB 14.4 12.1 12.1 11.1* 10.3*  HCT 46.0 39.1 39.1 34.1* 31.9*  MCV 94.8 95.1 95.6 92.2 92.2  PLT 293 206 206 183 145*     No results found for: HEPBSAG, HEPBSAB, HEPBIGM    Microbiology:  Recent Results (from the past 240 hour(s))  Gastrointestinal Panel by PCR , Stool     Status: None   Collection Time: 12/05/20  4:40 PM   Specimen: Stool  Result Value Ref Range Status   Campylobacter species NOT DETECTED NOT DETECTED Final   Plesimonas shigelloides NOT DETECTED NOT DETECTED Final   Salmonella species NOT DETECTED NOT DETECTED Final   Yersinia enterocolitica NOT DETECTED NOT DETECTED Final   Vibrio species NOT DETECTED NOT DETECTED Final   Vibrio cholerae NOT DETECTED NOT DETECTED Final   Enteroaggregative E coli (EAEC) NOT DETECTED NOT DETECTED Final   Enteropathogenic E coli (EPEC) NOT DETECTED NOT DETECTED Final   Enterotoxigenic E coli (ETEC) NOT DETECTED NOT DETECTED Final   Shiga like toxin producing E coli (STEC) NOT DETECTED NOT DETECTED Final   Shigella/Enteroinvasive E coli (EIEC) NOT DETECTED NOT DETECTED Final   Cryptosporidium NOT DETECTED NOT DETECTED Final   Cyclospora cayetanensis NOT DETECTED NOT DETECTED Final   Entamoeba histolytica NOT DETECTED NOT DETECTED Final   Giardia lamblia NOT DETECTED NOT  DETECTED Final   Adenovirus F40/41 NOT DETECTED NOT DETECTED Final   Astrovirus NOT DETECTED NOT DETECTED Final   Norovirus GI/GII NOT DETECTED NOT DETECTED Final   Rotavirus A NOT DETECTED NOT DETECTED Final   Sapovirus (I, II, IV, and V) NOT DETECTED NOT DETECTED Final    Comment: Performed at [facility], [address] [place], [place] [address]  Culture, blood (routine x 2)     Status: None (Preliminary result)   Collection Time: 12/12/20  1:50 PM   Specimen: BLOOD  Result Value Ref Range Status   Specimen Description BLOOD BLOOD RIGHT ARM  Final   Special Requests   Final    BOTTLES DRAWN AEROBIC AND  ANAEROBIC Blood Culture adequate volume   Culture   Final    NO GROWTH 3 DAYS Performed at [facility], [place], [place] [address]    Report Status PENDING  Incomplete  SARS CORONAVIRUS 2 (TAT 6-24 HRS) Nasopharyngeal Nasopharyngeal Swab     Status: None   Collection Time: 12/12/20  2:30 PM   Specimen: Nasopharyngeal Swab  Result Value Ref Range Status   SARS Coronavirus 2 NEGATIVE NEGATIVE Final    Comment: (NOTE) SARS-CoV-2 target nucleic acids are NOT DETECTED.  The SARS-CoV-2 RNA is generally detectable in upper and lower respiratory specimens during the acute phase of infection. Negative results do not preclude SARS-CoV-2 infection, do not rule out co-infections with other pathogens, and should not be used as the sole basis for treatment or other patient management decisions. Negative results must be combined with clinical observations, patient history, and epidemiological information. The expected result is Negative.  Fact Sheet for Patients: SugarRoll.be  Fact Sheet for Healthcare Providers: https://www.woods-mathews.com/  This test is not yet approved or cleared by the [place] FDA and  has been authorized for detection and/or diagnosis of SARS-CoV-2 by FDA under an Emergency Use Authorization (EUA). This EUA will remain  in effect (meaning this test can be used) for the duration of the COVID-19 declaration under Se ction 564(b)(1) of the Act, 21 U.S.C. section 360bbb-3(b)(1), unless the authorization is terminated or revoked sooner.  Performed at [facility], [place] [address], [place], [place] [address]   Culture, blood (routine x 2)     Status: None (Preliminary result)   Collection Time: 12/12/20  2:56 PM   Specimen: BLOOD  Result Value Ref Range Status   Specimen Description BLOOD BLOOD RIGHT FOREARM  Final   Special Requests   Final    BOTTLES DRAWN AEROBIC AND ANAEROBIC Blood  Culture adequate volume   Culture   Final    NO GROWTH 3 DAYS Performed at [facility], [address] [place], [place] [address]    Report Status PENDING  Incomplete  Gastrointestinal Panel by PCR , Stool     Status: Abnormal   Collection Time: 12/13/20  9:40 AM   Specimen: Stool  Result Value Ref Range Status   Campylobacter species NOT DETECTED NOT DETECTED Final   Plesimonas shigelloides NOT DETECTED NOT DETECTED Final   Salmonella species NOT DETECTED NOT DETECTED Final   Yersinia enterocolitica NOT DETECTED NOT DETECTED Final   Vibrio species NOT DETECTED NOT DETECTED Final   Vibrio cholerae NOT DETECTED NOT DETECTED Final   Enteroaggregative E coli (EAEC) NOT DETECTED NOT DETECTED Final   Enteropathogenic E coli (EPEC) NOT DETECTED NOT DETECTED Final   Enterotoxigenic E coli (ETEC) NOT DETECTED NOT DETECTED Final   Shiga  like toxin producing E coli (STEC) NOT DETECTED NOT DETECTED Final   Shigella/Enteroinvasive E coli (EIEC) NOT DETECTED NOT DETECTED Final   Cryptosporidium NOT DETECTED NOT DETECTED Final   Cyclospora cayetanensis NOT DETECTED NOT DETECTED Final   Entamoeba histolytica NOT DETECTED NOT DETECTED Final   Giardia lamblia NOT DETECTED NOT DETECTED Final   Adenovirus F40/41 DETECTED (A) NOT DETECTED Final   Astrovirus NOT DETECTED NOT DETECTED Final   Norovirus GI/GII NOT DETECTED NOT DETECTED Final   Rotavirus A NOT DETECTED NOT DETECTED Final   Sapovirus (I, II, IV, and V) NOT DETECTED NOT DETECTED Final    Comment: Performed at [facility], [address] [place], [place] [address]  C Difficile Quick Screen (NO PCR Reflex)     Status: Abnormal   Collection Time: 12/13/20  9:40 AM   Specimen: STOOL  Result Value Ref Range Status   C Diff antigen POSITIVE (A) NEGATIVE Final   C Diff toxin NEGATIVE NEGATIVE Final   C Diff interpretation   Final    Results are indeterminate. Please contact the provider listed for your campus for C diff  questions in [place].    Comment: Performed at [facility], [address] [place], [place] [address]  C. Diff by PCR, Reflexed     Status: None   Collection Time: 12/13/20  9:40 AM  Result Value Ref Range Status   Toxigenic C. Difficile by PCR NEGATIVE NEGATIVE Final    Comment: Patient is colonized with non toxigenic C. difficile. May not need treatment unless significant symptoms are present. Performed at [facility], [address] [place],  [address]     Coagulation Studies: No results for input(s): LABPROT, INR in the last 72 hours.  Urinalysis: No results for input(s): COLORURINE, LABSPEC, PHURINE, GLUCOSEU, HGBUR, BILIRUBINUR, KETONESUR, PROTEINUR, UROBILINOGEN, NITRITE, LEUKOCYTESUR in the last 72 hours.  Invalid input(s): APPERANCEUR    Imaging: CT Angio Abd/Pel w/ and/or w/o  Addendum Date: 12/14/2020   ADDENDUM REPORT: 12/14/2020 13:47 ADDENDUM: Addendum created after discussing results with surgical team, Dr. Peyton Najjar. Dilated loops of distal small bowel without evidence of bowel obstruction/transition. In addition, there is redemonstration of mild wall thickening of transverse colon, without distension or abnormal enhancement. Either of these findings may be seen with ileus or nonspecific enteritis/colitis. Electronically Signed   By: Corrie Mckusick D.O.   On: 12/14/2020 13:47   Result Date: 12/14/2020 CLINICAL DATA:  80 year old female with possible mesenteric ischemia EXAM: CTA ABDOMEN AND PELVIS WITHOUT AND WITH CONTRAST TECHNIQUE: Multidetector CT imaging of the abdomen and pelvis was performed using the standard protocol during bolus administration of intravenous contrast. Multiplanar reconstructed images and MIPs were obtained and reviewed to evaluate the vascular anatomy. CONTRAST:  49m OMNIPAQUE IOHEXOL 350 MG/ML SOLN COMPARISON:  Noncontrast CT comparison 12/12/2020, 12/05/2020 FINDINGS: VASCULAR Aorta: Moderate to advanced atherosclerotic  changes of the distal thoracic aorta. Diameter at the hiatus measures 2.4 cm. No dissection flap identified. Infrarenal abdominal aortic aneurysm measuring 2.5 cm at the IMA takeoff. Smallest diameter of the infrarenal abdominal aorta 14 mm more proximally below the renal arteries. Moderate mixed calcified and soft plaque of the abdominal aorta without evidence of high-grade stenosis. Celiac: Atherosclerotic changes at the origin of the celiac artery. 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 There is then reconstitution of the celiac artery secondary to collateral flow with patency of the left gastric artery, splenic artery, common  hepatic artery. SMA: Atherosclerotic changes at the origin of the SMA, with mixed calcified and soft plaque. Estimated 50% narrowing at the origin. Arcades are patent. Renals: - Right: Atherosclerotic changes at the origin of the right renal artery without evidence of high-grade stenosis. - Left: Patent stent at the origin of the left renal artery. The metallic artifact limits the study for evaluation of in stent stenosis or edge stenosis. Artery is patent distal to the stent, small caliber. IMA: While the IMA is favored to be patent at the origin though likely stenotic given the degree of atherosclerotic plaque of the aorta and IMA. Left colic artery is patent as well as the proximal superior rectal artery. Right lower extremity: Moderate atherosclerotic changes of the right iliac arterial system with mixed calcified and soft plaque throughout the length of the common iliac artery and external iliac artery. No high-grade stenosis or occlusion identified. Hypogastric artery appears occluded at the origin secondary to calcified plaque. No aneurysm. Moderate atherosclerotic changes of the right common femoral artery. Proximal profunda femoris and SFA patent. Left lower extremity: Moderate atherosclerotic changes of the left iliac arterial  system with mixed calcified and soft plaque through the length of the common iliac artery, external iliac artery. No high-grade stenosis or occlusion. Calcified plaque at the origin of the hypogastric artery somewhat limits the evaluation, however, hypogastric appears patent. Mild to moderate atherosclerotic changes of the common femoral artery. There is a proximal origin of the lateral circumflex femoral artery. Proximal SFA and profunda femoris are patent. Veins: Unremarkable appearance of the venous system. Review of the MIP images confirms the above findings. NON-VASCULAR Lower chest: No acute finding of the lower chest. Atelectatic changes at the left greater than right lung base/scarring. Emphysema. Hepatobiliary: Unremarkable appearance of liver. Cholecystectomy. Similar appearance of mildly dilated bile ducts in the hilum of the liver, unchanged from the noncontrast CT studies. No radiopaque stones. No inflammatory changes at the head of the pancreas. Pancreas: Unremarkable Spleen: Unremarkable Adrenals/Urinary Tract: - Right adrenal gland: Unremarkable - Left adrenal gland: Unremarkable. - Right kidney: No hydronephrosis. Punctate calcifications in the hilum of the kidney, either nonobstructive stones or vascular calcifications. Unremarkable right ureter. Multiple subcentimeter low-density rounded lesions, potentially cysts. - Left Kidney: No hydronephrosis. Vascular calcifications in the hilum of the kidney. Unremarkable left ureter. Rounded lesion on the lateral cortex of the left kidney and rounded lesion on the inferior cortex of the left kidney measuring less than 2 cm each. Both of these appear to be complex cysts given their lack of dynamic enhancement. - Urinary Bladder: Urinary bladder is decompressed. Stomach/Bowel: - Stomach: Unremarkable. - Small bowel: Proximal small bowel decompressed. Distal small bowel demonstrates borderline dilation with fluid. No transition point identified. The length of  small bowel demonstrates transmural enhancement, with no segment of small bowel demonstrating differential hypoattenuation/lack of enhancement. Terminal ileum is partially fecalized, without distension. - Appendix: Normal appendix. - Colon: The majority of colon demonstrates relative enhancement similar to that of the small bowel, with the appearance of transmural enhancement maintained throughout its length. There are no segments of bowel demonstrating focal wall thickening/edema. Surgical changes of a colonic anastomosis, rectosigmoid junction. Lymphatic: No adenopathy. Mesenteric: Peritoneal dialysis catheter terminates within the low abdomen/pelvis. Small volume of dialysate. Reproductive: Small volume fluid/tissue within the endometrial canal, unusual for patient of this age. Other: Surgical changes along the midline.  No hernia. Musculoskeletal: No acute displaced fracture. No bony canal narrowing. Degenerative changes of the spine. No aggressive sclerotic  lesions. Lucent lesion on the right aspect of the L2 vertebral body present in 2009 and unchanged. 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 Reconstitution of the vessel from collateral flow. -estimated 50% narrowing of the SMA origin secondary to calcified and soft plaque. Distal vessels patent. -at least high-grade stenosis of the IMA origin secondary to atherosclerotic plaque. Left colic artery and the proximal superior rectal artery opacify. These findings support the possibility of at least chronic mesenteric ischemia. The length of small bowel and colon demonstrate transmural enhancement, with no evidence of ischemic bowel. Dilated loops of distal small bowel without evidence of bowel obstruction/transition. This may be seen with ileus or nonspecific enteritis. 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 Stent appears patent with the metallic  artifact limiting evaluation for any in-stent stenosis/edge stenosis. **An incidental finding of potential clinical significance has been found. Ill-defined soft tissue/fluid within the endometrial canal, which is unexpected in a patient of this age. Referral for gynecologic follow-up recommended, did determine the need for any biopsy and/or MRI as uterine malignancy not excluded** . Additional ancillary findings as above. Signed, Dulcy Fanny. Dellia Nims, [facility] Electronically Signed: By: Corrie Mckusick D.O. On: 12/14/2020 13:26     Medications:   . sodium chloride Stopped (12/13/20 1220)  . sodium chloride    . dialysis solution 1.5% low-MG/low-CA    . lactated ringers 75 mL/hr at 12/15/20 1400  . norepinephrine (LEVOPHED) Adult infusion    . piperacillin-tazobactam (ZOSYN)  IV Stopped (12/15/20 0602)  . potassium chloride 10 mEq (12/15/20 1420)  . vancomycin 166.7 mL/hr at 12/15/20 1400   . aspirin  81 mg Oral Daily  . Chlorhexidine Gluconate Cloth  6 each Topical Daily  . escitalopram  10 mg Oral Daily  . fluticasone furoate-vilanterol  1 puff Inhalation Daily   And  . umeclidinium bromide  1 puff Inhalation Daily  . gentamicin cream  1 application Topical Daily  . heparin  5,000 Units Subcutaneous Q8H  . insulin aspart  0-5 Units Subcutaneous QHS  . insulin aspart  0-6 Units Subcutaneous TID WC  . mirtazapine  7.5 mg Oral QHS  . pantoprazole (PROTONIX) IV  40 mg Intravenous Q24H  . potassium chloride  20 mEq Oral Daily  . vancomycin  125 mg Oral Q6H   sodium chloride, acetaminophen **OR** acetaminophen, albuterol, heparin, morphine injection, ondansetron **OR** ondansetron (ZOFRAN) IV, oxyCODONE-acetaminophen  Assessment/ Plan:  80 y.o. female with PMHX of anermia, barett's esophagus, carotid stenosis, CKD, GERD, HTN and Hyperlipidemia. was admitted on 12/12/2020 for Colitis [K52.9]  Active Problems:   Type 2  diabetes mellitus with ESRD (end-stage renal disease) (HCC)   Colitis   Lactic acidosis   Acute pancreatitis without infection or necrosis   Hypotension   Acute metabolic encephalopathy  Colitis [K52.9] Generalized abdominal pain [R10.84]  #. End Stage Renal Disease on Peritoneal Dialysis -CCPD 10 hours, 4 exchanges, 2000 fills -Continue home regimen PD nightly -Next treatment tonight  #. Anemia of Chronic kidney disease  Lab Results  Component Value Date   HGB 10.3 (L) 12/15/2020   -will monitor for changes  #. Secondary hyperparathyroidism of renal origin     Component Value Date/Time   PTH 181 (H) 12/12/2019 1505   Lab Results  Component Value Date   PHOS 3.1 12/12/2019  Calcium acetate ordered with meals Monitor calcium and phos level during this admission   #.  Diabetes type 2 with CKD Hgb A1c MFr Bld (%)  Date Value  12/12/2019 13.0 (H)  Glucose levels controlled   #Hypokalemia Can be replaced IV as necessary Will monitor.  #Abdominal pain Work-up is in progress Differential diagnosis includes mesenteric ischemia colitis.   PD fluid WBC count 144 with 47% neutrophils on December 12, 2020. -CT scan result-Mesenteric disease -Mesenteric angiogram planned today to evaluate disease progression -transferred to ICU for closer monitoring and BP support   Patient is at high risk of morbidity due to multiple underlying acute on chronic issues.  Consider palliative care evaluation.   LOS: [place] 2/21/20222:22 [place], [place]

## 2020-12-15 NOTE — Interval H&P Note (Signed)
History and Physical Interval Note:  12/15/2020 2:47 PM  Anita Wells  has presented today for surgery, with the diagnosis of Chronic Ischemia, colitis.  The various methods of treatment have been discussed with the patient and family. After consideration of risks, benefits and other options for treatment, the patient has consented to  Procedure(s): VISCERAL ANGIOGRAPHY (N/A) as a surgical intervention.  The patient's history has been reviewed, patient examined, no change in status, stable for surgery.  I have reviewed the patient's chart and labs.  Questions were answered to the patient's satisfaction.     Leotis Pain

## 2020-12-15 NOTE — Progress Notes (Signed)
Patient off unit to special procedures with Verneda Skill, RN.

## 2020-12-15 NOTE — H&P (View-Only) (Signed)
[facility] Gastroenterology Inpatient Progress Note  Subjective: Events noted. Patient being transferred to ICU s/p mesenteric arteriogram. Location not coming up in Epic. Will try to locate patient. Gen and Vascular surgery notes read and are appreciated.  Objective: Vital signs in last 24 hours: Temp:  [97.4 F (36.3 C)-98.9 F (37.2 C)] 97.5 F (36.4 C) (02/21 1457) Pulse Rate:  [91-110] 102 (02/21 1535) Resp:  [15-30] 17 (02/21 1600) BP: (75-141)/(37-77) 141/74 (02/21 1600) SpO2:  [86 %-100 %] 100 % (02/21 1600) Weight:  [55 kg] 55 kg (02/21 0530) Blood pressure (!) 141/74, pulse (!) 102, temperature (!) 97.5 F (36.4 C), temperature source Axillary, resp. rate 17, height '5\' 6"'$  (1.676 m), weight 55 kg, SpO2 100 %.    Intake/Output from previous day: 02/20 0701 - 02/21 0700 In: 2428.8 [P.O.:770; I.V.:1558.8; IV Piggyback:100] Out: -   Intake/Output this shift: Total I/O In: 1440.1 [I.V.:776.7; IV Piggyback:663.3] Out: -     Lab Results: Results for orders placed or performed during the hospital encounter of 12/12/20 (from the past 24 hour(s))  Glucose, capillary     Status: Abnormal   Collection Time: 12/14/20  5:10 PM  Result Value Ref Range   Glucose-Capillary 124 (H) 70 - 99 mg/dL  Glucose, capillary     Status: Abnormal   Collection Time: 12/14/20  9:59 PM  Result Value Ref Range   Glucose-Capillary 191 (H) 70 - 99 mg/dL  Glucose, capillary     Status: Abnormal   Collection Time: 12/15/20  7:23 AM  Result Value Ref Range   Glucose-Capillary 167 (H) 70 - 99 mg/dL  CBC with Differential/Platelet     Status: Abnormal   Collection Time: 12/15/20  7:35 AM  Result Value Ref Range   WBC 29.4 (H) 4.0 - 10.5 K/uL   RBC 3.46 (L) 3.87 - 5.11 MIL/uL   Hemoglobin 10.3 (L) 12.0 - 15.0 g/dL   HCT 31.9 (L) 36.0 - 46.0 %   MCV 92.2 80.0 - 100.0 fL   MCH 29.8 26.0 - 34.0 pg   MCHC 32.3 30.0 - 36.0 g/dL   RDW 19.9 (H) 11.5 - 15.5 %   Platelets 145 (L) 150 - 400 K/uL    nRBC 0.2 0.0 - 0.2 %   Neutrophils Relative % 91 %   Neutro Abs 26.9 (H) 1.7 - 7.7 K/uL   Lymphocytes Relative 2 %   Lymphs Abs 0.6 (L) 0.7 - 4.0 K/uL   Monocytes Relative 6 %   Monocytes Absolute 1.7 (H) 0.1 - 1.0 K/uL   Eosinophils Relative 0 %   Eosinophils Absolute 0.0 0.0 - 0.5 K/uL   Basophils Relative 0 %   Basophils Absolute 0.1 0.0 - 0.1 K/uL   WBC Morphology VACUOLATED NEUTROPHILS    RBC Morphology MORPHOLOGY UNREMARKABLE    Smear Review Normal platelet morphology    Immature Granulocytes 1 %   Abs Immature Granulocytes 0.26 (H) 0.00 - 0.07 K/uL  Basic metabolic panel     Status: Abnormal   Collection Time: 12/15/20  7:35 AM  Result Value Ref Range   Sodium 129 (L) 135 - 145 mmol/L   Potassium 2.9 (L) 3.5 - 5.1 mmol/L   Chloride 95 (L) 98 - 111 mmol/L   CO2 21 (L) 22 - 32 mmol/L   Glucose, Bld 211 (H) 70 - 99 mg/dL   BUN 38 (H) 8 - 23 mg/dL   Creatinine, Ser 7.03 (H) 0.44 - 1.00 mg/dL   Calcium 7.3 (L) 8.9 - 10.3  mg/dL   GFR, Estimated 5 (L) >60 mL/min   Anion gap 13 5 - 15  Lipase, blood     Status: Abnormal   Collection Time: 12/15/20  7:35 AM  Result Value Ref Range   Lipase 86 (H) 11 - 51 U/L  Glucose, capillary     Status: Abnormal   Collection Time: 12/15/20 11:48 AM  Result Value Ref Range   Glucose-Capillary 150 (H) 70 - 99 mg/dL  Lactic acid, plasma     Status: Abnormal   Collection Time: 12/15/20 12:24 PM  Result Value Ref Range   Lactic Acid, Venous 2.1 (HH) 0.5 - 1.9 mmol/L  MRSA PCR Screening     Status: None   Collection Time: 12/15/20  1:35 PM   Specimen: Nasal Mucosa; Nasopharyngeal  Result Value Ref Range   MRSA by PCR NEGATIVE NEGATIVE  Glucose, capillary     Status: Abnormal   Collection Time: 12/15/20  2:34 PM  Result Value Ref Range   Glucose-Capillary 107 (H) 70 - 99 mg/dL     Recent Labs    12/13/20 0428 12/14/20 0458 12/15/20 0735  WBC 52.3* 38.6* 29.4*  HGB 12.1 11.1* 10.3*  HCT 39.1 34.1* 31.9*  PLT 206 183 145*    BMET Recent Labs    12/13/20 0427 12/14/20 0458 12/15/20 0735  NA 130* 129* 129*  K 3.2* 3.0* 2.9*  CL 93* 93* 95*  CO2 22 20* 21*  GLUCOSE 216* 212* 211*  BUN 34* 37* 38*  CREATININE 7.95* 7.57* 7.03*  CALCIUM 7.1* 7.9* 7.3*   LFT Recent Labs    12/13/20 0427  PROT 4.9*  ALBUMIN 1.6*  AST 24  ALT 17  ALKPHOS 107  BILITOT 0.5   PT/INR No results for input(s): LABPROT, INR in the last 72 hours. Hepatitis Panel No results for input(s): HEPBSAG, HCVAB, HEPAIGM, HEPBIGM in the last 72 hours. C-Diff Recent Labs    12/13/20 0940  CDIFFTOX NEGATIVE   Recent Labs    12/13/20 0940  CDIFFPCR NEGATIVE     Studies/Results: CT Angio Abd/Pel w/ and/or w/o  Addendum Date: 12/14/2020   ADDENDUM REPORT: 12/14/2020 13:47 ADDENDUM: Addendum created after discussing results with surgical team, Dr. Peyton Najjar. Dilated loops of distal small bowel without evidence of bowel obstruction/transition. In addition, there is redemonstration of mild wall thickening of transverse colon, without distension or abnormal enhancement. Either of these findings may be seen with ileus or nonspecific enteritis/colitis. Electronically Signed   By: Corrie Mckusick D.O.   On: 12/14/2020 13:47   Result Date: 12/14/2020 CLINICAL DATA:  80 year old female with possible mesenteric ischemia EXAM: CTA ABDOMEN AND PELVIS WITHOUT AND WITH CONTRAST TECHNIQUE: Multidetector CT imaging of the abdomen and pelvis was performed using the standard protocol during bolus administration of intravenous contrast. Multiplanar reconstructed images and MIPs were obtained and reviewed to evaluate the vascular anatomy. CONTRAST:  53m OMNIPAQUE IOHEXOL 350 MG/ML SOLN COMPARISON:  Noncontrast CT comparison 12/12/2020, 12/05/2020 FINDINGS: VASCULAR Aorta: Moderate to advanced atherosclerotic changes of the distal thoracic aorta. Diameter at the hiatus measures 2.4 cm. No dissection flap identified. Infrarenal abdominal aortic aneurysm  measuring 2.5 cm at the IMA takeoff. Smallest diameter of the infrarenal abdominal aorta 14 mm more proximally below the renal arteries. Moderate mixed calcified and soft plaque of the abdominal aorta without evidence of high-grade stenosis. Celiac: Atherosclerotic changes at the origin of the celiac artery. 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  There is then reconstitution of the celiac artery secondary to collateral flow with patency of the left gastric artery, splenic artery, common hepatic artery. SMA: Atherosclerotic changes at the origin of the SMA, with mixed calcified and soft plaque. Estimated 50% narrowing at the origin. Arcades are patent. Renals: - Right: Atherosclerotic changes at the origin of the right renal artery without evidence of high-grade stenosis. - Left: Patent stent at the origin of the left renal artery. The metallic artifact limits the study for evaluation of in stent stenosis or edge stenosis. Artery is patent distal to the stent, small caliber. IMA: While the IMA is favored to be patent at the origin though likely stenotic given the degree of atherosclerotic plaque of the aorta and IMA. Left colic artery is patent as well as the proximal superior rectal artery. Right lower extremity: Moderate atherosclerotic changes of the right iliac arterial system with mixed calcified and soft plaque throughout the length of the common iliac artery and external iliac artery. No high-grade stenosis or occlusion identified. Hypogastric artery appears occluded at the origin secondary to calcified plaque. No aneurysm. Moderate atherosclerotic changes of the right common femoral artery. Proximal profunda femoris and SFA patent. Left lower extremity: Moderate atherosclerotic changes of the left iliac arterial system with mixed calcified and soft plaque through the length of the common iliac artery, external iliac artery. No high-grade stenosis or  occlusion. Calcified plaque at the origin of the hypogastric artery somewhat limits the evaluation, however, hypogastric appears patent. Mild to moderate atherosclerotic changes of the common femoral artery. There is a proximal origin of the lateral circumflex femoral artery. Proximal SFA and profunda femoris are patent. Veins: Unremarkable appearance of the venous system. Review of the MIP images confirms the above findings. NON-VASCULAR Lower chest: No acute finding of the lower chest. Atelectatic changes at the left greater than right lung base/scarring. Emphysema. Hepatobiliary: Unremarkable appearance of liver. Cholecystectomy. Similar appearance of mildly dilated bile ducts in the hilum of the liver, unchanged from the noncontrast CT studies. No radiopaque stones. No inflammatory changes at the head of the pancreas. Pancreas: Unremarkable Spleen: Unremarkable Adrenals/Urinary Tract: - Right adrenal gland: Unremarkable - Left adrenal gland: Unremarkable. - Right kidney: No hydronephrosis. Punctate calcifications in the hilum of the kidney, either nonobstructive stones or vascular calcifications. Unremarkable right ureter. Multiple subcentimeter low-density rounded lesions, potentially cysts. - Left Kidney: No hydronephrosis. Vascular calcifications in the hilum of the kidney. Unremarkable left ureter. Rounded lesion on the lateral cortex of the left kidney and rounded lesion on the inferior cortex of the left kidney measuring less than 2 cm each. Both of these appear to be complex cysts given their lack of dynamic enhancement. - Urinary Bladder: Urinary bladder is decompressed. Stomach/Bowel: - Stomach: Unremarkable. - Small bowel: Proximal small bowel decompressed. Distal small bowel demonstrates borderline dilation with fluid. No transition point identified. The length of small bowel demonstrates transmural enhancement, with no segment of small bowel demonstrating differential hypoattenuation/lack of  enhancement. Terminal ileum is partially fecalized, without distension. - Appendix: Normal appendix. - Colon: The majority of colon demonstrates relative enhancement similar to that of the small bowel, with the appearance of transmural enhancement maintained throughout its length. There are no segments of bowel demonstrating focal wall thickening/edema. Surgical changes of a colonic anastomosis, rectosigmoid junction. Lymphatic: No adenopathy. Mesenteric: Peritoneal dialysis catheter terminates within the low abdomen/pelvis. Small volume of dialysate. Reproductive: Small volume fluid/tissue within the endometrial canal, unusual for patient of this age. Other: Surgical changes along  the midline.  No hernia. Musculoskeletal: No acute displaced fracture. No bony canal narrowing. Degenerative changes of the spine. No aggressive sclerotic lesions. Lucent lesion on the right aspect of the L2 vertebral body present in 2009 and unchanged. 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 Reconstitution of the vessel from collateral flow. -estimated 50% narrowing of the SMA origin secondary to calcified and soft plaque. Distal vessels patent. -at least high-grade stenosis of the IMA origin secondary to atherosclerotic plaque. Left colic artery and the proximal superior rectal artery opacify. These findings support the possibility of at least chronic mesenteric ischemia. The length of small bowel and colon demonstrate transmural enhancement, with no evidence of ischemic bowel. Dilated loops of distal small bowel without evidence of bowel obstruction/transition. This may be seen with ileus or nonspecific enteritis. 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 Stent appears patent with the metallic artifact limiting evaluation for any in-stent stenosis/edge stenosis. **An incidental finding of potential clinical significance has been  found. Ill-defined soft tissue/fluid within the endometrial canal, which is unexpected in a patient of this age. Referral for gynecologic follow-up recommended, did determine the need for any biopsy and/or MRI as uterine malignancy not excluded** . Additional ancillary findings as above. Signed, Dulcy Fanny. Dellia Nims, [place] [facility] Electronically Signed: By: Corrie Mckusick D.O. On: 12/14/2020 13:26    Scheduled Inpatient Medications:   . [MAR Hold] aspirin  81 mg Oral Daily  . [MAR Hold] Chlorhexidine Gluconate Cloth  6 each Topical Daily  . [MAR Hold] escitalopram  10 mg Oral Daily  . fentaNYL      . [MAR Hold] fluticasone furoate-vilanterol  1 puff Inhalation Daily   And  . [MAR Hold] umeclidinium bromide  1 puff Inhalation Daily  . [MAR Hold] gentamicin cream  1 application Topical Daily  . [MAR Hold] heparin  5,000 Units Subcutaneous Q8H  . heparin sodium (porcine)      . [MAR Hold] insulin aspart  0-5 Units Subcutaneous QHS  . [MAR Hold] insulin aspart  0-6 Units Subcutaneous TID WC  . midazolam      . [MAR Hold] mirtazapine  7.5 mg Oral QHS  . [MAR Hold] pantoprazole (PROTONIX) IV  40 mg Intravenous Q24H  . [MAR Hold] potassium chloride  20 mEq Oral Daily  . [MAR Hold] vancomycin  125 mg Oral Q6H    Continuous Inpatient Infusions:   . [MAR Hold] sodium chloride Stopped (12/13/20 1220)  . sodium chloride 250 mL (12/15/20 1457)  . [MAR Hold] dialysis solution 1.5% low-MG/low-CA    . lactated ringers 75 mL/hr at 12/15/20 1400  . [MAR Hold] norepinephrine (LEVOPHED) Adult infusion    . [MAR Hold] piperacillin-tazobactam (ZOSYN)  IV Stopped (12/15/20 0602)    PRN Inpatient Medications:  [MAR Hold] sodium chloride, [MAR Hold] acetaminophen **OR** [MAR Hold] acetaminophen, [MAR Hold] albuterol, [MAR Hold] heparin, [MAR Hold]  morphine injection, [MAR Hold] ondansetron **OR** [MAR Hold] ondansetron (ZOFRAN) IV, [MAR Hold]  oxyCODONE-acetaminophen    Assessment:  1. Possible ischemic colitis - Await Mesenteric arteriogram results. Agree that C dif is still a possibility despite negative stool studies for active toxin production recently. 2. Hypotension possibly related to infectious colitis. Patient being transferred to stepdown unit.  Plan:  1. Review response to treatment. 2. Consider flex sig when clinically feasible. 3. Following  Benay Pike. Alice Reichert, M.D. 12/15/2020, 4:10 PM

## 2020-12-16 ENCOUNTER — Inpatient Hospital Stay: Payer: Medicare Other

## 2020-12-16 ENCOUNTER — Encounter: Payer: Self-pay | Admitting: Vascular Surgery

## 2020-12-16 DIAGNOSIS — I739 Peripheral vascular disease, unspecified: Secondary | ICD-10-CM

## 2020-12-16 DIAGNOSIS — K567 Ileus, unspecified: Secondary | ICD-10-CM | POA: Diagnosis not present

## 2020-12-16 DIAGNOSIS — D72823 Leukemoid reaction: Secondary | ICD-10-CM | POA: Diagnosis not present

## 2020-12-16 DIAGNOSIS — R579 Shock, unspecified: Secondary | ICD-10-CM

## 2020-12-16 DIAGNOSIS — K529 Noninfective gastroenteritis and colitis, unspecified: Secondary | ICD-10-CM

## 2020-12-16 DIAGNOSIS — E872 Acidosis: Secondary | ICD-10-CM | POA: Diagnosis not present

## 2020-12-16 LAB — CBC WITH DIFFERENTIAL/PLATELET
Abs Immature Granulocytes: 0.26 10*3/uL — ABNORMAL HIGH (ref 0.00–0.07)
Basophils Absolute: 0.1 10*3/uL (ref 0.0–0.1)
Basophils Relative: 0 %
Eosinophils Absolute: 0 10*3/uL (ref 0.0–0.5)
Eosinophils Relative: 0 %
HCT: 29 % — ABNORMAL LOW (ref 36.0–46.0)
Hemoglobin: 9.7 g/dL — ABNORMAL LOW (ref 12.0–15.0)
Immature Granulocytes: 1 %
Lymphocytes Relative: 1 %
Lymphs Abs: 0.4 10*3/uL — ABNORMAL LOW (ref 0.7–4.0)
MCH: 30.4 pg (ref 26.0–34.0)
MCHC: 33.4 g/dL (ref 30.0–36.0)
MCV: 90.9 fL (ref 80.0–100.0)
Monocytes Absolute: 1.6 10*3/uL — ABNORMAL HIGH (ref 0.1–1.0)
Monocytes Relative: 5 %
Neutro Abs: 30.8 10*3/uL — ABNORMAL HIGH (ref 1.7–7.7)
Neutrophils Relative %: 93 %
Platelets: 132 10*3/uL — ABNORMAL LOW (ref 150–400)
RBC: 3.19 MIL/uL — ABNORMAL LOW (ref 3.87–5.11)
RDW: 19.6 % — ABNORMAL HIGH (ref 11.5–15.5)
Smear Review: NORMAL
WBC: 33.1 10*3/uL — ABNORMAL HIGH (ref 4.0–10.5)
nRBC: 0.1 % (ref 0.0–0.2)

## 2020-12-16 LAB — GLUCOSE, CAPILLARY
Glucose-Capillary: 118 mg/dL — ABNORMAL HIGH (ref 70–99)
Glucose-Capillary: 95 mg/dL (ref 70–99)
Glucose-Capillary: 83 mg/dL (ref 70–99)
Glucose-Capillary: 106 mg/dL — ABNORMAL HIGH (ref 70–99)

## 2020-12-16 LAB — BASIC METABOLIC PANEL
Anion gap: 12 (ref 5–15)
BUN: 42 mg/dL — ABNORMAL HIGH (ref 8–23)
CO2: 21 mmol/L — ABNORMAL LOW (ref 22–32)
Calcium: 6.9 mg/dL — ABNORMAL LOW (ref 8.9–10.3)
Chloride: 98 mmol/L (ref 98–111)
Creatinine, Ser: 7.18 mg/dL — ABNORMAL HIGH (ref 0.44–1.00)
GFR, Estimated: 5 mL/min — ABNORMAL LOW (ref >60)
Glucose, Bld: 105 mg/dL — ABNORMAL HIGH (ref 70–99)
Potassium: 3.4 mmol/L — ABNORMAL LOW (ref 3.5–5.1)
Sodium: 131 mmol/L — ABNORMAL LOW (ref 135–145)

## 2020-12-16 LAB — PATHOLOGIST SMEAR REVIEW

## 2020-12-16 LAB — LACTIC ACID, PLASMA: Lactic Acid, Venous: 1.1 mmol/L (ref 0.5–1.9)

## 2020-12-16 MED ORDER — VANCOMYCIN VARIABLE DOSE PER UNSTABLE RENAL FUNCTION (PHARMACIST DOSING): Status: DC

## 2020-12-16 MED ORDER — FLEET ENEMA 7-19 GM/118ML RE ENEM: 2.0000 | ENEMA | RECTAL | Status: DC

## 2020-12-16 MED ORDER — ALBUMIN HUMAN 5 % IV SOLN
25.0000 g | Freq: Once | INTRAVENOUS | Status: AC
  Administered 2020-12-16: 25 g via INTRAVENOUS
  Filled 2020-12-16: qty 500

## 2020-12-16 NOTE — Progress Notes (Signed)
Patient ID: ABYSSINIA BIELAWSKI, female   DOB: [birth year], 80 y.o.   MRN: [id number] [facility] PROGRESS NOTE  NGINA COTNOIR [id number] DOB: [birth year] DOA: 12/12/2020 PCP: Dion Body, MD  HPI/Subjective: Patient responds when I palpate her abdomen she groans in pain.  Otherwise she did not talk to me today.  Readmitted with colitis and lactic acidosis and elevated white count.  Objective: Vitals:   12/16/20 1230 12/16/20 1245  BP: (!) 85/45 (!) 93/52  Pulse: 97 99  Resp: 14 15  Temp:    SpO2: 98% 98%    Intake/Output Summary (Last 24 hours) at 12/16/2020 1315 Last data filed at 12/16/2020 1300 Gross per 24 hour  Intake 2103.04 ml  Output 0 ml  Net 2103.04 ml   Filed Weights   12/12/20 1220 12/15/20 0530 12/16/20 0500  Weight: 54.4 kg 55 kg 56 kg    ROS: Review of Systems  Unable to perform ROS: Acuity of condition   Exam: Physical Exam HENT:     Head: Normocephalic.     Mouth/Throat:     Pharynx: No oropharyngeal exudate.  Eyes:     General: Lids are normal.     Comments: Patient resisted me opening her eyes.  Cardiovascular:     Rate and Rhythm: Normal rate and regular rhythm.     Heart sounds: Normal heart sounds, S1 normal and S2 normal.  Pulmonary:     Breath sounds: Examination of the right-lower field reveals decreased breath sounds. Examination of the left-lower field reveals decreased breath sounds. Decreased breath sounds present. No wheezing, rhonchi or rales.  Abdominal:     Palpations: Abdomen is soft.     Tenderness: There is generalized abdominal tenderness.  Musculoskeletal:     Right lower leg: No swelling.     Left lower leg: No swelling.  Skin:    General: Skin is warm.     Findings: No rash.  Neurological:     Mental Status: She is lethargic.       Data Reviewed: Basic Metabolic Panel: Recent Labs  Lab 12/12/20 1350 12/13/20 0427 12/14/20 0458 12/15/20 0735 12/16/20 0408  NA 130* 130* 129* 129* 131*  K 3.4* 3.2* 3.0*  2.9* 3.4*  CL 89* 93* 93* 95* 98  CO2 20* 22 20* 21* 21*  GLUCOSE 136* 216* 212* 211* 105*  BUN 33* 34* 37* 38* 42*  CREATININE 9.12* 7.95* 7.57* 7.03* 7.18*  CALCIUM 7.7* 7.1* 7.9* 7.3* 6.9*  MG 1.6*  --   --   --   --    Liver Function Tests: Recent Labs  Lab 12/12/20 1350 12/13/20 0427  AST 29 24  ALT 20 17  ALKPHOS 128* 107  BILITOT 0.4 0.5  PROT 5.3* 4.9*  ALBUMIN 2.0* 1.6*   Recent Labs  Lab 12/12/20 1350 12/13/20 0427 12/14/20 0458 12/15/20 0735  LIPASE 836* 924* 257* 86*   CBC: Recent Labs  Lab 12/13/20 0427 12/13/20 0428 12/14/20 0458 12/15/20 0735 12/16/20 0408  WBC 52.3* 52.3* 38.6* 29.4* 33.1*  NEUTROABS  --  48.8*  --  26.9* 30.8*  HGB 12.1 12.1 11.1* 10.3* 9.7*  HCT 39.1 39.1 34.1* 31.9* 29.0*  MCV 95.1 95.6 92.2 92.2 90.9  PLT 206 206 183 145* 132*    CBG: Recent Labs  Lab 12/15/20 1646 12/15/20 1938 12/15/20 2311 12/16/20 0728 12/16/20 1122  GLUCAP 105* 95 80 118* 95    Recent Results (from the past 240 hour(s))  Culture, blood (routine x 2)  Status: None (Preliminary result)   Collection Time: 12/12/20  1:50 PM   Specimen: BLOOD  Result Value Ref Range Status   Specimen Description BLOOD BLOOD RIGHT ARM  Final   Special Requests   Final    BOTTLES DRAWN AEROBIC AND ANAEROBIC Blood Culture adequate volume   Culture   Final    NO GROWTH 4 DAYS Performed at [facility], [place], [place] [address]    Report Status PENDING  Incomplete  SARS CORONAVIRUS 2 (TAT 6-24 HRS) Nasopharyngeal Nasopharyngeal Swab     Status: None   Collection Time: 12/12/20  2:30 PM   Specimen: Nasopharyngeal Swab  Result Value Ref Range Status   SARS Coronavirus 2 NEGATIVE NEGATIVE Final    Comment: (NOTE) SARS-CoV-2 target nucleic acids are NOT DETECTED.  The SARS-CoV-2 RNA is generally detectable in upper and lower respiratory specimens during the acute phase of infection. Negative results do not preclude SARS-CoV-2  infection, do not rule out co-infections with other pathogens, and should not be used as the sole basis for treatment or other patient management decisions. Negative results must be combined with clinical observations, patient history, and epidemiological information. The expected result is Negative.  Fact Sheet for Patients: SugarRoll.be  Fact Sheet for Healthcare Providers: https://www.woods-mathews.com/  This test is not yet approved or cleared by the [place] FDA and  has been authorized for detection and/or diagnosis of SARS-CoV-2 by FDA under an Emergency Use Authorization (EUA). This EUA will remain  in effect (meaning this test can be used) for the duration of the COVID-19 declaration under Se ction 564(b)(1) of the Act, 21 U.S.C. section 360bbb-3(b)(1), unless the authorization is terminated or revoked sooner.  Performed at [facility], [place] [address], [place], [place] [address]   Culture, blood (routine x 2)     Status: None (Preliminary result)   Collection Time: 12/12/20  2:56 PM   Specimen: BLOOD  Result Value Ref Range Status   Specimen Description BLOOD BLOOD RIGHT FOREARM  Final   Special Requests   Final    BOTTLES DRAWN AEROBIC AND ANAEROBIC Blood Culture adequate volume   Culture   Final    NO GROWTH 4 DAYS Performed at [facility], [address] [place], [place] [address]    Report Status PENDING  Incomplete  Gastrointestinal Panel by PCR , Stool     Status: Abnormal   Collection Time: 12/13/20  9:40 AM   Specimen: Stool  Result Value Ref Range Status   Campylobacter species NOT DETECTED NOT DETECTED Final   Plesimonas shigelloides NOT DETECTED NOT DETECTED Final   Salmonella species NOT DETECTED NOT DETECTED Final   Yersinia enterocolitica NOT DETECTED NOT DETECTED Final   Vibrio species NOT DETECTED NOT DETECTED Final   Vibrio cholerae NOT DETECTED NOT DETECTED Final    Enteroaggregative E coli (EAEC) NOT DETECTED NOT DETECTED Final   Enteropathogenic E coli (EPEC) NOT DETECTED NOT DETECTED Final   Enterotoxigenic E coli (ETEC) NOT DETECTED NOT DETECTED Final   Shiga like toxin producing E coli (STEC) NOT DETECTED NOT DETECTED Final   Shigella/Enteroinvasive E coli (EIEC) NOT DETECTED NOT DETECTED Final   Cryptosporidium NOT DETECTED NOT DETECTED Final   Cyclospora cayetanensis NOT DETECTED NOT DETECTED Final   Entamoeba histolytica NOT DETECTED NOT DETECTED Final   Giardia lamblia NOT DETECTED NOT DETECTED Final   Adenovirus F40/41 DETECTED (A) NOT DETECTED Final   Astrovirus NOT DETECTED NOT DETECTED Final  Norovirus GI/GII NOT DETECTED NOT DETECTED Final   Rotavirus A NOT DETECTED NOT DETECTED Final   Sapovirus (I, II, IV, and V) NOT DETECTED NOT DETECTED Final    Comment: Performed at [facility], [place], [place] [address]  C Difficile Quick Screen (NO PCR Reflex)     Status: Abnormal   Collection Time: 12/13/20  9:40 AM   Specimen: STOOL  Result Value Ref Range Status   C Diff antigen POSITIVE (A) NEGATIVE Final   C Diff toxin NEGATIVE NEGATIVE Final   C Diff interpretation   Final    Results are indeterminate. Please contact the provider listed for your campus for C diff questions in [place].    Comment: Performed at [facility], [address] [place], [place] [address]  C. Diff by PCR, Reflexed     Status: None   Collection Time: 12/13/20  9:40 AM  Result Value Ref Range Status   Toxigenic C. Difficile by PCR NEGATIVE NEGATIVE Final    Comment: Patient is colonized with non toxigenic C. difficile. May not need treatment unless significant symptoms are present. Performed at [facility], [address] [place], [place] [address]   MRSA PCR Screening     Status: None   Collection Time: 12/15/20  1:35 PM   Specimen: Nasal Mucosa; Nasopharyngeal  Result Value Ref Range Status   MRSA by PCR  NEGATIVE NEGATIVE Final    Comment:        The GeneXpert MRSA Assay (FDA approved for NASAL specimens only), is one component of a comprehensive MRSA colonization surveillance program. It is not intended to diagnose MRSA infection nor to guide or monitor treatment for MRSA infections. Performed at [facility], [address] [place], [place] [address]      Studies: PERIPHERAL VASCULAR CATHETERIZATION  Result Date: 12/15/2020 See op note   Scheduled Meds: . aspirin  81 mg Oral Daily  . Chlorhexidine Gluconate Cloth  6 each Topical Daily  . escitalopram  10 mg Oral Daily  . fluticasone furoate-vilanterol  1 puff Inhalation Daily   And  . umeclidinium bromide  1 puff Inhalation Daily  . gentamicin cream  1 application Topical Daily  . heparin  5,000 Units Subcutaneous Q8H  . insulin aspart  0-5 Units Subcutaneous QHS  . insulin aspart  0-6 Units Subcutaneous TID WC  . mirtazapine  7.5 mg Oral QHS  . pantoprazole (PROTONIX) IV  40 mg Intravenous Q24H  . potassium chloride  20 mEq Oral Daily  . vancomycin  125 mg Oral Q6H  . vancomycin variable dose per unstable renal function (pharmacist dosing)   Does not apply See admin instructions   Continuous Infusions: . sodium chloride Stopped (12/13/20 1220)  . sodium chloride 250 mL (12/15/20 1457)  . dialysis solution 1.5% low-MG/low-CA    . lactated ringers 75 mL/hr at 12/16/20 1300  . norepinephrine (LEVOPHED) Adult infusion 1 mcg/min (12/16/20 1300)  . piperacillin-tazobactam (ZOSYN)  IV Stopped (12/16/20 1128)   Brief history.  Patient with end-stage renal disease on peritoneal dialysis, peripheral vascular disease.  Patient had a recent hospitalization for acute colitis from 12/05/2020 to 12/08/2020 and was sent home on Augmentin.  Patient still had pain as outpatient and came back to the hospital and readmitted on 12/12/2020.Marland Kitchen  Patient had initial CT scan that was consistent with colitis.  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  The patient had worsening mental status and became hypotensive and was transferred to the ICU on 12/15/2020 for Levophed drip.  Dr. Lucky Cowboy took to the vascular lab and did an angioplasty on the SMA lesion.  Patient has been on antibiotics since admission.  ID consultation.  Patient has leukemoid reaction with elevated white blood cell count.  CT scan of the head ordered.  Full code.  Assessment/Plan:  1. Shock with hypotension despite giving IV fluids on Levophed.  Likely from an infection.  Transferred to the ICU on 12/15/2020.  Full CODE STATUS confirmed yesterday with family.  Antihypertensive medications on hold. 2. Acute colitis with leukocytosis and lactic acidosis.  The patient does have a peritoneal dialysis catheter and could be peritonitis that was partially treated.  White blood cell count in the PD fluid was not over 250.  Patient on empiric Zosyn and vancomycin IV and also on p.o. vancomycin orally even though C. difficile was negative x2.  So far blood cultures negative.  Lactic acid has now normalized to 1.1 3. 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  Dr. Lucky Cowboy took to the vascular lab on 12/15/2020 for angioplasty of the SMA.  Please see operative report 4. Leukemoid reaction with elevated white blood cell count.  White blood cell count was as high as 52.3 but currently down to 33.1.  Continue antibiotics. 5. Hypokalemia.  Yesterday given some potassium runs but will hold today. 6. 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  Case discussed with nephrology on whether they want to convert over to hemodialysis at this time.  We will get swallow evaluation.  Patient only on sliding scale with being n.p.o.  Last hemoglobin A1c 13.0. 7. Acute metabolic encephalopathy.  We will get a CT scan of the head and swallow  evaluation. 8. Hyponatremia and hypomagnesemia 9. COPD on Trelegy inhaler if able to take 10. Hyperlipidemia unspecified.  Hold Lipitor for now 11. Anxiety depression on Lexapro if able to take  Of note I did call the [employer] yesterday about patient's daughter pressure [employer] that stationed in [place] [id number] to get permission to fly back to [place] to visit her mother.        Code Status:     Code Status Orders  (From admission, onward)         Start     Ordered   12/12/20 1739  Full code  Continuous        12/12/20 1739        Code Status History    Date Active Date Inactive Code Status Order ID Comments User Context   12/05/2020 1411 12/08/2020 1612 Full Code [id number]  Ivor Costa, MD Inpatient   12/11/2019 1227 12/14/2019 1844 Full Code [id number]  Ivor Costa, MD ED   07/27/2019 2253 07/30/2019 1856 Full Code [id number]  Epifanio Lesches, MD Inpatient   Advance Care Planning Activity     Family Communication: Spoke with husband at the bedside Disposition Plan: Status is: Inpatient  Dispo: The patient is from: Home              Anticipated d/c is to: To be determined              Anticipated d/c date is: Likely another 5 days here at least  Patient currently in the ICU on Levophed drip to maintain blood pressure   Difficult to place patient.  Hopefully not  Consultants:  Nephrology  Infectious disease  Gastroenterology  Vascular surgery  General surgery  Critical care specialist  Antibiotics:  Vancomycin IV and p.o., IV Zosyn  Time spent: 29 minutes  Nyrah Demos [employer]

## 2020-12-16 NOTE — Progress Notes (Signed)
Dr. Merrilee Jansky gave order for MAP goal of 60 to titrate levophed.

## 2020-12-16 NOTE — Progress Notes (Signed)
SLP Cancellation Note  Patient Details Name: Anita Wells MRN: [id number] DOB: Dec 14, 1940   Cancelled treatment:       Reason Eval/Treat Not Completed: Patient not medically ready;Fatigue/lethargy limiting ability to participate (chart reviewed; consulted NSG re: pt's status) Pt continues to moaning and discomfort of her abdomen per NSG. She is lethargic and sleeping at this time. A Head CT has been ordered as well which pt is preparing to go to. CXR at admit revealed: "No acute cardiopulmonary process. 2. Emphysema.".   Due to pt's Abd discomfort and ongoing assessment as to cause as well as declined State and lethargy, ST services will hold on BSE today d/t increased risk for aspiration secondary to [facility]. Recommend frequent oral care for hygiene and stimulation of swallowing. ST services will f/u tomorrow w/ pt's status. NSG agreed.     Orinda Kenner, MS, CCC-SLP Speech Language Pathologist Rehab Services [phone] [facility] 12/16/2020, 1:50 PM

## 2020-12-16 NOTE — Progress Notes (Cosign Needed Addendum)
GI Inpatient Follow-up Note  Subjective:  Patient seen in follow-up for acute colitis with significant leukocytosis. She is s/p mesenteric angiogram yesterday afternoon with Dr. 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 She underwent treatment of the SMA with stent placement. Per nursing report, no acute events overnight. She was started on pressors this AM. Patient moaning and arouseable, but does not answer questions.   Scheduled Inpatient Medications:  . aspirin  81 mg Oral Daily  . Chlorhexidine Gluconate Cloth  6 each Topical Daily  . escitalopram  10 mg Oral Daily  . fluticasone furoate-vilanterol  1 puff Inhalation Daily   And  . umeclidinium bromide  1 puff Inhalation Daily  . gentamicin cream  1 application Topical Daily  . heparin  5,000 Units Subcutaneous Q8H  . insulin aspart  0-5 Units Subcutaneous QHS  . insulin aspart  0-6 Units Subcutaneous TID WC  . mirtazapine  7.5 mg Oral QHS  . pantoprazole (PROTONIX) IV  40 mg Intravenous Q24H  . potassium chloride  20 mEq Oral Daily  . vancomycin  125 mg Oral Q6H    Continuous Inpatient Infusions:   . sodium chloride Stopped (12/13/20 1220)  . sodium chloride 250 mL (12/15/20 1457)  . albumin human    . dialysis solution 1.5% low-MG/low-CA    . lactated ringers 75 mL/hr at 12/16/20 1000  . norepinephrine (LEVOPHED) Adult infusion 2 mcg/min (12/16/20 1000)  . piperacillin-tazobactam (ZOSYN)  IV 2.25 g (12/16/20 0235)    PRN Inpatient Medications:  sodium chloride, acetaminophen **OR** acetaminophen, albuterol, heparin, HYDROmorphone (DILAUDID) injection, morphine injection, ondansetron **OR** ondansetron (ZOFRAN) IV, ondansetron (ZOFRAN) IV, oxyCODONE-acetaminophen  Review of Systems:  Unable to obtain due to critical illness    Physical Examination: BP (!) 84/48   Pulse 98   Temp (!) 97 F (36.1 C) (Axillary)   Resp 14   Ht 5'  6" (1.676 m)   Wt 56 kg   SpO2 98%   BMI 19.93 kg/m   Ill-appearing female in ICU bed. Moans, but does not answer questions.  HEENT: PEERLA, EOMI, Neck: supple, no JVD or thyromegaly Chest: CTA bilaterally, no wheezes, crackles, or other adventitious sounds CV: RRR, no m/g/c/r Abd: soft, nondistended, diffusely tender to light palpation, no HSM, guarding, ridigity, or rebound tenderness Ext: no edema, well perfused with 2+ pulses, Skin: no rash or lesions noted Lymph: no LAD  Data: Lab Results  Component Value Date   WBC 33.1 (H) 12/16/2020   HGB 9.7 (L) 12/16/2020   HCT 29.0 (L) 12/16/2020   MCV 90.9 12/16/2020   PLT 132 (L) 12/16/2020   Recent Labs  Lab 12/14/20 0458 12/15/20 0735 12/16/20 0408  HGB 11.1* 10.3* 9.7*   Lab Results  Component Value Date   NA 131 (L) 12/16/2020   K 3.4 (L) 12/16/2020   CL 98 12/16/2020   CO2 21 (L) 12/16/2020   BUN 42 (H) 12/16/2020   CREATININE 7.18 (H) 12/16/2020   Lab Results  Component Value Date   ALT 17 12/13/2020   AST 24 12/13/2020   ALKPHOS 107 12/13/2020   BILITOT 0.5 12/13/2020   No results for input(s): APTT, INR, PTT in the last 168 hours. Assessment/Plan:  80 y/o AA female with a PMH of anemia of chronic disease, ESRD on PD, GERD, HTN, and HLD admitted for acute colitis with significant leukocytosis    1. Acute colitis with significant  leukocytosis - pt is s/p mesenteric angiography yesterday with treatment/stenting of SMA by Dr. Lucky Cowboy. Lactic acid within normal limits this AM. Per nursing report, no overt diarrhea or hematochezia.  2. Hypotension - requiring pressors this AM  3. ESRD on PD  4. Multiple comorbidities - COPD, T2DM, etc  Recommendations:  1. Continue serial abdominal examinations 2. C diff is still a possibility despite negative stool studies for active toxin production. Since she was on antibiotics prior to this hospitalization, it is possible that cultures/testing here could be false  negatives.  3. Discussed plan of care with Dr. Windell Moment of General Surgery in detail this afternoon. Patient continues to be critically ill and is requiring vasopressors for hypotension. She has failed to significantly improve since this admission and continues to have significant leukocytosis and diffuse abdominal pain.  4. Plan for flexible sigmoidoscopy tomorrow with Dr. Alice Reichert to allow visualization and take biopsies to r/o pseudomembranous colitis, other colitis  5. NPO after midnight. Will plan for enemas tomorrow before procedure 6. Continuing to follow closely   Please call with questions or concerns.    Octavia Bruckner, PA-C [facility] [phone] (Cell)

## 2020-12-16 NOTE — Progress Notes (Signed)
Dr. Lucky Cowboy present and gave order to start deflating PAD this morning. Levophed drip started for low BP.

## 2020-12-16 NOTE — Progress Notes (Signed)
Arrived to  Place Pt on PD however had to hold due to Bowel movement. Personal Care  Needed before  Setup Dr. Candiss Norse notified of delay

## 2020-12-16 NOTE — Progress Notes (Signed)
[facility] Day(s): 4.   Post op day(s): 1 Day Post-Op.   Interval History: Patient seen and examined. Continue critically ill with septic shock on vasopressors. Otherwise no new events. Patient continue to complain of abdominal pain. Today level of alertness has been fluctuating but is not oriented to give history.   Vital signs in last 24 hours: [min-max] current  Temp:  [96.8 F (36 C)-99.8 F (37.7 C)] 96.8 F (36 C) (02/22 1900) Pulse Rate:  [81-108] 103 (02/22 1900) Resp:  [12-25] 18 (02/22 1900) BP: (77-139)/(45-69) 119/57 (02/22 1900) SpO2:  [94 %-100 %] 100 % (02/22 1900) Weight:  [56 kg] 56 kg (02/22 0500)     Height: '5\' 6"'$  (167.6 cm) Weight: 56 kg BMI (Calculated): 19.94   Physical Exam:  Constitutional: confused, acute over chronically ill Respiratory: breathing non-labored at rest  Cardiovascular: regular rate and sinus rhythm  Gastrointestinal: soft, tender, and distended  Labs:  CBC Latest Ref Rng & Units 12/16/2020 12/15/2020 12/14/2020  WBC 4.0 - 10.5 K/uL 33.1(H) 29.4(H) 38.6(H)  Hemoglobin 12.0 - 15.0 g/dL 9.7(L) 10.3(L) 11.1(L)  Hematocrit 36.0 - 46.0 % 29.0(L) 31.9(L) 34.1(L)  Platelets 150 - 400 K/uL 132(L) 145(L) 183   CMP Latest Ref Rng & Units 12/16/2020 12/15/2020 12/14/2020  Glucose 70 - 99 mg/dL 105(H) 211(H) 212(H)  BUN 8 - 23 mg/dL 42(H) 38(H) 37(H)  Creatinine 0.44 - 1.00 mg/dL 7.18(H) 7.03(H) 7.57(H)  Sodium 135 - 145 mmol/L 131(L) 129(L) 129(L)  Potassium 3.5 - 5.1 mmol/L 3.4(L) 2.9(L) 3.0(L)  Chloride 98 - 111 mmol/L 98 95(L) 93(L)  CO2 22 - 32 mmol/L 21(L) 21(L) 20(L)  Calcium 8.9 - 10.3 mg/dL 6.9(L) 7.3(L) 7.9(L)  Total Protein 6.5 - 8.1 g/dL - - -  Total Bilirubin 0.3 - 1.2 mg/dL - - -  Alkaline Phos 38 - 126 U/L - - -  AST 15 - 41 U/L - - -  ALT 0 - 44 U/L - - -    Imaging studies: new abdominal xray shows non specific gas pattern. No colonic dilation to suggest megacolon.    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  This is a very complicated case since pertinent positives and negatives do not fit one diagnosis.   Differential diagnosis:  Ischemic colitis: patient has chronic arterial disease, severe abdominal tenderness leukocytosis and had elevated lactic acid initially. The lactic acidosis has normalized and WBC decreased but still elevated. The CT angio of the abdomen shows diffuse adequate wall and mucosa enhancement without any signs of ischemia. No ischemic changes on watershed areas.   Mesenteric ischemia (acute or chronic): patient with diffuse abdominal pain with chronic changes of vascular disease, elevated WBC count and had elevated lactic acid. Lactic acid normalized and WBC has decreased from 52,000 but still significantly elevated. Chronic mesenteric ischemia does not present with acute severe abdominal pain, no leukocytosis. The presentation is more chronic abdominal pain with every meal. Acute mesenteric ischemia is more localized to small bowel or right sided colon, which in this patient the colitis has been the main finding on images.   Infectious colitis: initially the diagnosis of admission but patient has not responded to antibiotic therapy. Actually the patient has deteriorated despite adequate antibiotic therapy. Gastrointestinal panel only positive to Adenovirus. Important to mention that patient was on antibiotic therapy for colitis prior to admission so culture  can be falsely negative.  C. Diff colitis: patient with history of treatment of antibiotic therapy presenting with abdominal pain and diarrhea. WBC count reached drastic level of 52,000 which is seen on c diff colitis. C diff Quick Screening test only positive to the antigen on two occasions.    Bacterial Peritonitis: Patient with peritoneal dialysis catheter. Generalized abdominal pain, elevated WBC count, history of colitis. Peritoneal fluid cell count is not diagnostic with PMN < 250. Again, patient with history of Zosyn and Augmenting therapy recently with can cause false negative.   With not significant improvement in the last 48 hours since patient continue on vasopressors, abdominal pain, severe leukocytosis, patient needs further workup. Alternatives are endoscopic visualization that can help differentiate between infectuous/inflammatory vs ischemic colitis, findings of c diff. Repeat ct scan may help to see if patient's colitis is improving or getting worse but will not help to differentiate the cause of colitis. Due to patient abdominal distention and ileus pattern on images, and the severity of the colitis, I suggest to change Vanco to rectal route to ensure effect on large intestine. Oral vanco might never reach the large intestine is there is slow transit or ileus. Agree with broad spectrum antibiotic therapy to cover other causes of infectious colitis or bacterial peritonitis. Revascularization by Vascular surgery also help with possible differential of colitis. Exploratory laparotomy without a diagnosis is low yield since there is high changes that if there is infectious colitis/cdiff without toxic megacolon the decision there might not be any external sign on the intestine to be able to make the decision of proceeding with total colectomy. Another aggressive alternative can be a diagnostic laparoscopy that could help identify obvious bowel ischemia but will not help differentiate of chronic ischemic changes vs infectious/inflammatory causes. Also it requires general anesthesia that is high risk for this patient specially if no diagnosis is certain.   I will continue to follow closely to assist in the management of this patient.   Arnold Long, MD

## 2020-12-16 NOTE — Progress Notes (Signed)
Spoke with Dr. Leslye Peer via secure chat and MD gave order that it's okay for patient to go to MRI without RN if patient is off vasopressors and blood pressure good.

## 2020-12-16 NOTE — Progress Notes (Signed)
[place], [place] 12/16/20  Subjective:   Anita Wells is a 80 y.o. African American female with PMHX of anermia, barett's esophagus, carotid stenosis, CKD, GERD, HTN and Hyperlipidemia. She presents to the ED with abdominal pain. She had a recent hospitalization for colitis. She was prescribed antibiotics and discharged. She continues to have nausea, vomiting and diarrhea. No blood reported. CT scan shows colitis. 2/21-abdominal angiogram; angioplasty and stent to SMA; Tx to ICU  Transferred to ICU post procedure Had to be started on pressors this AM Moaning; not answering questions   Objective:  Vital signs in last 24 hours:  Temp:  [97 F (36.1 C)-99.8 F (37.7 C)] 97 F (36.1 C) (02/22 0812) Pulse Rate:  [81-110] 100 (02/22 0845) Resp:  [12-30] 17 (02/22 0845) BP: (77-141)/(42-77) 104/51 (02/22 0845) SpO2:  [86 %-100 %] 97 % (02/22 0845) Weight:  [56 kg] 56 kg (02/22 0500)  Weight change: 1 kg Filed Weights   12/12/20 1220 12/15/20 0530 12/16/20 0500  Weight: 54.4 kg 55 kg 56 kg    Intake/Output:    Intake/Output Summary (Last 24 hours) at 12/16/2020 0902 Last data filed at 12/16/2020 0800 Gross per 24 hour  Intake 2753.6 ml  Output 0 ml  Net 2753.6 ml     Physical Exam: General: NAD, ill appearing  HEENT Normocephalic, dry oral mucosa and lips  Pulm/lungs Clear bilaterally  CVS/Heart S1-S2 present  Abdomen:  soft, diffuse abdominal tenderness  Extremities: Minimal peripheral edema  Neurologic: Eyes closed, moaning only  Skin: Dry, no lesions  Access: PD catheter       Basic Metabolic Panel:  Recent Labs  Lab 12/12/20 1350 12/13/20 0427 12/14/20 0458 12/15/20 0735 12/16/20 0408  NA 130* 130* 129* 129* 131*  K 3.4* 3.2* 3.0* 2.9* 3.4*  CL 89* 93* 93* 95* 98  CO2 20* 22 20* 21* 21*  GLUCOSE 136* 216* 212* 211* 105*  BUN 33* 34* 37* 38* 42*  CREATININE 9.12* 7.95* 7.57* 7.03* 7.18*  CALCIUM 7.7* 7.1* 7.9* 7.3* 6.9*   MG 1.6*  --   --   --   --      CBC: Recent Labs  Lab 12/13/20 0427 12/13/20 0428 12/14/20 0458 12/15/20 0735 12/16/20 0408  WBC 52.3* 52.3* 38.6* 29.4* 33.1*  NEUTROABS  --  48.8*  --  26.9* 30.8*  HGB 12.1 12.1 11.1* 10.3* 9.7*  HCT 39.1 39.1 34.1* 31.9* 29.0*  MCV 95.1 95.6 92.2 92.2 90.9  PLT 206 206 183 145* 132*     No results found for: HEPBSAG, HEPBSAB, HEPBIGM    Microbiology:  Recent Results (from the past 240 hour(s))  Culture, blood (routine x 2)     Status: None (Preliminary result)   Collection Time: 12/12/20  1:50 PM   Specimen: BLOOD  Result Value Ref Range Status   Specimen Description BLOOD BLOOD RIGHT ARM  Final   Special Requests   Final    BOTTLES DRAWN AEROBIC AND ANAEROBIC Blood Culture adequate volume   Culture   Final    NO GROWTH 4 DAYS Performed at [facility], [address] [place], [place] [address]    Report Status PENDING  Incomplete  SARS CORONAVIRUS 2 (TAT 6-24 HRS) Nasopharyngeal Nasopharyngeal Swab     Status: None   Collection Time: 12/12/20  2:30 PM   Specimen: Nasopharyngeal Swab  Result Value Ref Range Status   SARS Coronavirus 2 NEGATIVE NEGATIVE Final    Comment: (NOTE) SARS-CoV-2 target nucleic  acids are NOT DETECTED.  The SARS-CoV-2 RNA is generally detectable in upper and lower respiratory specimens during the acute phase of infection. Negative results do not preclude SARS-CoV-2 infection, do not rule out co-infections with other pathogens, and should not be used as the sole basis for treatment or other patient management decisions. Negative results must be combined with clinical observations, patient history, and epidemiological information. The expected result is Negative.  Fact Sheet for Patients: SugarRoll.be  Fact Sheet for Healthcare Providers: https://www.woods-mathews.com/  This test is not yet approved or cleared by the [place] FDA and  has  been authorized for detection and/or diagnosis of SARS-CoV-2 by FDA under an Emergency Use Authorization (EUA). This EUA will remain  in effect (meaning this test can be used) for the duration of the COVID-19 declaration under Se ction 564(b)(1) of the Act, 21 U.S.C. section 360bbb-3(b)(1), unless the authorization is terminated or revoked sooner.  Performed at [facility], [place] [address], [place], [place] [address]   Culture, blood (routine x 2)     Status: None (Preliminary result)   Collection Time: 12/12/20  2:56 PM   Specimen: BLOOD  Result Value Ref Range Status   Specimen Description BLOOD BLOOD RIGHT FOREARM  Final   Special Requests   Final    BOTTLES DRAWN AEROBIC AND ANAEROBIC Blood Culture adequate volume   Culture   Final    NO GROWTH 4 DAYS Performed at [facility], [address] [place], [place] [address]    Report Status PENDING  Incomplete  Gastrointestinal Panel by PCR , Stool     Status: Abnormal   Collection Time: 12/13/20  9:40 AM   Specimen: Stool  Result Value Ref Range Status   Campylobacter species NOT DETECTED NOT DETECTED Final   Plesimonas shigelloides NOT DETECTED NOT DETECTED Final   Salmonella species NOT DETECTED NOT DETECTED Final   Yersinia enterocolitica NOT DETECTED NOT DETECTED Final   Vibrio species NOT DETECTED NOT DETECTED Final   Vibrio cholerae NOT DETECTED NOT DETECTED Final   Enteroaggregative E coli (EAEC) NOT DETECTED NOT DETECTED Final   Enteropathogenic E coli (EPEC) NOT DETECTED NOT DETECTED Final   Enterotoxigenic E coli (ETEC) NOT DETECTED NOT DETECTED Final   Shiga like toxin producing E coli (STEC) NOT DETECTED NOT DETECTED Final   Shigella/Enteroinvasive E coli (EIEC) NOT DETECTED NOT DETECTED Final   Cryptosporidium NOT DETECTED NOT DETECTED Final   Cyclospora cayetanensis NOT DETECTED NOT DETECTED Final   Entamoeba histolytica NOT DETECTED NOT DETECTED Final   Giardia lamblia NOT DETECTED NOT DETECTED  Final   Adenovirus F40/41 DETECTED (A) NOT DETECTED Final   Astrovirus NOT DETECTED NOT DETECTED Final   Norovirus GI/GII NOT DETECTED NOT DETECTED Final   Rotavirus A NOT DETECTED NOT DETECTED Final   Sapovirus (I, II, IV, and V) NOT DETECTED NOT DETECTED Final    Comment: Performed at [facility], [address] [place], [place] [address]  C Difficile Quick Screen (NO PCR Reflex)     Status: Abnormal   Collection Time: 12/13/20  9:40 AM   Specimen: STOOL  Result Value Ref Range Status   C Diff antigen POSITIVE (A) NEGATIVE Final   C Diff toxin NEGATIVE NEGATIVE Final   C Diff interpretation   Final    Results are indeterminate. Please contact the provider listed for your campus for C diff questions in [place].    Comment: Performed at [facility], [address],  [place], [place] [address]  C. Diff by PCR, Reflexed     Status: None   Collection Time: 12/13/20  9:40 AM  Result Value Ref Range Status   Toxigenic C. Difficile by PCR NEGATIVE NEGATIVE Final    Comment: Patient is colonized with non toxigenic C. difficile. May not need treatment unless significant symptoms are present. Performed at [facility], [address] [place], [place] [address]   MRSA PCR Screening     Status: None   Collection Time: 12/15/20  1:35 PM   Specimen: Nasal Mucosa; Nasopharyngeal  Result Value Ref Range Status   MRSA by PCR NEGATIVE NEGATIVE Final    Comment:        The GeneXpert MRSA Assay (FDA approved for NASAL specimens only), is one component of a comprehensive MRSA colonization surveillance program. It is not intended to diagnose MRSA infection nor to guide or monitor treatment for MRSA infections. Performed at [facility], [address] [place], [place] [address]     Coagulation Studies: No results for input(s): LABPROT, INR in the last 72 hours.  Urinalysis: No results for input(s): COLORURINE, LABSPEC, PHURINE, GLUCOSEU, HGBUR,  BILIRUBINUR, KETONESUR, PROTEINUR, UROBILINOGEN, NITRITE, LEUKOCYTESUR in the last 72 hours.  Invalid input(s): APPERANCEUR    Imaging: PERIPHERAL VASCULAR CATHETERIZATION  Result Date: 12/15/2020 See op note  CT Angio Abd/Pel w/ and/or w/o  Addendum Date: 12/14/2020   ADDENDUM REPORT: 12/14/2020 13:47 ADDENDUM: Addendum created after discussing results with surgical team, Dr. Peyton Najjar. Dilated loops of distal small bowel without evidence of bowel obstruction/transition. In addition, there is redemonstration of mild wall thickening of transverse colon, without distension or abnormal enhancement. Either of these findings may be seen with ileus or nonspecific enteritis/colitis. Electronically Signed   By: Corrie Mckusick D.O.   On: 12/14/2020 13:47   Result Date: 12/14/2020 CLINICAL DATA:  80 year old female with possible mesenteric ischemia EXAM: CTA ABDOMEN AND PELVIS WITHOUT AND WITH CONTRAST TECHNIQUE: Multidetector CT imaging of the abdomen and pelvis was performed using the standard protocol during bolus administration of intravenous contrast. Multiplanar reconstructed images and MIPs were obtained and reviewed to evaluate the vascular anatomy. CONTRAST:  52m OMNIPAQUE IOHEXOL 350 MG/ML SOLN COMPARISON:  Noncontrast CT comparison 12/12/2020, 12/05/2020 FINDINGS: VASCULAR Aorta: Moderate to advanced atherosclerotic changes of the distal thoracic aorta. Diameter at the hiatus measures 2.4 cm. No dissection flap identified. Infrarenal abdominal aortic aneurysm measuring 2.5 cm at the IMA takeoff. Smallest diameter of the infrarenal abdominal aorta 14 mm more proximally below the renal arteries. Moderate mixed calcified and soft plaque of the abdominal aorta without evidence of high-grade stenosis. Celiac: Atherosclerotic changes at the origin of the celiac artery. 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 There is then reconstitution of the celiac artery  secondary to collateral flow with patency of the left gastric artery, splenic artery, common hepatic artery. SMA: Atherosclerotic changes at the origin of the SMA, with mixed calcified and soft plaque. Estimated 50% narrowing at the origin. Arcades are patent. Renals: - Right: Atherosclerotic changes at the origin of the right renal artery without evidence of high-grade stenosis. - Left: Patent stent at the origin of the left renal artery. The metallic artifact limits the study for evaluation of in stent stenosis or edge stenosis. Artery is patent distal to the stent, small caliber. IMA: While the IMA is favored to be patent at the origin though likely stenotic given the degree of atherosclerotic plaque  of the aorta and IMA. Left colic artery is patent as well as the proximal superior rectal artery. Right lower extremity: Moderate atherosclerotic changes of the right iliac arterial system with mixed calcified and soft plaque throughout the length of the common iliac artery and external iliac artery. No high-grade stenosis or occlusion identified. Hypogastric artery appears occluded at the origin secondary to calcified plaque. No aneurysm. Moderate atherosclerotic changes of the right common femoral artery. Proximal profunda femoris and SFA patent. Left lower extremity: Moderate atherosclerotic changes of the left iliac arterial system with mixed calcified and soft plaque through the length of the common iliac artery, external iliac artery. No high-grade stenosis or occlusion. Calcified plaque at the origin of the hypogastric artery somewhat limits the evaluation, however, hypogastric appears patent. Mild to moderate atherosclerotic changes of the common femoral artery. There is a proximal origin of the lateral circumflex femoral artery. Proximal SFA and profunda femoris are patent. Veins: Unremarkable appearance of the venous system. Review of the MIP images confirms the above findings. NON-VASCULAR Lower chest: No  acute finding of the lower chest. Atelectatic changes at the left greater than right lung base/scarring. Emphysema. Hepatobiliary: Unremarkable appearance of liver. Cholecystectomy. Similar appearance of mildly dilated bile ducts in the hilum of the liver, unchanged from the noncontrast CT studies. No radiopaque stones. No inflammatory changes at the head of the pancreas. Pancreas: Unremarkable Spleen: Unremarkable Adrenals/Urinary Tract: - Right adrenal gland: Unremarkable - Left adrenal gland: Unremarkable. - Right kidney: No hydronephrosis. Punctate calcifications in the hilum of the kidney, either nonobstructive stones or vascular calcifications. Unremarkable right ureter. Multiple subcentimeter low-density rounded lesions, potentially cysts. - Left Kidney: No hydronephrosis. Vascular calcifications in the hilum of the kidney. Unremarkable left ureter. Rounded lesion on the lateral cortex of the left kidney and rounded lesion on the inferior cortex of the left kidney measuring less than 2 cm each. Both of these appear to be complex cysts given their lack of dynamic enhancement. - Urinary Bladder: Urinary bladder is decompressed. Stomach/Bowel: - Stomach: Unremarkable. - Small bowel: Proximal small bowel decompressed. Distal small bowel demonstrates borderline dilation with fluid. No transition point identified. The length of small bowel demonstrates transmural enhancement, with no segment of small bowel demonstrating differential hypoattenuation/lack of enhancement. Terminal ileum is partially fecalized, without distension. - Appendix: Normal appendix. - Colon: The majority of colon demonstrates relative enhancement similar to that of the small bowel, with the appearance of transmural enhancement maintained throughout its length. There are no segments of bowel demonstrating focal wall thickening/edema. Surgical changes of a colonic anastomosis, rectosigmoid junction. Lymphatic: No adenopathy. Mesenteric:  Peritoneal dialysis catheter terminates within the low abdomen/pelvis. Small volume of dialysate. Reproductive: Small volume fluid/tissue within the endometrial canal, unusual for patient of this age. Other: Surgical changes along the midline.  No hernia. Musculoskeletal: No acute displaced fracture. No bony canal narrowing. Degenerative changes of the spine. No aggressive sclerotic lesions. Lucent lesion on the right aspect of the L2 vertebral body present in 2009 and unchanged. 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 Reconstitution of the vessel from collateral flow. -estimated 50% narrowing of the SMA origin secondary to calcified and soft plaque. Distal vessels patent. -at least high-grade stenosis of the IMA origin secondary to atherosclerotic plaque. Left colic artery and the proximal superior rectal artery opacify. These findings support the possibility of at least chronic mesenteric ischemia. The length of small bowel and colon demonstrate transmural enhancement, with no  evidence of ischemic bowel. Dilated loops of distal small bowel without evidence of bowel obstruction/transition. This may be seen with ileus or nonspecific enteritis. 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 Stent appears patent with the metallic artifact limiting evaluation for any in-stent stenosis/edge stenosis. **An incidental finding of potential clinical significance has been found. Ill-defined soft tissue/fluid within the endometrial canal, which is unexpected in a patient of this age. Referral for gynecologic follow-up recommended, did determine the need for any biopsy and/or MRI as uterine malignancy not excluded** . Additional ancillary findings as above. Signed, Dulcy Fanny. Dellia Nims, [facility] Electronically Signed: By: Corrie Mckusick D.O. On: 12/14/2020  13:26     Medications:   . sodium chloride Stopped (12/13/20 1220)  . sodium chloride 250 mL (12/15/20 1457)  . dialysis solution 1.5% low-MG/low-CA    . lactated ringers 75 mL/hr at 12/16/20 0808  . norepinephrine (LEVOPHED) Adult infusion 2 mcg/min (12/16/20 [id number])  . piperacillin-tazobactam (ZOSYN)  IV 2.25 g (12/16/20 0235)   . aspirin  81 mg Oral Daily  . Chlorhexidine Gluconate Cloth  6 each Topical Daily  . escitalopram  10 mg Oral Daily  . fluticasone furoate-vilanterol  1 puff Inhalation Daily   And  . umeclidinium bromide  1 puff Inhalation Daily  . gentamicin cream  1 application Topical Daily  . heparin  5,000 Units Subcutaneous Q8H  . insulin aspart  0-5 Units Subcutaneous QHS  . insulin aspart  0-6 Units Subcutaneous TID WC  . mirtazapine  7.5 mg Oral QHS  . pantoprazole (PROTONIX) IV  40 mg Intravenous Q24H  . potassium chloride  20 mEq Oral Daily  . vancomycin  125 mg Oral Q6H   sodium chloride, acetaminophen **OR** acetaminophen, albuterol, heparin, HYDROmorphone (DILAUDID) injection, morphine injection, ondansetron **OR** ondansetron (ZOFRAN) IV, ondansetron (ZOFRAN) IV, oxyCODONE-acetaminophen  Assessment/ Plan:  80 y.o. female with PMHX of anermia, barett's esophagus, carotid stenosis, CKD, GERD, HTN and Hyperlipidemia. was admitted on 12/12/2020 for Colitis [K52.9]  Active Problems:   Type 2 diabetes mellitus with ESRD (end-stage renal disease) (HCC)   Colitis   Lactic acidosis   Acute pancreatitis without infection or necrosis   Hypotension   Acute metabolic encephalopathy  Colitis [K52.9] Generalized abdominal pain [R10.84]  #. End Stage Renal Disease on Peritoneal Dialysis -CCPD 10 hours, 4 exchanges, 2000 fills -Continue home regimen PD nightly. - PD skipped on Monday to allow for patient rest -Next treatment tonight  #. Anemia of Chronic kidney disease  Lab Results  Component Value Date   HGB 9.7 (L) 12/16/2020   -will monitor for  changes  #. Secondary hyperparathyroidism of renal origin     Component Value Date/Time   PTH 181 (H) 12/12/2019 1505   Lab Results  Component Value Date   PHOS 3.1 12/12/2019  Calcium acetate ordered with meals Monitor calcium and phos level during this admission   #. Diabetes type 2 with CKD Hgb A1c MFr Bld (%)  Date Value  12/12/2019 13.0 (H)  Glucose levels controlled   #Hypokalemia Can be replaced IV as necessary Will monitor.  #Abdominal pain, Mesenteric ischemia Work-up is in progress Differential diagnosis includes mesenteric ischemia colitis.   PD fluid WBC count 144 with 47% neutrophils on December 12, 2020. -CT scan result-Mesenteric disease- s/p angioplasty and stent to SMA (12/15/2020) -transferred to ICU for closer monitoring and BP support -Broad spectrum Abx Vanc, Zosyn as per hospitalist team  Patient is at high risk  of morbidity due to multiple underlying acute on chronic issues.  Consider palliative care evaluation.   LOS: [place] 2/22/20229:02 [place], [place]

## 2020-12-16 NOTE — Progress Notes (Signed)
[facility] Daily Progress Note   Subjective: 12/15/20:  1. Ultrasound guidance for vascular access right femoral artery  2. Catheter placement into celiac artery and superior mesenteric artery from right femoral approach 3. Aortogram and selective angiogram of the celiac artery and superior mesenteric artery 4. Stent to the superior mesenteric artery with 6 mm diameter x 38 mm length balloon expandable stent 5. StarClose closure device right femoral artery  Patient sleeping this AM.  Family member at bedside.  Patient minimally arousable.  Objective: Vitals:   12/16/20 1200 12/16/20 1215 12/16/20 1230 12/16/20 1245  BP: (!) 89/52 (!) 106/55 (!) 85/45 (!) 93/52  Pulse: (!) 106 (!) 101 97 99  Resp: '17 12 14 15  '$ Temp:      TempSrc:      SpO2: 96% 97% 98% 98%  Weight:      Height:        Intake/Output Summary (Last 24 hours) at 12/16/2020 1312 Last data filed at 12/16/2020 1300 Gross per 24 hour  Intake 2103.04 ml  Output 0 ml  Net 2103.04 ml   Physical Exam: A&Ox1, NAD CV: RRR Pulmonary: CTA Bilaterally Abdomen: Soft, Non-tender, Non-distended Right groin:  Access site: Clean dry and intact Vascular: Warm distally to toes   Laboratory: CBC    Component Value Date/Time   WBC 33.1 (H) 12/16/2020 0408   HGB 9.7 (L) 12/16/2020 0408   HCT 29.0 (L) 12/16/2020 0408   PLT 132 (L) 12/16/2020 0408   BMET    Component Value Date/Time   NA 131 (L) 12/16/2020 0408   K 3.4 (L) 12/16/2020 0408   CL 98 12/16/2020 0408   CO2 21 (L) 12/16/2020 0408   GLUCOSE 105 (H) 12/16/2020 0408   BUN 42 (H) 12/16/2020 0408   CREATININE 7.18 (H) 12/16/2020 0408   CALCIUM 6.9 (L) 12/16/2020 0408   GFRNONAA 5 (L) 12/16/2020 0408   GFRAA 6 (L) 12/14/2019 0513   Assessment/Planning: The patient is an 80 year old female who presented with symptoms consistent with mesenteric ischemia status post mesenteric  angiogram with intervention  1) Successful mesenteric angiogram. Stent placement to celiac artery. 2) Unable to prescribe Plavix due to allergy (lip swelling).  She is currently on aspirin and statin for medical management.  3) No further recommendations from vascular at this time.  We will continue to follow along during her inpatient stay and resume outpatient follow-up.  Discussed with Dr. Ellis Parents Genesys Coggeshall PA-C 12/16/2020 1:12 PM

## 2020-12-17 ENCOUNTER — Inpatient Hospital Stay: Payer: Medicare Other | Admitting: Certified Registered Nurse Anesthetist

## 2020-12-17 ENCOUNTER — Encounter
Admission: EM | Disposition: A | Discharge status: Hospice | Payer: Self-pay | Source: Home / Self Care | Attending: Internal Medicine

## 2020-12-17 ENCOUNTER — Ambulatory Visit: Payer: Medicare Other

## 2020-12-17 ENCOUNTER — Inpatient Hospital Stay: Payer: Medicare Other

## 2020-12-17 ENCOUNTER — Encounter: Payer: Self-pay | Admitting: Internal Medicine

## 2020-12-17 DIAGNOSIS — K559 Vascular disorder of intestine, unspecified: Secondary | ICD-10-CM | POA: Diagnosis not present

## 2020-12-17 DIAGNOSIS — R579 Shock, unspecified: Secondary | ICD-10-CM | POA: Diagnosis not present

## 2020-12-17 DIAGNOSIS — K551 Chronic vascular disorders of intestine: Principal | ICD-10-CM

## 2020-12-17 HISTORY — PX: FLEXIBLE SIGMOIDOSCOPY: SHX5431

## 2020-12-17 LAB — GLUCOSE, CAPILLARY
Glucose-Capillary: 235 mg/dL — ABNORMAL HIGH (ref 70–99)
Glucose-Capillary: 169 mg/dL — ABNORMAL HIGH (ref 70–99)
Glucose-Capillary: 154 mg/dL — ABNORMAL HIGH (ref 70–99)
Glucose-Capillary: 119 mg/dL — ABNORMAL HIGH (ref 70–99)
Glucose-Capillary: 158 mg/dL — ABNORMAL HIGH (ref 70–99)

## 2020-12-17 LAB — CULTURE, BLOOD (ROUTINE X 2)
Culture: NO GROWTH
Culture: NO GROWTH
Special Requests: ADEQUATE
Special Requests: ADEQUATE

## 2020-12-17 LAB — RENAL FUNCTION PANEL
Albumin: 1.5 g/dL — ABNORMAL LOW (ref 3.5–5.0)
Anion gap: 13 (ref 5–15)
BUN: 39 mg/dL — ABNORMAL HIGH (ref 8–23)
CO2: 21 mmol/L — ABNORMAL LOW (ref 22–32)
Calcium: 7.2 mg/dL — ABNORMAL LOW (ref 8.9–10.3)
Chloride: 96 mmol/L — ABNORMAL LOW (ref 98–111)
Creatinine, Ser: 6.22 mg/dL — ABNORMAL HIGH (ref 0.44–1.00)
GFR, Estimated: 6 mL/min — ABNORMAL LOW (ref >60)
Glucose, Bld: 237 mg/dL — ABNORMAL HIGH (ref 70–99)
Phosphorus: 3 mg/dL (ref 2.5–4.6)
Potassium: 3.8 mmol/L (ref 3.5–5.1)
Sodium: 130 mmol/L — ABNORMAL LOW (ref 135–145)

## 2020-12-17 LAB — POTASSIUM: Potassium: 3.4 mmol/L — ABNORMAL LOW (ref 3.5–5.1)

## 2020-12-17 LAB — BODY FLUID CELL COUNT WITH DIFFERENTIAL
Eos, Fluid: 0 %
Lymphs, Fluid: 2 %
Monocyte-Macrophage-Serous Fluid: 3 %
Neutrophil Count, Fluid: 95 %
Total Nucleated Cell Count, Fluid: 607 cu mm

## 2020-12-17 LAB — CBC
HCT: 29 % — ABNORMAL LOW (ref 36.0–46.0)
Hemoglobin: 9.6 g/dL — ABNORMAL LOW (ref 12.0–15.0)
MCH: 30 pg (ref 26.0–34.0)
MCHC: 33.1 g/dL (ref 30.0–36.0)
MCV: 90.6 fL (ref 80.0–100.0)
Platelets: 132 10*3/uL — ABNORMAL LOW (ref 150–400)
RBC: 3.2 MIL/uL — ABNORMAL LOW (ref 3.87–5.11)
RDW: 19.7 % — ABNORMAL HIGH (ref 11.5–15.5)
WBC: 34.3 10*3/uL — ABNORMAL HIGH (ref 4.0–10.5)
nRBC: 0.1 % (ref 0.0–0.2)

## 2020-12-17 LAB — PATHOLOGIST SMEAR REVIEW

## 2020-12-17 LAB — MAGNESIUM
Magnesium: 1 mg/dL — ABNORMAL LOW (ref 1.7–2.4)
Magnesium: 2.6 mg/dL — ABNORMAL HIGH (ref 1.7–2.4)

## 2020-12-17 SURGERY — SIGMOIDOSCOPY, FLEXIBLE
Anesthesia: General

## 2020-12-17 MED ORDER — SODIUM CHLORIDE 0.9 % IV SOLN
3.0000 g | INTRAVENOUS | Status: DC
  Administered 2020-12-17: 3 g via INTRAVENOUS
  Filled 2020-12-17 (×2): qty 8

## 2020-12-17 MED ORDER — DICLOFENAC SODIUM 1 % EX GEL
2.0000 g | Freq: Four times a day (QID) | CUTANEOUS | Status: DC
  Administered 2020-12-17 (×4): 2 g via TOPICAL
  Filled 2020-12-17: qty 100

## 2020-12-17 MED ORDER — PROPOFOL 500 MG/50ML IV EMUL
INTRAVENOUS | Status: DC | PRN
  Administered 2020-12-17: 25 ug/kg/min via INTRAVENOUS

## 2020-12-17 MED ORDER — GLYCOPYRROLATE 0.2 MG/ML IJ SOLN
INTRAMUSCULAR | Status: AC
  Filled 2020-12-17: qty 1

## 2020-12-17 MED ORDER — MAGNESIUM SULFATE 4 GM/100ML IV SOLN
4.0000 g | Freq: Once | INTRAVENOUS | Status: AC
  Administered 2020-12-17: 4 g via INTRAVENOUS
  Filled 2020-12-17: qty 100

## 2020-12-17 MED ORDER — FENTANYL CITRATE (PF) 100 MCG/2ML IJ SOLN
INTRAMUSCULAR | Status: AC
  Filled 2020-12-17: qty 2

## 2020-12-17 MED ORDER — POTASSIUM CHLORIDE 10 MEQ/100ML IV SOLN
10.0000 meq | INTRAVENOUS | Status: AC
  Administered 2020-12-17 (×2): 10 meq via INTRAVENOUS
  Filled 2020-12-17 (×2): qty 100

## 2020-12-17 MED ORDER — LIDOCAINE HCL (PF) 2 % IJ SOLN
INTRAMUSCULAR | Status: AC
  Filled 2020-12-17: qty 5

## 2020-12-17 MED ORDER — PROPOFOL 10 MG/ML IV BOLUS
INTRAVENOUS | Status: DC | PRN
  Administered 2020-12-17 (×3): 10 mg via INTRAVENOUS

## 2020-12-17 MED ORDER — FENTANYL CITRATE (PF) 100 MCG/2ML IJ SOLN: 12.5000 ug | INTRAMUSCULAR | Status: DC | PRN

## 2020-12-17 MED ORDER — FENTANYL CITRATE (PF) 100 MCG/2ML IJ SOLN
12.5000 ug | INTRAMUSCULAR | Status: DC | PRN
  Administered 2020-12-17: 12.5 ug via INTRAVENOUS

## 2020-12-17 MED ORDER — SODIUM CHLORIDE 0.9 % IV SOLN: INTRAVENOUS | Status: DC

## 2020-12-17 NOTE — Progress Notes (Signed)
Anita Wells, [place] 12/17/20  Subjective:   Anita Wells is a 80 y.o. African American female with PMHX of anermia, barett's esophagus, carotid stenosis, CKD, GERD, HTN and Hyperlipidemia. She presents to the ED with abdominal pain. She had a recent hospitalization for colitis. She was prescribed antibiotics and discharged. She continues to have nausea, vomiting and diarrhea. No blood reported. CT scan shows colitis. 2/21-abdominal angiogram; angioplasty and stent to SMA; Tx to ICU  Still critically ill More awake today Oral intake is poor Reports rt shoulder arm pain Abdominal pain   Objective:  Vital signs in last 24 hours:  Temp:  [96.8 F (36 C)-97.8 F (36.6 C)] 97.8 F (36.6 C) (02/23 0400) Pulse Rate:  [91-109] 92 (02/23 0600) Resp:  [12-25] 19 (02/23 0600) BP: (81-143)/(45-77) 93/68 (02/23 0600) SpO2:  [94 %-100 %] 98 % (02/23 0600) Weight:  [60.1 kg] 60.1 kg (02/23 0308)  Weight change: 4.1 kg Filed Weights   12/15/20 0530 12/16/20 0500 12/17/20 0308  Weight: 55 kg 56 kg 60.1 kg    Intake/Output:    Intake/Output Summary (Last 24 hours) at 12/17/2020 0900 Last data filed at 12/17/2020 0600 Gross per 24 hour  Intake 2365.44 ml  Output -  Net 2365.44 ml     Physical Exam: General: NAD, ill appearing  HEENT Normocephalic, dry oral mucosa and lips  Pulm/lungs Clear bilaterally  CVS/Heart S1-S2 present  Abdomen:  soft, diffuse abdominal tenderness  Extremities: Minimal peripheral edema  Neurologic: Eyes closed, moaning only  Skin: Dry, no lesions  Access: PD catheter       Basic Metabolic Panel:  Recent Labs  Lab 12/12/20 1350 12/13/20 0427 12/14/20 0458 12/15/20 0735 12/16/20 0408 12/17/20 0003 12/17/20 0705  NA 130* 130* 129* 129* 131*  --  130*  K 3.4* 3.2* 3.0* 2.9* 3.4* 3.4* 3.8  CL 89* 93* 93* 95* 98  --  96*  CO2 20* 22 20* 21* 21*  --  21*  GLUCOSE 136* 216* 212* 211* 105*  --  237*  BUN 33* 34* 37* 38*  42*  --  39*  CREATININE 9.12* 7.95* 7.57* 7.03* 7.18*  --  6.22*  CALCIUM 7.7* 7.1* 7.9* 7.3* 6.9*  --  7.2*  MG 1.6*  --   --   --   --  1.0* 2.6*  PHOS  --   --   --   --   --   --  3.0     CBC: Recent Labs  Lab 12/13/20 0428 12/14/20 0458 12/15/20 0735 12/16/20 0408 12/17/20 0705  WBC 52.3* 38.6* 29.4* 33.1* 34.3*  NEUTROABS 48.8*  --  26.9* 30.8*  --   HGB 12.1 11.1* 10.3* 9.7* 9.6*  HCT 39.1 34.1* 31.9* 29.0* 29.0*  MCV 95.6 92.2 92.2 90.9 90.6  PLT 206 183 145* 132* 132*     No results found for: HEPBSAG, HEPBSAB, HEPBIGM    Microbiology:  Recent Results (from the past 240 hour(s))  Culture, blood (routine x 2)     Status: None   Collection Time: 12/12/20  1:50 PM   Specimen: BLOOD  Result Value Ref Range Status   Specimen Description BLOOD BLOOD RIGHT ARM  Final   Special Requests   Final    BOTTLES DRAWN AEROBIC AND ANAEROBIC Blood Culture adequate volume   Culture   Final    NO GROWTH 5 DAYS Performed at [facility], [address], [place], [place] [address]    Report Status  12/17/2020 FINAL  Final  SARS CORONAVIRUS 2 (TAT 6-24 HRS) Nasopharyngeal Nasopharyngeal Swab     Status: None   Collection Time: 12/12/20  2:30 PM   Specimen: Nasopharyngeal Swab  Result Value Ref Range Status   SARS Coronavirus 2 NEGATIVE NEGATIVE Final    Comment: (NOTE) SARS-CoV-2 target nucleic acids are NOT DETECTED.  The SARS-CoV-2 RNA is generally detectable in upper and lower respiratory specimens during the acute phase of infection. Negative results do not preclude SARS-CoV-2 infection, do not rule out co-infections with other pathogens, and should not be used as the sole basis for treatment or other patient management decisions. Negative results must be combined with clinical observations, patient history, and epidemiological information. The expected result is Negative.  Fact Sheet for Patients: SugarRoll.be  Fact Sheet  for Healthcare Providers: https://www.woods-mathews.com/  This test is not yet approved or cleared by the [place] FDA and  has been authorized for detection and/or diagnosis of SARS-CoV-2 by FDA under an Emergency Use Authorization (EUA). This EUA will remain  in effect (meaning this test can be used) for the duration of the COVID-19 declaration under Se ction 564(b)(1) of the Act, 21 U.S.C. section 360bbb-3(b)(1), unless the authorization is terminated or revoked sooner.  Performed at [facility], [place] [address] [place], [place] [address]   Culture, blood (routine x 2)     Status: None   Collection Time: 12/12/20  2:56 PM   Specimen: BLOOD  Result Value Ref Range Status   Specimen Description BLOOD BLOOD RIGHT FOREARM  Final   Special Requests   Final    BOTTLES DRAWN AEROBIC AND ANAEROBIC Blood Culture adequate volume   Culture   Final    NO GROWTH 5 DAYS Performed at [facility], [address] [place], [place] [address]    Report Status 12/17/2020 FINAL  Final  Gastrointestinal Panel by PCR , Stool     Status: Abnormal   Collection Time: 12/13/20  9:40 AM   Specimen: Stool  Result Value Ref Range Status   Campylobacter species NOT DETECTED NOT DETECTED Final   Plesimonas shigelloides NOT DETECTED NOT DETECTED Final   Salmonella species NOT DETECTED NOT DETECTED Final   Yersinia enterocolitica NOT DETECTED NOT DETECTED Final   Vibrio species NOT DETECTED NOT DETECTED Final   Vibrio cholerae NOT DETECTED NOT DETECTED Final   Enteroaggregative E coli (EAEC) NOT DETECTED NOT DETECTED Final   Enteropathogenic E coli (EPEC) NOT DETECTED NOT DETECTED Final   Enterotoxigenic E coli (ETEC) NOT DETECTED NOT DETECTED Final   Shiga like toxin producing E coli (STEC) NOT DETECTED NOT DETECTED Final   Shigella/Enteroinvasive E coli (EIEC) NOT DETECTED NOT DETECTED Final   Cryptosporidium NOT DETECTED NOT DETECTED Final   Cyclospora cayetanensis NOT  DETECTED NOT DETECTED Final   Entamoeba histolytica NOT DETECTED NOT DETECTED Final   Giardia lamblia NOT DETECTED NOT DETECTED Final   Adenovirus F40/41 DETECTED (A) NOT DETECTED Final   Astrovirus NOT DETECTED NOT DETECTED Final   Norovirus GI/GII NOT DETECTED NOT DETECTED Final   Rotavirus A NOT DETECTED NOT DETECTED Final   Sapovirus (I, II, IV, and V) NOT DETECTED NOT DETECTED Final    Comment: Performed at [facility], [address] [place], [place] [address]  C Difficile Quick Screen (NO PCR Reflex)     Status: Abnormal   Collection Time: 12/13/20  9:40 AM   Specimen: STOOL  Result Value Ref Range Status   C Diff  antigen POSITIVE (A) NEGATIVE Final   C Diff toxin NEGATIVE NEGATIVE Final   C Diff interpretation   Final    Results are indeterminate. Please contact the provider listed for your campus for C diff questions in [place].    Comment: Performed at [facility], [address] [place], [place] [address]  C. Diff by PCR, Reflexed     Status: None   Collection Time: 12/13/20  9:40 AM  Result Value Ref Range Status   Toxigenic C. Difficile by PCR NEGATIVE NEGATIVE Final    Comment: Patient is colonized with non toxigenic C. difficile. May not need treatment unless significant symptoms are present. Performed at [facility], [address] [place], [place] [address]   MRSA PCR Screening     Status: None   Collection Time: 12/15/20  1:35 PM   Specimen: Nasal Mucosa; Nasopharyngeal  Result Value Ref Range Status   MRSA by PCR NEGATIVE NEGATIVE Final    Comment:        The GeneXpert MRSA Assay (FDA approved for NASAL specimens only), is one component of a comprehensive MRSA colonization surveillance program. It is not intended to diagnose MRSA infection nor to guide or monitor treatment for MRSA infections. Performed at [facility], [address] [place], [place] [address]     Coagulation Studies: No results for  input(s): LABPROT, INR in the last 72 hours.  Urinalysis: No results for input(s): COLORURINE, LABSPEC, PHURINE, GLUCOSEU, HGBUR, BILIRUBINUR, KETONESUR, PROTEINUR, UROBILINOGEN, NITRITE, LEUKOCYTESUR in the last 72 hours.  Invalid input(s): APPERANCEUR    Imaging: CT HEAD WO CONTRAST  Result Date: 12/16/2020 CLINICAL DATA:  Mental status change. EXAM: CT HEAD WITHOUT CONTRAST TECHNIQUE: Contiguous axial images were obtained from the base of the skull through the vertex without intravenous contrast. COMPARISON:  MRI head June 11th, 2012 FINDINGS: Brain: Abnormal hypoattenuation right frontal lobe, which appears to predominantly involve white matter. This finding is new relative to remote MRI from 2012. No substantial mass effect. No midline shift. No acute hemorrhage. No hydrocephalus. No extra-axial fluid collection. Partially empty and expanded sella. Vascular: Calcific atherosclerosis. No hyperdense vessel identified. Skull: No acute fracture. Sinuses/Orbits: Visualized sinuses are clear.  Unremarkable orbits. Other: No mastoid effusions. IMPRESSION: Abnormal hypoattenuation in the right frontal lobe, which appears to predominantly involve the white matter. While this could relate to prior ischemia, findings are nonspecific by CT. Recommend MRI (with contrast if the patient is able) to further evaluate and exclude other etiologies. These results will be called to the ordering clinician or representative by the Radiologist Assistant, and communication documented in the PACS or [employer]. Electronically Signed   By: Margaretha Sheffield MD   On: 12/16/2020 15:38   PERIPHERAL VASCULAR CATHETERIZATION  Result Date: 12/15/2020 See op note  DG Abd 2 Views  Result Date: 12/16/2020 CLINICAL DATA:  Ileus EXAM: ABDOMEN - 2 VIEW COMPARISON:  CT angiography of the abdomen and pelvis from December 14, 2020 FINDINGS: Peritoneal dialysis catheter projects over the RIGHT and central abdomen tracking  towards the LEFT pelvis as on recent imaging. Signs of vascular disease and prior LEFT renal artery stenting as well as postoperative changes in the RIGHT upper quadrant as on the previous study. Since the prior exam a stent is been placed along the expected course of the SMA. Nonspecific bowel gas pattern, no overt signs of obstruction. Scattered gas-filled loops of bowel throughout the abdomen, relative paucity of gas. Anastomotic changes in the pelvis from prior  colonic surgery. Decubitus view without signs of free air. On limited assessment no acute skeletal process. IMPRESSION: 1. Nonspecific bowel gas pattern without overt signs of obstruction or free air. 2. Peritoneal dialysis catheter remains in place. Electronically Signed   By: Zetta Bills M.D.   On: 12/16/2020 15:08     Medications:   . sodium chloride 10 mL/hr at 12/16/20 1900  . sodium chloride 250 mL (12/15/20 1457)  . dialysis solution 1.5% low-MG/low-CA    . lactated ringers 75 mL/hr at 12/17/20 0600  . norepinephrine (LEVOPHED) Adult infusion 2 mcg/min (12/17/20 0600)  . piperacillin-tazobactam (ZOSYN)  IV 2.25 g (12/17/20 0357)   . aspirin  81 mg Oral Daily  . Chlorhexidine Gluconate Cloth  6 each Topical Daily  . diclofenac Sodium  2 g Topical QID  . escitalopram  10 mg Oral Daily  . fentaNYL      . fluticasone furoate-vilanterol  1 puff Inhalation Daily   And  . umeclidinium bromide  1 puff Inhalation Daily  . gentamicin cream  1 application Topical Daily  . heparin  5,000 Units Subcutaneous Q8H  . insulin aspart  0-5 Units Subcutaneous QHS  . insulin aspart  0-6 Units Subcutaneous TID WC  . mirtazapine  7.5 mg Oral QHS  . pantoprazole (PROTONIX) IV  40 mg Intravenous Q24H  . potassium chloride  20 mEq Oral Daily  . sodium phosphate  2 enema Rectal UD  . vancomycin  125 mg Oral Q6H   sodium chloride, acetaminophen **OR** acetaminophen, albuterol, fentaNYL (SUBLIMAZE) injection, heparin, morphine injection,  ondansetron **OR** ondansetron (ZOFRAN) IV, ondansetron (ZOFRAN) IV, oxyCODONE-acetaminophen  Assessment/ Plan:  80 y.o. female with PMHX of anermia, barett's esophagus, carotid stenosis, CKD, GERD, HTN and Hyperlipidemia. was admitted on 12/12/2020 for Colitis [K52.9]  Active Problems:   Type 2 diabetes mellitus with ESRD (end-stage renal disease) (HCC)   Colitis   Lactic acidosis   Acute pancreatitis without infection or necrosis   Hypotension   Acute metabolic encephalopathy   Shock ([place])   PVD (peripheral vascular disease) ([place])  Colitis [K52.9] Generalized abdominal pain [R10.84]  #. End Stage Renal Disease on Peritoneal Dialysis -CCPD 10 hours, 4 exchanges, 2000 fills -Continue home regimen PD nightly. -Next treatment tonight -will obtain cell count and culture   #. Anemia of Chronic kidney disease  Lab Results  Component Value Date   HGB 9.6 (L) 12/17/2020   -will monitor for changes  #. Secondary hyperparathyroidism of renal origin     Component Value Date/Time   PTH 181 (H) 12/12/2019 1505   Lab Results  Component Value Date   PHOS 3.0 12/17/2020  Calcium acetate ordered with meals Monitor calcium and phos level during this admission   #. Diabetes type 2 with CKD Hgb A1c MFr Bld (%)  Date Value  12/12/2019 13.0 (H)  no recent A1c   #Hypokalemia Can be replaced IV as necessary Will monitor.  #Abdominal pain, Mesenteric ischemia Work-up is in progress Differential diagnosis includes mesenteric ischemia colitis.   PD fluid WBC count 144 with 47% neutrophils on December 12, 2020. -CT scan result-Mesenteric disease- s/p angioplasty and stent to SMA (12/15/2020) -transferred to ICU for closer monitoring and BP support -Broad spectrum Abx Vanc, Zosyn as per hospitalist team  Patient is at high risk of morbidity due to multiple underlying acute on chronic issues.  Consider palliative care evaluation.   LOS: [place] 2/23/20229:00 AM  [facility] [place], [place]

## 2020-12-17 NOTE — Plan of Care (Signed)
Pt off levophed 0930 this am and BP has remained adequate. Sigmoidoscopy done midday, demonstrated ischemic bowel with ulcerations. Palliative care consulted after findings. 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 Husband at bedside for most of day, did pull me aside and discuss overall picture of pt condition. Appears to understand gravity of situation. Pt mentation improved over course of day, with speech becoming more intelligible and overall responsiveness increased. PD started this evening. Pt has remained NPO, awaiting eval by SLP.  Problem: Coping: Goal: Level of anxiety will decrease Outcome: Progressing   Problem: Education: Goal: Knowledge of General Education information will improve Description: Including pain rating scale, medication(s)/side effects and non-pharmacologic comfort measures Outcome: Not Progressing   Problem: Health Behavior/Discharge Planning: Goal: Ability to manage health-related needs will improve Outcome: Not Progressing   Problem: Clinical Measurements: Goal: Ability to maintain clinical measurements within normal limits will improve Outcome: Not Progressing Goal: Will remain free from infection Outcome: Not Progressing Goal: Diagnostic test results will improve Outcome: Not Progressing Goal: Respiratory complications will improve Outcome: Not Progressing Goal: Cardiovascular complication will be avoided Outcome: Not Progressing   Problem: Activity: Goal: Risk for activity intolerance will decrease Outcome: Not Progressing   Problem: Nutrition: Goal: Adequate nutrition will be maintained Outcome: Not Progressing   Problem: Elimination: Goal: Will not experience complications related to bowel motility Outcome: Not Progressing Goal: Will not experience complications related to urinary retention Outcome: Not Progressing   Problem: Pain Managment: Goal: General experience of comfort will improve Outcome: Not  Progressing   Problem: Safety: Goal: Ability to remain free from injury will improve Outcome: Not Progressing   Problem: Skin Integrity: Goal: Risk for impaired skin integrity will decrease Outcome: Not Progressing

## 2020-12-17 NOTE — Progress Notes (Signed)
PROGRESS NOTE    Anita Wells  [id number] DOB: January 04, 1941 DOA: 12/12/2020 PCP: Anita Body, MD   Chief complaint.  Abdominal pain. Brief Narrative:  Patient with end-stage renal disease on peritoneal dialysis, peripheral vascular disease.  Patient had a recent hospitalization for acute colitis from 12/05/2020 to 12/08/2020 and was sent home on Augmentin.  Patient still had pain as outpatient and came back to the hospital and readmitted on 12/12/2020.Marland Kitchen  Patient had initial CT scan that was consistent with colitis.  We repeated a CT angio that did show blockage of the celiac artery and blockages in the SMA and IMA.  The patient had worsening mental status and became hypotensive and was transferred to the ICU on 12/15/2020 for Levophed drip.  Dr. Lucky Cowboy took to the vascular lab and did an angioplasty on the SMA lesion.  Sigmoidoscopy was performed on 2/23, reviewed diffuse ulceration in the colon consistent with severe ischemia.   Assessment & Plan:   Active Problems:   Type 2 diabetes mellitus with ESRD (end-stage renal disease) (HCC)   Colitis   Lactic acidosis   Acute pancreatitis without infection or necrosis   Hypotension   Acute metabolic encephalopathy   Shock ([place])   PVD (peripheral vascular disease) ([place])  #1.  Hypovolemic shock secondary to bowel ischemia. Bowel ischemia status post SMA angioplasty. Leukemoid reaction secondary to bowel ischemia. Patient currently is more hemodynamically stable.  Vasopressors discontinued. Patient had a sigmoidoscopy performed today, reviewed diffuse sigmoid ulceration consistent with severe ischemia.  Has SMA angioplasty, but ischemic damages to the bowl may have already occurred.  She will not be able to eat at this time due to concern of perforation.  Has not get any nutrition since admission.  She will need a TPN, however, with her peritoneal dialysis, she cannot be volume overloaded pretty quickly.  If the decision is made to start TPN, he  probably need to be started on hemodialysis. At this point, patient prognosis is poor.  Obtain palliative consult.  #2.  End-stage renal disease on peritoneal dialysis. Hyponatremia. As above.  3.  Uncontrolled type 2 diabetes with hyperglycemia. Continue sliding scale insulin as patient has not been able to take any p.o.  4.  COPD. No bronchospasm.  5.  C. difficile colonization. No evidence of C. difficile colitis on sigmoidoscopy.  Discussed with ID, vancomycin will be discontinued.  Goal of Care discussion. Patient and patient husband does not seem to understand patient condition.  Case discussed with patient daughter, Anita Wells.  Patient prognosis is poor based on above conditions.  Daughter is not convinced that patient can tolerate hemodialysis.  She will talk to her family members about the family decision.  I will continue the conversation with general surgery, nephrology.  In the meantime, consult palliative care.    DVT prophylaxis: Heparin Code Status: Full Family Communication: As above Disposition Plan:  .   Status is: Inpatient  Remains inpatient appropriate because:Inpatient level of care appropriate due to severity of illness   Dispo: The patient is from: Home              Anticipated d/c is to: Home              Anticipated d/c date is: 2 days              Patient currently is not medically stable to d/c.   Difficult to place patient No        I/O last 3 completed shifts: In:  3456 [I.V.:2539.6; IV Piggyback:916.5] Out: -  Total I/O In: 490.9 [I.V.:490.9] Out: -      Consultants:   ID, Surgery, Vascular, GI, Nephrology  Procedures: Peritoneal dialysis  Antimicrobials:None  Subjective: Patient has some confusion, currently does not have any shortness of breath. She has some abdominal pain, but no nausea vomiting. No fever or chills. No chest pain or palpitation.    Objective: Vitals:   12/17/20 1133 12/17/20 1235 12/17/20 1315 12/17/20  1400  BP:  129/79 112/66 108/62  Pulse: 92  91   Resp: '16  19 16  '$ Temp: 97.8 F (36.6 C) 98.3 F (36.8 C) 97.8 F (36.6 C)   TempSrc:  Temporal Oral   SpO2: 98%  100%   Weight:      Height:        Intake/Output Summary (Last 24 hours) at 12/17/2020 1444 Last data filed at 12/17/2020 1128 Gross per 24 hour  Intake 2285.21 ml  Output --  Net 2285.21 ml   Filed Weights   12/15/20 0530 12/16/20 0500 12/17/20 0308  Weight: 55 kg 56 kg 60.1 kg    Examination:  General exam: Appears calm and comfortable  Respiratory system: Clear to auscultation. Respiratory effort normal. Cardiovascular system: S1 & S2 heard, RRR. No JVD, murmurs, rubs, gallops or clicks. No pedal edema. Gastrointestinal system: Abdomen is distended, soft and tender. No organomegaly or masses felt. Normal bowel sounds heard. Central nervous system: Alert and oriented x2.  No focal neurological deficits. Extremities: Symmetric  Skin: No rashes, lesions or ulcers Psychiatry:  Mood & affect appropriate.     Data Reviewed: I have personally reviewed following labs and imaging studies  CBC: Recent Labs  Lab 12/13/20 0428 12/14/20 0458 12/15/20 0735 12/16/20 0408 12/17/20 0705  WBC 52.3* 38.6* 29.4* 33.1* 34.3*  NEUTROABS 48.8*  --  26.9* 30.8*  --   HGB 12.1 11.1* 10.3* 9.7* 9.6*  HCT 39.1 34.1* 31.9* 29.0* 29.0*  MCV 95.6 92.2 92.2 90.9 90.6  PLT 206 183 145* 132* [id number]*   Basic Metabolic Panel: Recent Labs  Lab 12/12/20 1350 12/13/20 0427 12/14/20 0458 12/15/20 0735 12/16/20 0408 12/17/20 0003 12/17/20 0705  NA 130* 130* 129* 129* 131*  --  130*  K 3.4* 3.2* 3.0* 2.9* 3.4* 3.4* 3.8  CL 89* 93* 93* 95* 98  --  96*  CO2 20* 22 20* 21* 21*  --  21*  GLUCOSE 136* 216* 212* 211* 105*  --  237*  BUN 33* 34* 37* 38* 42*  --  39*  CREATININE 9.12* 7.95* 7.57* 7.03* 7.18*  --  6.22*  CALCIUM 7.7* 7.1* 7.9* 7.3* 6.9*  --  7.2*  MG 1.6*  --   --   --   --  1.0* 2.6*  PHOS  --   --   --   --   --    --  3.0   GFR: Estimated Creatinine Clearance: 6.6 mL/min (A) (by C-G formula based on SCr of 6.22 mg/dL (H)). Liver Function Tests: Recent Labs  Lab 12/12/20 1350 12/13/20 0427 12/17/20 0705  AST 29 24  --   ALT 20 17  --   ALKPHOS 128* 107  --   BILITOT 0.4 0.5  --   PROT 5.3* 4.9*  --   ALBUMIN 2.0* 1.6* 1.5*   Recent Labs  Lab 12/12/20 1350 12/13/20 0427 12/14/20 0458 12/15/20 0735  LIPASE 836* 924* 257* 86*   No results for input(s): AMMONIA in the last 168 hours.  Coagulation Profile: No results for input(s): INR, PROTIME in the last 168 hours. Cardiac Enzymes: No results for input(s): CKTOTAL, CKMB, CKMBINDEX, TROPONINI in the last 168 hours. BNP (last 3 results) No results for input(s): PROBNP in the last 8760 hours. HbA1C: No results for input(s): HGBA1C in the last 72 hours. CBG: Recent Labs  Lab 12/16/20 1122 12/16/20 1619 12/16/20 2109 12/17/20 0722 12/17/20 1337  GLUCAP 95 83 106* 235* 154*   Lipid Profile: No results for input(s): CHOL, HDL, LDLCALC, TRIG, CHOLHDL, LDLDIRECT in the last 72 hours. Thyroid Function Tests: No results for input(s): TSH, T4TOTAL, FREET4, T3FREE, THYROIDAB in the last 72 hours. Anemia Panel: No results for input(s): VITAMINB12, FOLATE, FERRITIN, TIBC, IRON, RETICCTPCT in the last 72 hours. Sepsis Labs: Recent Labs  Lab 12/12/20 1644 12/14/20 0458 12/15/20 1224 12/16/20 0408  LATICACIDVEN 3.0* 3.7* 2.1* 1.1    Recent Results (from the past 240 hour(s))  Culture, blood (routine x 2)     Status: None   Collection Time: 12/12/20  1:50 PM   Specimen: BLOOD  Result Value Ref Range Status   Specimen Description BLOOD BLOOD RIGHT ARM  Final   Special Requests   Final    BOTTLES DRAWN AEROBIC AND ANAEROBIC Blood Culture adequate volume   Culture   Final    NO GROWTH 5 DAYS Performed at [facility], [address], [place], [place] [address]    Report Status 12/17/2020 FINAL  Final  SARS CORONAVIRUS 2  (TAT 6-24 HRS) Nasopharyngeal Nasopharyngeal Swab     Status: None   Collection Time: 12/12/20  2:30 PM   Specimen: Nasopharyngeal Swab  Result Value Ref Range Status   SARS Coronavirus 2 NEGATIVE NEGATIVE Final    Comment: (NOTE) SARS-CoV-2 target nucleic acids are NOT DETECTED.  The SARS-CoV-2 RNA is generally detectable in upper and lower respiratory specimens during the acute phase of infection. Negative results do not preclude SARS-CoV-2 infection, do not rule out co-infections with other pathogens, and should not be used as the sole basis for treatment or other patient management decisions. Negative results must be combined with clinical observations, patient history, and epidemiological information. The expected result is Negative.  Fact Sheet for Patients: SugarRoll.be  Fact Sheet for Healthcare Providers: https://www.woods-mathews.com/  This test is not yet approved or cleared by the [place] FDA and  has been authorized for detection and/or diagnosis of SARS-CoV-2 by FDA under an Emergency Use Authorization (EUA). This EUA will remain  in effect (meaning this test can be used) for the duration of the COVID-19 declaration under Se ction 564(b)(1) of the Act, 21 U.S.C. section 360bbb-3(b)(1), unless the authorization is terminated or revoked sooner.  Performed at [facility], [place] [address], [place], [place] [address]   Culture, blood (routine x 2)     Status: None   Collection Time: 12/12/20  2:56 PM   Specimen: BLOOD  Result Value Ref Range Status   Specimen Description BLOOD BLOOD RIGHT FOREARM  Final   Special Requests   Final    BOTTLES DRAWN AEROBIC AND ANAEROBIC Blood Culture adequate volume   Culture   Final    NO GROWTH 5 DAYS Performed at [facility], [address], [place], [place] [address]    Report Status 12/17/2020 FINAL  Final  Gastrointestinal Panel by PCR , Stool     Status:  Abnormal   Collection Time: 12/13/20  9:40 AM   Specimen: Stool  Result Value Ref Range Status  Campylobacter species NOT DETECTED NOT DETECTED Final   Plesimonas shigelloides NOT DETECTED NOT DETECTED Final   Salmonella species NOT DETECTED NOT DETECTED Final   Yersinia enterocolitica NOT DETECTED NOT DETECTED Final   Vibrio species NOT DETECTED NOT DETECTED Final   Vibrio cholerae NOT DETECTED NOT DETECTED Final   Enteroaggregative E coli (EAEC) NOT DETECTED NOT DETECTED Final   Enteropathogenic E coli (EPEC) NOT DETECTED NOT DETECTED Final   Enterotoxigenic E coli (ETEC) NOT DETECTED NOT DETECTED Final   Shiga like toxin producing E coli (STEC) NOT DETECTED NOT DETECTED Final   Shigella/Enteroinvasive E coli (EIEC) NOT DETECTED NOT DETECTED Final   Cryptosporidium NOT DETECTED NOT DETECTED Final   Cyclospora cayetanensis NOT DETECTED NOT DETECTED Final   Entamoeba histolytica NOT DETECTED NOT DETECTED Final   Giardia lamblia NOT DETECTED NOT DETECTED Final   Adenovirus F40/41 DETECTED (A) NOT DETECTED Final   Astrovirus NOT DETECTED NOT DETECTED Final   Norovirus GI/GII NOT DETECTED NOT DETECTED Final   Rotavirus A NOT DETECTED NOT DETECTED Final   Sapovirus (I, II, IV, and V) NOT DETECTED NOT DETECTED Final    Comment: Performed at [facility], [address] [place], [place] [address]  C Difficile Quick Screen (NO PCR Reflex)     Status: Abnormal   Collection Time: 12/13/20  9:40 AM   Specimen: STOOL  Result Value Ref Range Status   C Diff antigen POSITIVE (A) NEGATIVE Final   C Diff toxin NEGATIVE NEGATIVE Final   C Diff interpretation   Final    Results are indeterminate. Please contact the provider listed for your campus for C diff questions in [place].    Comment: Performed at [facility], [address] [place], [place] [address]  C. Diff by PCR, Reflexed     Status: None   Collection Time: 12/13/20  9:40 AM  Result Value Ref Range Status    Toxigenic C. Difficile by PCR NEGATIVE NEGATIVE Final    Comment: Patient is colonized with non toxigenic C. difficile. May not need treatment unless significant symptoms are present. Performed at [facility], [address] [place], [place] [address]   MRSA PCR Screening     Status: None   Collection Time: 12/15/20  1:35 PM   Specimen: Nasal Mucosa; Nasopharyngeal  Result Value Ref Range Status   MRSA by PCR NEGATIVE NEGATIVE Final    Comment:        The GeneXpert MRSA Assay (FDA approved for NASAL specimens only), is one component of a comprehensive MRSA colonization surveillance program. It is not intended to diagnose MRSA infection nor to guide or monitor treatment for MRSA infections. Performed at [facility], [address], [place], [place] [address]          Radiology Studies: CT HEAD WO CONTRAST  Result Date: 12/16/2020 CLINICAL DATA:  Mental status change. EXAM: CT HEAD WITHOUT CONTRAST TECHNIQUE: Contiguous axial images were obtained from the base of the skull through the vertex without intravenous contrast. COMPARISON:  MRI head June 11th, 2012 FINDINGS: Brain: Abnormal hypoattenuation right frontal lobe, which appears to predominantly involve white matter. This finding is new relative to remote MRI from 2012. No substantial mass effect. No midline shift. No acute hemorrhage. No hydrocephalus. No extra-axial fluid collection. Partially empty and expanded sella. Vascular: Calcific atherosclerosis. No hyperdense vessel identified. Skull: No acute fracture. Sinuses/Orbits: Visualized sinuses are clear.  Unremarkable orbits. Other: No mastoid effusions. IMPRESSION: Abnormal hypoattenuation in the right  frontal lobe, which appears to predominantly involve the white matter. While this could relate to prior ischemia, findings are nonspecific by CT. Recommend MRI (with contrast if the patient is able) to further evaluate and exclude other etiologies. These  results will be called to the ordering clinician or representative by the Radiologist Assistant, and communication documented in the PACS or [employer]. Electronically Signed   By: Margaretha Sheffield MD   On: 12/16/2020 15:38   PERIPHERAL VASCULAR CATHETERIZATION  Result Date: 12/15/2020 See op note  DG Abd 2 Views  Result Date: 12/16/2020 CLINICAL DATA:  Ileus EXAM: ABDOMEN - 2 VIEW COMPARISON:  CT angiography of the abdomen and pelvis from December 14, 2020 FINDINGS: Peritoneal dialysis catheter projects over the RIGHT and central abdomen tracking towards the LEFT pelvis as on recent imaging. Signs of vascular disease and prior LEFT renal artery stenting as well as postoperative changes in the RIGHT upper quadrant as on the previous study. Since the prior exam a stent is been placed along the expected course of the SMA. Nonspecific bowel gas pattern, no overt signs of obstruction. Scattered gas-filled loops of bowel throughout the abdomen, relative paucity of gas. Anastomotic changes in the pelvis from prior colonic surgery. Decubitus view without signs of free air. On limited assessment no acute skeletal process. IMPRESSION: 1. Nonspecific bowel gas pattern without overt signs of obstruction or free air. 2. Peritoneal dialysis catheter remains in place. Electronically Signed   By: Zetta Bills M.D.   On: 12/16/2020 15:08        Scheduled Meds: . aspirin  81 mg Oral Daily  . Chlorhexidine Gluconate Cloth  6 each Topical Daily  . diclofenac Sodium  2 g Topical QID  . escitalopram  10 mg Oral Daily  . fentaNYL      . fluticasone furoate-vilanterol  1 puff Inhalation Daily   And  . umeclidinium bromide  1 puff Inhalation Daily  . gentamicin cream  1 application Topical Daily  . heparin  5,000 Units Subcutaneous Q8H  . insulin aspart  0-5 Units Subcutaneous QHS  . insulin aspart  0-6 Units Subcutaneous TID WC  . mirtazapine  7.5 mg Oral QHS  . pantoprazole (PROTONIX) IV  40 mg  Intravenous Q24H  . potassium chloride  20 mEq Oral Daily  . sodium phosphate  2 enema Rectal UD   Continuous Infusions: . sodium chloride Stopped (12/17/20 0357)  . sodium chloride 10 mL/hr at 12/17/20 1216  . dialysis solution 1.5% low-MG/low-CA    . lactated ringers 75 mL/hr at 12/17/20 1332     LOS: 5 days    Time spent: 36 minutes    Sharen Hones, MD [facility]   To contact the attending provider between 7A-7P or the covering provider during after hours 7P-7A, please log into the web site [email or web address] and access using universal [place] password for that web site. If you do not have the password, please call the hospital operator.  12/17/2020, 2:44 PM

## 2020-12-17 NOTE — Progress Notes (Signed)
[facility] GI inpatient brief Progress Note  Patient seen for persistent sepsis with hx of diarrhea. S/P SMA stent placement on 12/16/20.. Patient removed off Levophed this morning.   Vitals:   12/17/20 1030 12/17/20 1133  BP: 101/63   Pulse: 89 92  Resp: 14 16  Temp:  97.8 F (36.6 C)  SpO2: 98% 98%      Impression:  1. Diarrhea 2. Septic shock 3. Mesenteric ischemia s/p SMA stent   Plan:  1.Proceed with flexible sigmoidoscopy with biopsies. I spoke with patient husband,. Mr. Sahvannah Agu, who understands the nature of the planned procedure, indications, risks, alternatives and potential complications including but not limited to bleeding, infection, perforation, damage to internal organs and possible oversedation/side effects from anesthesia. Mr. Hugunin agrees and gives consent to proceed.  Please refer to procedure notes for findings, recommendations and patient disposition/instructions.   Thank you  T. Madolyn Frieze, M.D. ABIM Diplomate in Gastroenterology [place] [phone] - Cell

## 2020-12-17 NOTE — Anesthesia Preprocedure Evaluation (Signed)
Anesthesia Evaluation  Patient identified by MRN, date of birth, ID band Patient confused    Reviewed: Allergy & Precautions, NPO status , Patient's Chart, lab work & pertinent test results  Airway Mallampati: III  TM Distance: >3 FB Neck ROM: Full    Dental  (+) Edentulous Upper, Edentulous Lower   Pulmonary shortness of breath and at rest, COPD,  oxygen dependent, Current Smoker and Patient abstained from smoking.,    Pulmonary exam normal        Cardiovascular Exercise Tolerance: Poor hypertension, + Peripheral Vascular Disease  Normal cardiovascular exam     Neuro/Psych PSYCHIATRIC DISORDERS Depression    GI/Hepatic Neg liver ROS, GERD  ,barretts esophagus   Endo/Other  diabetes  Renal/GU ESRFRenal disease  negative genitourinary   Musculoskeletal   Abdominal   Peds  Hematology  (+) anemia ,   Anesthesia Other Findings 1. Diarrhea 2. Septic shock 3. Mesenteric ischemia s/p SMA stent   Anemia  Barrett's esophagus  Carotid stenosis, bilateral  Chronic kidney disease  GERD  Hyperlipidemia  Hyperparathyroidism (HCC)  Hypertension  Renal artery stenosis (HCC)    Reproductive/Obstetrics                             Anesthesia Physical  Anesthesia Plan  ASA: IV  Anesthesia Plan: General   Post-op Pain Management:    Induction: Intravenous  PONV Risk Score and Plan: Propofol infusion and TIVA  Airway Management Planned: Nasal Cannula and Natural Airway  Additional Equipment:   Intra-op Plan:   Post-operative Plan: Extubation in OR  Informed Consent: I have reviewed the patients History and Physical, chart, labs and discussed the procedure including the risks, benefits and alternatives for the proposed anesthesia with the patient or authorized representative who has indicated his/her understanding and acceptance.     Dental advisory given  Plan Discussed with: CRNA,  Surgeon and Anesthesiologist  Anesthesia Plan Comments:         Anesthesia Quick Evaluation

## 2020-12-17 NOTE — Progress Notes (Signed)
Date of Admission:  12/12/2020      ID: Anita Wells is a 80 y.o. female  Active Problems:   Type 2 diabetes mellitus with ESRD (end-stage renal disease) (HCC)   Colitis   Lactic acidosis   Acute pancreatitis without infection or necrosis   Hypotension   Acute metabolic encephalopathy   Shock ([place])   PVD (peripheral vascular disease) ([place])    Subjective: Pt is a bit more awake Not much verbal Husband at bedside  Medications:  . [MAR Hold] aspirin  81 mg Oral Daily  . [MAR Hold] Chlorhexidine Gluconate Cloth  6 each Topical Daily  . [MAR Hold] diclofenac Sodium  2 g Topical QID  . [MAR Hold] escitalopram  10 mg Oral Daily  . fentaNYL      . [MAR Hold] fluticasone furoate-vilanterol  1 puff Inhalation Daily   And  . [MAR Hold] umeclidinium bromide  1 puff Inhalation Daily  . [MAR Hold] gentamicin cream  1 application Topical Daily  . [MAR Hold] heparin  5,000 Units Subcutaneous Q8H  . [MAR Hold] insulin aspart  0-5 Units Subcutaneous QHS  . [MAR Hold] insulin aspart  0-6 Units Subcutaneous TID WC  . [MAR Hold] mirtazapine  7.5 mg Oral QHS  . [MAR Hold] pantoprazole (PROTONIX) IV  40 mg Intravenous Q24H  . [MAR Hold] potassium chloride  20 mEq Oral Daily  . [MAR Hold] sodium phosphate  2 enema Rectal UD  . [MAR Hold] vancomycin  125 mg Oral Q6H    Objective: Vital signs in last 24 hours: Temp:  [96.8 F (36 C)-97.8 F (36.6 C)] 97.8 F (36.6 C) (02/23 1133) Pulse Rate:  [82-109] 92 (02/23 1133) Resp:  [14-28] 16 (02/23 1133) BP: (81-143)/(45-82) 101/63 (02/23 1030) SpO2:  [94 %-100 %] 98 % (02/23 1133) Weight:  [60.1 kg] 60.1 kg (02/23 0308)  PHYSICAL EXAM:  General: awake Less verbal Lungs: b/la ir entry Heart: s1s2 Abdomen: tender .PD catheter  Extremities: some edema Neurologic: cannot be assessed Lab Results Recent Labs    12/16/20 0408 12/17/20 0003 12/17/20 0705  WBC 33.1*  --  34.3*  HGB 9.7*  --  9.6*  HCT 29.0*  --  29.0*  NA 131*  --   130*  K 3.4* 3.4* 3.8  CL 98  --  96*  CO2 21*  --  21*  BUN 42*  --  39*  CREATININE 7.18*  --  6.22*   Liver Panel Recent Labs    12/17/20 0705  ALBUMIN 1.5*   Sedimentation Rate No results for input(s): ESRSEDRATE in the last 72 hours. C-Reactive Protein No results for input(s): CRP in the last 72 hours.  Microbiology:  Studies/Results: CT HEAD WO CONTRAST  Result Date: 12/16/2020 CLINICAL DATA:  Mental status change. EXAM: CT HEAD WITHOUT CONTRAST TECHNIQUE: Contiguous axial images were obtained from the base of the skull through the vertex without intravenous contrast. COMPARISON:  MRI head June 11th, 2012 FINDINGS: Brain: Abnormal hypoattenuation right frontal lobe, which appears to predominantly involve white matter. This finding is new relative to remote MRI from 2012. No substantial mass effect. No midline shift. No acute hemorrhage. No hydrocephalus. No extra-axial fluid collection. Partially empty and expanded sella. Vascular: Calcific atherosclerosis. No hyperdense vessel identified. Skull: No acute fracture. Sinuses/Orbits: Visualized sinuses are clear.  Unremarkable orbits. Other: No mastoid effusions. IMPRESSION: Abnormal hypoattenuation in the right frontal lobe, which appears to predominantly involve the white matter. While this could relate to prior ischemia,  findings are nonspecific by CT. Recommend MRI (with contrast if the patient is able) to further evaluate and exclude other etiologies. These results will be called to the ordering clinician or representative by the Radiologist Assistant, and communication documented in the PACS or [employer]. Electronically Signed   By: Margaretha Sheffield MD   On: 12/16/2020 15:38   PERIPHERAL VASCULAR CATHETERIZATION  Result Date: 12/15/2020 See op note  DG Abd 2 Views  Result Date: 12/16/2020 CLINICAL DATA:  Ileus EXAM: ABDOMEN - 2 VIEW COMPARISON:  CT angiography of the abdomen and pelvis from December 14, 2020 FINDINGS:  Peritoneal dialysis catheter projects over the RIGHT and central abdomen tracking towards the LEFT pelvis as on recent imaging. Signs of vascular disease and prior LEFT renal artery stenting as well as postoperative changes in the RIGHT upper quadrant as on the previous study. Since the prior exam a stent is been placed along the expected course of the SMA. Nonspecific bowel gas pattern, no overt signs of obstruction. Scattered gas-filled loops of bowel throughout the abdomen, relative paucity of gas. Anastomotic changes in the pelvis from prior colonic surgery. Decubitus view without signs of free air. On limited assessment no acute skeletal process. IMPRESSION: 1. Nonspecific bowel gas pattern without overt signs of obstruction or free air. 2. Peritoneal dialysis catheter remains in place. Electronically Signed   By: Zetta Bills M.D.   On: 12/16/2020 15:08     Assessment/Plan:  Abdominal pain,diarrhea, leucocytosis Mesenteric ischemia VS infection Being treated as cdiff eventhough stool test is neg ( no toxin PCR neg, antigen positive) No response to Vancomycin Had Sigmoidoscopy today and it showed ulcerative lesions in the sigmoid concerning for severe ischemia- no evidence of pseudomembrane to suggest cdiff So her diagnosis is ischemic colitis and ischemic SI Had a SMA stent placement yesterday Currently on zosyn- will change that to unasyn DC Po vancomycin  Leucocytosis due to ischemic colitis  Hypovolemic shock due to bowel ischemia- off pressors  Husband interested in talking to palliative Discussed the management with care team

## 2020-12-17 NOTE — Transfer of Care (Signed)
Immediate Anesthesia Transfer of Care Note  Patient: Anita Wells  Procedure(s) Performed: FLEXIBLE SIGMOIDOSCOPY (N/A )  Patient Location: PACU  Anesthesia Type:General  Level of Consciousness: awake  Airway & Oxygen Therapy: Patient Spontanous Breathing and Patient connected to nasal cannula oxygen  Post-op Assessment: Report given to RN and Post -op Vital signs reviewed and stable  Post vital signs: Reviewed and stable  Last Vitals:  Vitals Value Taken Time  BP 129/79 12/17/20 1235  Temp 36.8 C 12/17/20 1235  Pulse 90 12/17/20 1236  Resp 19 12/17/20 1236  SpO2 97 % 12/17/20 1236  Vitals shown include unvalidated device data.  Last Pain:  Vitals:   12/17/20 1235  TempSrc: Temporal  PainSc:       Patients Stated Pain Goal: 0 [phone])  Complications: No complications documented.

## 2020-12-17 NOTE — Progress Notes (Signed)
PD treatment started without any issues or concerns at this time. Treatment:  Time 10hrs. Fill volume of 2057m x 4 exchanges 042mlast fill volume, use of 1.5% dialysate 6L x 2 bags used for this treatment.  Exit site care completed during this visit with no issues or concerns noted with exit site. Exit site classified as good. Cleansed with ExSept and Gentamicin cream applied, covered with sterile guaze and tape.

## 2020-12-17 NOTE — Interval H&P Note (Signed)
History and Physical Interval Note:  12/17/2020 11:49 AM  Anita Wells  has presented today for surgery, with the diagnosis of Acute colitis, diffuse abdominal pain, severe leukocytosis.  The various methods of treatment have been discussed with the patient and family. After consideration of risks, benefits and other options for treatment, the patient has consented to  Procedure(s): FLEXIBLE SIGMOIDOSCOPY (N/A) as a surgical intervention.  The patient's history has been reviewed, patient examined, no change in status, stable for surgery.  I have reviewed the patient's chart and labs.  Questions were answered to the patient's satisfaction.     [place], [place]

## 2020-12-17 NOTE — Progress Notes (Signed)
Pt to endoscopy for scope by transport team. On tele. Levophed off for approx 2 hours, VSS.

## 2020-12-17 NOTE — Progress Notes (Signed)
SLP Cancellation Note  Patient Details Name: Anita Wells MRN: [id number] DOB: 20-Mar-1941   Cancelled treatment:       Reason Eval/Treat Not Completed: Patient at procedure or test/unavailable (chart reviewed; attempted earlier today). Pt was out of room at procedure; she had a Sigmoidoscopy performed today. ST services will f/u w/ pt's BSE tomorrow morning in hopes to initiate an oral diet; pt remains NPO at this time. Recommend frequent oral care for hygiene and stimulation of swallowing.     Orinda Kenner, MS, CCC-SLP Speech Language Pathologist Rehab Services [phone] [facility] 12/17/2020, 4:52 PM

## 2020-12-17 NOTE — Op Note (Signed)
[facility] Patient Name: Anita Wells Procedure Date: 12/17/2020 11:51 AM MRN: [id number] Account #: [id number] Date of Birth: Mar 17, 1941 Admit Type: Inpatient Age: 80 Room: [facility] ENDO ROOM 2 Gender: Female Note Status: Finalized Procedure:             Flexible Sigmoidoscopy Indications:           Diarrhea, Sepsis Providers:             Benay Pike. Amorie Rentz MD, MD Medicines:             Propofol per Anesthesia Complications:         No immediate complications. Estimated blood loss: None. Procedure:             Pre-Anesthesia Assessment:                        - The risks and benefits of the procedure and the                         sedation options and risks were discussed with the                         patient. All questions were answered and informed                         consent was obtained.                        - Patient identification and proposed procedure were                         verified prior to the procedure by the nurse. The                         procedure was verified in the procedure room.                        - ASA Grade Assessment: IV - A patient with severe                         systemic disease that is a constant threat to life.                        - After reviewing the risks and benefits, the patient                         was deemed in satisfactory condition to undergo the                         procedure.                        After obtaining informed consent, the scope was passed                         under direct vision. The Endoscope was introduced                         through the anus and advanced to the the sigmoid  colon. The flexible sigmoidoscopy was somewhat                         difficult due to poor endoscopic visualization.                         Successful completion of the procedure was aided by                         lavage. The quality of the bowel preparation  was                         unprepped, inadequate but is being performed for                         diagnostic purposes only. Findings:      The perianal and digital rectal examinations were normal. Pertinent       negatives include normal sphincter tone and no palpable rectal lesions.      Normal mucosa was found in the rectum. Biopsies were taken with a cold       forceps for histology.      A continuous area of nonbleeding ulcerated mucosa with no stigmata of       recent bleeding was present in the sigmoid colon. Biopsies were taken       with a cold forceps for histology. Biopsies were taken with a cold       forceps for histology.      The procedure was somewhat difficult due to inadequate bowel prep.      The heart rate, respiratory rate, oxygen saturations, blood pressure,       adequacy of pulmonary ventilation, and response to care were monitored       throughout the procedure. Impression:            - Normal mucosa in the rectum. Biopsied.                        - Mucosal ulceration in the sigmoid colon concerning                         for severe ischemia. No gross evidence of C Dif                         colitis. Biopsied. Recommendation:        - Return to ICU for ongoing care. Await pathology. Procedure Code(s):     --- Professional ---                        [phone], Sigmoidoscopy, flexible; with biopsy, single or                         multiple Diagnosis Code(s):     --- Professional ---                        R19.7, Diarrhea, unspecified                        K63.3, Ulcer of intestine CPT copyright 2019 American Medical Association. All rights reserved. The codes documented in this report are preliminary  and upon coder review may  be revised to meet current compliance requirements. Efrain Sella MD, MD 12/17/2020 12:44:16 PM This report has been signed electronically. Number of Addenda: 0 Note Initiated On: 12/17/2020 11:51 AM Estimated Blood Loss:  Estimated  blood loss: none.      [facility]

## 2020-12-17 NOTE — Progress Notes (Signed)
Pharmacy Antibiotic Note  Anita Wells is a 80 y.o. female with ESRD on peritoneal dialysis, PVD. She had a recent hospitalization for acute colitis from 12/05/2020 to 12/08/2020 and was sent home on Augmentin but still had pain as outpatient and was readmitted on 12/12/2020 with colitis. The patient had worsening mental status and became hypotensive and was transferred to the ICU on 12/15/2020 for Levophed drip.  Dr. Lucky Cowboy took to the vascular lab and did an angioplasty on the SMA lesion. She has been on IV antibiotics since admission (as below) and being followed by the ID service. Pharmacy has been consulted for Unasyn dosing.  Plan:  Start Unasyn 3 grams IV every 24 hours  Height: '5\' 6"'$  (167.6 cm) Weight: 60.1 kg (132 lb 7.9 oz) IBW/kg (Calculated) : 59.3  Temp (24hrs), Avg:97.6 F (36.4 C), Min:96.8 F (36 C), Max:98.3 F (36.8 C)  Recent Labs  Lab 12/12/20 1343 12/12/20 1350 12/12/20 1644 12/13/20 0427 12/13/20 0428 12/14/20 0458 12/15/20 0735 12/15/20 1224 12/16/20 0408 12/17/20 0705  WBC  --   --   --  52.3* 52.3* 38.6* 29.4*  --  33.1* 34.3*  CREATININE  --    < >  --  7.95*  --  7.57* 7.03*  --  7.18* 6.22*  LATICACIDVEN 5.6*  --  3.0*  --   --  3.7*  --  2.1* 1.1  --    < > = values in this interval not displayed.    Estimated Creatinine Clearance: 6.6 mL/min (A) (by C-G formula based on SCr of 6.22 mg/dL (H)).    Allergies  Allergen Reactions  . Clopidogrel Swelling    Mouth swelling  . Ativan [Lorazepam] Other (See Comments)    Hypersensitivity, very very sensitive to ativan doses  . Ace Inhibitors Cough  . Amlodipine Other (See Comments)    Peripheral edema (tolerates low dose)    Antimicrobials this admission: ceftriaxone 2/18 >> 2/19 Metronidazole  2/18 >> 2/20 Zosyn 2/20 >> 2/23 Unasyn 2/23 >>  Microbiology results: 02/18 BCx: NG final  02/23 PD fluid Cx: pending 02/21 MRSA PCR: negative 02/19 GI panel: adenovirus F40/41 02/19 C diff Ag (+), toxin  (-)  Thank you for allowing pharmacy to be a part of this patient's care.  Dallie Piles 12/17/2020 2:58 PM

## 2020-12-17 NOTE — Anesthesia Postprocedure Evaluation (Signed)
Anesthesia Post Note  Patient: Anita Wells  Procedure(s) Performed: FLEXIBLE SIGMOIDOSCOPY (N/A )  Patient location during evaluation: Phase II Anesthesia Type: General Level of consciousness: awake and alert, awake and oriented Pain management: pain level controlled Vital Signs Assessment: post-procedure vital signs reviewed and stable Respiratory status: spontaneous breathing, nonlabored ventilation and respiratory function stable Cardiovascular status: blood pressure returned to baseline and stable Postop Assessment: no apparent nausea or vomiting Anesthetic complications: no   No complications documented.   Last Vitals:  Vitals:   12/17/20 1235 12/17/20 1315  BP: 129/79 112/66  Pulse:  91  Resp:  19  Temp: 36.8 C 36.6 C  SpO2:  100%    Last Pain:  Vitals:   12/17/20 1315  TempSrc: Oral  PainSc:                  Phill Mutter

## 2020-12-17 NOTE — Care Management (Signed)
Received call from [name] at the [employer].  She confirmed that Advice worker has received notification, and that the red cross would continue to assist through patient's daughter Anita Wells

## 2020-12-18 ENCOUNTER — Encounter: Payer: Self-pay | Admitting: Internal Medicine

## 2020-12-18 DIAGNOSIS — Z7189 Other specified counseling: Secondary | ICD-10-CM

## 2020-12-18 DIAGNOSIS — Z66 Do not resuscitate: Secondary | ICD-10-CM

## 2020-12-18 DIAGNOSIS — K559 Vascular disorder of intestine, unspecified: Secondary | ICD-10-CM | POA: Diagnosis not present

## 2020-12-18 DIAGNOSIS — G9341 Metabolic encephalopathy: Secondary | ICD-10-CM | POA: Diagnosis not present

## 2020-12-18 DIAGNOSIS — Z515 Encounter for palliative care: Secondary | ICD-10-CM | POA: Diagnosis not present

## 2020-12-18 DIAGNOSIS — R579 Shock, unspecified: Secondary | ICD-10-CM | POA: Diagnosis not present

## 2020-12-18 LAB — RENAL FUNCTION PANEL
Albumin: 1.3 g/dL — ABNORMAL LOW (ref 3.5–5.0)
Anion gap: 13 (ref 5–15)
BUN: 35 mg/dL — ABNORMAL HIGH (ref 8–23)
CO2: 21 mmol/L — ABNORMAL LOW (ref 22–32)
Calcium: 6.9 mg/dL — ABNORMAL LOW (ref 8.9–10.3)
Chloride: 97 mmol/L — ABNORMAL LOW (ref 98–111)
Creatinine, Ser: 5.43 mg/dL — ABNORMAL HIGH (ref 0.44–1.00)
GFR, Estimated: 7 mL/min — ABNORMAL LOW (ref >60)
Glucose, Bld: 160 mg/dL — ABNORMAL HIGH (ref 70–99)
Phosphorus: 2.5 mg/dL (ref 2.5–4.6)
Potassium: 2.9 mmol/L — ABNORMAL LOW (ref 3.5–5.1)
Sodium: 131 mmol/L — ABNORMAL LOW (ref 135–145)

## 2020-12-18 LAB — CBC
HCT: 29 % — ABNORMAL LOW (ref 36.0–46.0)
Hemoglobin: 9.6 g/dL — ABNORMAL LOW (ref 12.0–15.0)
MCH: 29.7 pg (ref 26.0–34.0)
MCHC: 33.1 g/dL (ref 30.0–36.0)
MCV: 89.8 fL (ref 80.0–100.0)
Platelets: 108 10*3/uL — ABNORMAL LOW (ref 150–400)
RBC: 3.23 MIL/uL — ABNORMAL LOW (ref 3.87–5.11)
RDW: 20.2 % — ABNORMAL HIGH (ref 11.5–15.5)
WBC: 18.8 10*3/uL — ABNORMAL HIGH (ref 4.0–10.5)
nRBC: 0.1 % (ref 0.0–0.2)

## 2020-12-18 LAB — MAGNESIUM: Magnesium: 2.1 mg/dL (ref 1.7–2.4)

## 2020-12-18 LAB — GLUCOSE, CAPILLARY: Glucose-Capillary: 143 mg/dL — ABNORMAL HIGH (ref 70–99)

## 2020-12-18 LAB — SURGICAL PATHOLOGY

## 2020-12-18 MED ORDER — GLYCOPYRROLATE 0.2 MG/ML IJ SOLN: 0.2000 mg | INTRAMUSCULAR | Status: DC | PRN

## 2020-12-18 MED ORDER — MORPHINE SULFATE (CONCENTRATE) 10 MG/0.5ML PO SOLN
5.0000 mg | ORAL | Status: DC | PRN
Start: 2020-12-18 — End: 2020-12-19
  Filled 2020-12-18 (×2): qty 0.5

## 2020-12-18 MED ORDER — HALOPERIDOL 0.5 MG PO TABS
0.5000 mg | ORAL_TABLET | ORAL | Status: DC | PRN
  Filled 2020-12-18: qty 1

## 2020-12-18 MED ORDER — ALBUMIN HUMAN 25 % IV SOLN
50.0000 g | Freq: Once | INTRAVENOUS | Status: AC
  Administered 2020-12-18: 50 g via INTRAVENOUS
  Filled 2020-12-18: qty 200

## 2020-12-18 MED ORDER — ACETAMINOPHEN 650 MG RE SUPP: 650.0000 mg | Freq: Four times a day (QID) | RECTAL | Status: DC | PRN

## 2020-12-18 MED ORDER — ONDANSETRON 4 MG PO TBDP
4.0000 mg | ORAL_TABLET | Freq: Four times a day (QID) | ORAL | Status: DC | PRN
  Filled 2020-12-18: qty 1

## 2020-12-18 MED ORDER — HALOPERIDOL LACTATE 5 MG/ML IJ SOLN: 0.5000 mg | INTRAMUSCULAR | Status: DC | PRN

## 2020-12-18 MED ORDER — HYDROCORTISONE NA SUCCINATE PF 100 MG IJ SOLR
100.0000 mg | Freq: Once | INTRAMUSCULAR | Status: AC
  Administered 2020-12-18: 100 mg via INTRAVENOUS
  Filled 2020-12-18: qty 2

## 2020-12-18 MED ORDER — MORPHINE SULFATE (CONCENTRATE) 10 MG/0.5ML PO SOLN
5.0000 mg | ORAL | Status: DC | PRN
  Administered 2020-12-18 – 2020-12-19 (×4): 5 mg via SUBLINGUAL
  Filled 2020-12-18 (×2): qty 0.5

## 2020-12-18 MED ORDER — NOREPINEPHRINE 4 MG/250ML-% IV SOLN: 2.0000 ug/min | INTRAVENOUS | Status: DC

## 2020-12-18 MED ORDER — NOREPINEPHRINE 4 MG/250ML-% IV SOLN
INTRAVENOUS | Status: AC
  Administered 2020-12-18: 2 ug/min via INTRAVENOUS
  Filled 2020-12-18: qty 250

## 2020-12-18 MED ORDER — SODIUM CHLORIDE 0.9 % IV SOLN: 250.0000 mL | INTRAVENOUS | Status: DC

## 2020-12-18 MED ORDER — ACETAMINOPHEN 325 MG PO TABS: 650.0000 mg | ORAL_TABLET | Freq: Four times a day (QID) | ORAL | Status: DC | PRN

## 2020-12-18 MED ORDER — POTASSIUM CHLORIDE 10 MEQ/100ML IV SOLN
10.0000 meq | INTRAVENOUS | Status: DC
  Filled 2020-12-18 (×2): qty 100

## 2020-12-18 MED ORDER — ONDANSETRON HCL 4 MG/2ML IJ SOLN
4.0000 mg | Freq: Four times a day (QID) | INTRAMUSCULAR | Status: DC | PRN
  Administered 2020-12-19: 4 mg via INTRAVENOUS
  Filled 2020-12-18: qty 2

## 2020-12-18 MED ORDER — GLYCOPYRROLATE 1 MG PO TABS
1.0000 mg | ORAL_TABLET | ORAL | Status: DC | PRN
  Filled 2020-12-18: qty 1

## 2020-12-18 MED ORDER — HALOPERIDOL LACTATE 2 MG/ML PO CONC
0.5000 mg | ORAL | Status: DC | PRN
  Filled 2020-12-18: qty 0.3

## 2020-12-18 NOTE — TOC Progression Note (Addendum)
Transition of Care [facility]) - Progression Note    Patient Details  Name: Anita Wells MRN: [id number] Date of Birth: April 25, 1941  Transition of Care [facility] CM/SW [place], [place] Phone Number:  [phone] 12/18/2020, 11:03 AM  Clinical Narrative:     Patient transitioning to comfort measures.  Update: [facility] will provide hospice services at home, and set up DME.        Expected Discharge Plan and Services                                                 Social Determinants of Health (SDOH) Interventions    Readmission Risk Interventions Readmission Risk Prevention Plan 12/13/2019  Transportation Screening Complete  Medication Review Press photographer) Complete  PCP or Specialist appointment within 3-5 days of discharge Complete  HRI or Home Care Consult Complete  [place] Not Applicable  Some recent data might be hidden

## 2020-12-18 NOTE — Progress Notes (Signed)
[facility] GI inpatient brief Progress Note  Patient seen for f/u severe colitis. Patient with recurrent hypotension and need for pressors today.  Vitals:   12/18/20 1136 12/18/20 1200  BP: 129/73   Pulse:    Resp: 13 12  Temp: (!) 97.4 F (36.3 C)   SpO2: 100% 100%   WBC 18k   Impression:  1. Colitis - Favor ischemia 2. Septic shock 3. Lactic acidosis   Plan:  1. Palliative care consult has been called for comfort care measures 2. Continue supportive care.   Will sign off for now  Please call back if we can help.  Thank you  T. Madolyn Frieze, M.D. ABIM Diplomate in Gastroenterology [place] [phone] - Cell

## 2020-12-18 NOTE — Progress Notes (Addendum)
PROGRESS NOTE    Anita Wells  [id number] DOB: 12-18-1940 DOA: 12/12/2020 PCP: Dion Body, MD   Chief complaint.  Abdominal pain. Brief Narrative:  Patient with end-stage renal disease on peritoneal dialysis,peripheral vascular disease. Patient had a recent hospitalization for acute colitis from 12/05/2020 to 12/08/2020 and was sent home on Augmentin. Patient still had pain as outpatient and came back to the hospital and readmitted on 12/12/2020.Marland Kitchen Patient had initial CT scan that was consistent with colitis. We repeated a CT angio that did show blockage of the celiac artery and blockages in the SMA and IMA. The patient had worsening mental status and became hypotensive and was transferred to the ICU on 12/15/2020 for Levophed drip. Dr. Lucky Cowboy took to the vascular lab and did an angioplasty on the SMA lesion.  Sigmoidoscopy was performed on 2/23, reviewed diffuse ulceration in the colon consistent with severe ischemia. 2/24.  Patient developed significant hypotension this morning, given 50 g albumin as well as Solu-Cortef.  Patient will be seen by palliative care today, consider comfort care.   Assessment & Plan:   Active Problems:   Type 2 diabetes mellitus with ESRD (end-stage renal disease) (HCC)   Colitis   Lactic acidosis   Acute pancreatitis without infection or necrosis   Hypotension   Acute metabolic encephalopathy   Shock ([place])   PVD (peripheral vascular disease) (HCC)   Ischemia, bowel ([place])  #1.  Hypovolemic shock secondary to bowel ischemia. Bowel ischemia status post SMA angioplasty. Leukemoid reaction secondary to bowel ischemia. Patient developed hypotension again today, briefly put on vasopressor, also giving a dose of Solu-Cortef, 50 g albumin. Patient still has significant abdominal distention, no bowel movement except liquid from prior enema. Patient still has not received any nutrition. Sigmoidoscopy showed severe bowel ischemia, in my opinion, patient  has no chance to recover.   Case has been discussed with Dr.Cintron-Daz, patient will need a total colectomy, but patient will not be able to tolerate surgery. I discussed patient condition with daughter again, she had a family meeting last night, everybody in agreement for patient to transition to home hospice.  They prefer patient to go home.  Change CODE STATUS to DO NOT RESUSCITATE. Initiate comfort care.   #2.  End-stage renal disease on peritoneal dialysis. Hyponatremia. As above  3.  Uncontrolled type 2 diabetes with hyperglycemia. No need for insulin.  4.  C. difficile colonization.  5.  COPD.    DVT prophylaxis: Heparin Code Status: Full Family Communication:  Disposition Plan:  .   Status is: Inpatient  Remains inpatient appropriate because:Inpatient level of care appropriate due to severity of illness   Dispo: The patient is from: Home              Anticipated d/c is to: Uncertain              Anticipated d/c date is: 3 days              Patient currently is not medically stable to d/c.   Difficult to place patient No        I/O last 3 completed shifts: In: 2785.2 [I.V.:2228.4; IV Piggyback:556.8] Out: -  No intake/output data recorded.     Consultants:   Palliative care  Procedures: None  Antimicrobials:   Subjective: Patient has some confusion and sleepiness.  Still has some pain in the stomach, with abdominal distention.  No bowel movement. Does not seem to have any short of breath today. No fever or  chills  Objective: Vitals:   12/18/20 0500 12/18/20 0700 12/18/20 0800 12/18/20 0805  BP: (!) 107/58 (!) 97/50 (!) 83/48 (!) 87/50  Pulse:      Resp: '18 13 12 14  '$ Temp: (!) 97.1 F (36.2 C)  (!) 97.1 F (36.2 C)   TempSrc: Axillary  Axillary   SpO2:   100%   Weight:      Height:        Intake/Output Summary (Last 24 hours) at 12/18/2020 1004 Last data filed at 12/18/2020 0300 Gross per 24 hour  Intake 1636.33 ml  Output --  Net  1636.33 ml   Filed Weights   12/16/20 0500 12/17/20 0308 12/18/20 0455  Weight: 56 kg 60.1 kg 61.6 kg    Examination:  General exam: Appears calm and ill appearing Respiratory system: Clear to auscultation. Respiratory effort normal. Cardiovascular system: S1 & S2 heard, RRR. No JVD, murmurs, rubs, gallops or clicks. No pedal edema. Gastrointestinal system: Abdomen is distended, soft and diffuse tender. No organomegaly or masses felt. Normal bowel sounds heard. Central nervous system: Drowsy and oriented x2. No focal neurological deficits. Extremities: Symmetric 5 x 5 power. Skin: No rashes, lesions or ulcers      Data Reviewed: I have personally reviewed following labs and imaging studies  CBC: Recent Labs  Lab 12/13/20 0428 12/14/20 0458 12/15/20 0735 12/16/20 0408 12/17/20 0705 12/18/20 0710  WBC 52.3* 38.6* 29.4* 33.1* 34.3* 18.8*  NEUTROABS 48.8*  --  26.9* 30.8*  --   --   HGB 12.1 11.1* 10.3* 9.7* 9.6* 9.6*  HCT 39.1 34.1* 31.9* 29.0* 29.0* 29.0*  MCV 95.6 92.2 92.2 90.9 90.6 89.8  PLT 206 183 145* 132* 132* 123XX123*   Basic Metabolic Panel: Recent Labs  Lab 12/12/20 1350 12/13/20 0427 12/14/20 0458 12/15/20 0735 12/16/20 0408 12/17/20 0003 12/17/20 0705 12/18/20 0710  NA 130*   < > 129* 129* 131*  --  130* 131*  K 3.4*   < > 3.0* 2.9* 3.4* 3.4* 3.8 2.9*  CL 89*   < > 93* 95* 98  --  96* 97*  CO2 20*   < > 20* 21* 21*  --  21* 21*  GLUCOSE 136*   < > 212* 211* 105*  --  237* 160*  BUN 33*   < > 37* 38* 42*  --  39* 35*  CREATININE 9.12*   < > 7.57* 7.03* 7.18*  --  6.22* 5.43*  CALCIUM 7.7*   < > 7.9* 7.3* 6.9*  --  7.2* 6.9*  MG 1.6*  --   --   --   --  1.0* 2.6* 2.1  PHOS  --   --   --   --   --   --  3.0 2.5   < > = values in this interval not displayed.   GFR: Estimated Creatinine Clearance: 7.6 mL/min (A) (by C-G formula based on SCr of 5.43 mg/dL (H)). Liver Function Tests: Recent Labs  Lab 12/12/20 1350 12/13/20 0427 12/17/20 0705  12/18/20 0710  AST 29 24  --   --   ALT 20 17  --   --   ALKPHOS 128* 107  --   --   BILITOT 0.4 0.5  --   --   PROT 5.3* 4.9*  --   --   ALBUMIN 2.0* 1.6* 1.5* 1.3*   Recent Labs  Lab 12/12/20 1350 12/13/20 0427 12/14/20 0458 12/15/20 0735  LIPASE 836* 924* 257* 86*  No results for input(s): AMMONIA in the last 168 hours. Coagulation Profile: No results for input(s): INR, PROTIME in the last 168 hours. Cardiac Enzymes: No results for input(s): CKTOTAL, CKMB, CKMBINDEX, TROPONINI in the last 168 hours. BNP (last 3 results) No results for input(s): PROBNP in the last 8760 hours. HbA1C: No results for input(s): HGBA1C in the last 72 hours. CBG: Recent Labs  Lab 12/17/20 1212 12/17/20 1337 12/17/20 1702 12/17/20 2118 12/18/20 0738  GLUCAP 169* 154* 119* 158* 143*   Lipid Profile: No results for input(s): CHOL, HDL, LDLCALC, TRIG, CHOLHDL, LDLDIRECT in the last 72 hours. Thyroid Function Tests: No results for input(s): TSH, T4TOTAL, FREET4, T3FREE, THYROIDAB in the last 72 hours. Anemia Panel: No results for input(s): VITAMINB12, FOLATE, FERRITIN, TIBC, IRON, RETICCTPCT in the last 72 hours. Sepsis Labs: Recent Labs  Lab 12/12/20 1644 12/14/20 0458 12/15/20 1224 12/16/20 0408  LATICACIDVEN 3.0* 3.7* 2.1* 1.1    Recent Results (from the past 240 hour(s))  Culture, blood (routine x 2)     Status: None   Collection Time: 12/12/20  1:50 PM   Specimen: BLOOD  Result Value Ref Range Status   Specimen Description BLOOD BLOOD RIGHT ARM  Final   Special Requests   Final    BOTTLES DRAWN AEROBIC AND ANAEROBIC Blood Culture adequate volume   Culture   Final    NO GROWTH 5 DAYS Performed at [facility], [address] [place], [place] [address]    Report Status 12/17/2020 FINAL  Final  SARS CORONAVIRUS 2 (TAT 6-24 HRS) Nasopharyngeal Nasopharyngeal Swab     Status: None   Collection Time: 12/12/20  2:30 PM   Specimen: Nasopharyngeal Swab  Result Value  Ref Range Status   SARS Coronavirus 2 NEGATIVE NEGATIVE Final    Comment: (NOTE) SARS-CoV-2 target nucleic acids are NOT DETECTED.  The SARS-CoV-2 RNA is generally detectable in upper and lower respiratory specimens during the acute phase of infection. Negative results do not preclude SARS-CoV-2 infection, do not rule out co-infections with other pathogens, and should not be used as the sole basis for treatment or other patient management decisions. Negative results must be combined with clinical observations, patient history, and epidemiological information. The expected result is Negative.  Fact Sheet for Patients: SugarRoll.be  Fact Sheet for Healthcare Providers: https://www.woods-mathews.com/  This test is not yet approved or cleared by the [place] FDA and  has been authorized for detection and/or diagnosis of SARS-CoV-2 by FDA under an Emergency Use Authorization (EUA). This EUA will remain  in effect (meaning this test can be used) for the duration of the COVID-19 declaration under Se ction 564(b)(1) of the Act, 21 U.S.C. section 360bbb-3(b)(1), unless the authorization is terminated or revoked sooner.  Performed at [facility], [place] [address] [place], [place] [address]   Culture, blood (routine x 2)     Status: None   Collection Time: 12/12/20  2:56 PM   Specimen: BLOOD  Result Value Ref Range Status   Specimen Description BLOOD BLOOD RIGHT FOREARM  Final   Special Requests   Final    BOTTLES DRAWN AEROBIC AND ANAEROBIC Blood Culture adequate volume   Culture   Final    NO GROWTH 5 DAYS Performed at [facility], [address] [place], [place] [address]    Report Status 12/17/2020 FINAL  Final  Gastrointestinal Panel by PCR , Stool     Status: Abnormal   Collection Time: 12/13/20  9:40 AM  Specimen: Stool  Result Value Ref Range Status   Campylobacter species NOT DETECTED NOT DETECTED Final    Plesimonas shigelloides NOT DETECTED NOT DETECTED Final   Salmonella species NOT DETECTED NOT DETECTED Final   Yersinia enterocolitica NOT DETECTED NOT DETECTED Final   Vibrio species NOT DETECTED NOT DETECTED Final   Vibrio cholerae NOT DETECTED NOT DETECTED Final   Enteroaggregative E coli (EAEC) NOT DETECTED NOT DETECTED Final   Enteropathogenic E coli (EPEC) NOT DETECTED NOT DETECTED Final   Enterotoxigenic E coli (ETEC) NOT DETECTED NOT DETECTED Final   Shiga like toxin producing E coli (STEC) NOT DETECTED NOT DETECTED Final   Shigella/Enteroinvasive E coli (EIEC) NOT DETECTED NOT DETECTED Final   Cryptosporidium NOT DETECTED NOT DETECTED Final   Cyclospora cayetanensis NOT DETECTED NOT DETECTED Final   Entamoeba histolytica NOT DETECTED NOT DETECTED Final   Giardia lamblia NOT DETECTED NOT DETECTED Final   Adenovirus F40/41 DETECTED (A) NOT DETECTED Final   Astrovirus NOT DETECTED NOT DETECTED Final   Norovirus GI/GII NOT DETECTED NOT DETECTED Final   Rotavirus A NOT DETECTED NOT DETECTED Final   Sapovirus (I, II, IV, and V) NOT DETECTED NOT DETECTED Final    Comment: Performed at [facility], [address] [place], [place] [address]  C Difficile Quick Screen (NO PCR Reflex)     Status: Abnormal   Collection Time: 12/13/20  9:40 AM   Specimen: STOOL  Result Value Ref Range Status   C Diff antigen POSITIVE (A) NEGATIVE Final   C Diff toxin NEGATIVE NEGATIVE Final   C Diff interpretation   Final    Results are indeterminate. Please contact the provider listed for your campus for C diff questions in [place].    Comment: Performed at [facility], [address] [place], [place] [address]  C. Diff by PCR, Reflexed     Status: None   Collection Time: 12/13/20  9:40 AM  Result Value Ref Range Status   Toxigenic C. Difficile by PCR NEGATIVE NEGATIVE Final    Comment: Patient is colonized with non toxigenic C. difficile. May not need treatment unless significant  symptoms are present. Performed at [facility], [address] [place], [place] [address]   MRSA PCR Screening     Status: None   Collection Time: 12/15/20  1:35 PM   Specimen: Nasal Mucosa; Nasopharyngeal  Result Value Ref Range Status   MRSA by PCR NEGATIVE NEGATIVE Final    Comment:        The GeneXpert MRSA Assay (FDA approved for NASAL specimens only), is one component of a comprehensive MRSA colonization surveillance program. It is not intended to diagnose MRSA infection nor to guide or monitor treatment for MRSA infections. Performed at [facility], [address] [place], [place] [address]   Body fluid culture w Gram Stain     Status: None (Preliminary result)   Collection Time: 12/17/20 10:05 AM   Specimen: Body Fluid  Result Value Ref Range Status   Specimen Description   Final    FLUID PERITONEAL DIALYSIS Performed at [facility], [address] [place], [place] [address]    Special Requests   Final    Normal Performed at [facility], [address] [place], [place] [address]    Gram Stain   Final    MODERATE WBC PRESENT,BOTH PMN AND MONONUCLEAR NO ORGANISMS SEEN    Culture   Final    NO GROWTH < 24 HOURS  Performed at [facility], [place] [address] [place], [place] [address]    Report Status PENDING  Incomplete         Radiology Studies: CT HEAD WO CONTRAST  Result Date: 12/16/2020 CLINICAL DATA:  Mental status change. EXAM: CT HEAD WITHOUT CONTRAST TECHNIQUE: Contiguous axial images were obtained from the base of the skull through the vertex without intravenous contrast. COMPARISON:  MRI head June 11th, 2012 FINDINGS: Brain: Abnormal hypoattenuation right frontal lobe, which appears to predominantly involve white matter. This finding is new relative to remote MRI from 2012. No substantial mass effect. No midline shift. No acute hemorrhage. No hydrocephalus. No extra-axial fluid collection. Partially  empty and expanded sella. Vascular: Calcific atherosclerosis. No hyperdense vessel identified. Skull: No acute fracture. Sinuses/Orbits: Visualized sinuses are clear.  Unremarkable orbits. Other: No mastoid effusions. IMPRESSION: Abnormal hypoattenuation in the right frontal lobe, which appears to predominantly involve the white matter. While this could relate to prior ischemia, findings are nonspecific by CT. Recommend MRI (with contrast if the patient is able) to further evaluate and exclude other etiologies. These results will be called to the ordering clinician or representative by the Radiologist Assistant, and communication documented in the PACS or [employer]. Electronically Signed   By: Margaretha Sheffield MD   On: 12/16/2020 15:38   MR ANGIO HEAD WO CONTRAST  Result Date: 12/17/2020 CLINICAL DATA:  Confusion. Stroke suspected. Right frontal abnormality seen by previous CT. EXAM: MRI HEAD WITHOUT CONTRAST MRA HEAD WITHOUT CONTRAST TECHNIQUE: Multiplanar, multiecho pulse sequences of the brain and surrounding structures were obtained without intravenous contrast. Angiographic images of the head were obtained using MRA technique without contrast. COMPARISON:  Head CT yesterday. FINDINGS: MRI HEAD FINDINGS Brain: Diffusion imaging does not show any acute or subacute infarction. No focal abnormality affects the brainstem. 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 Mild chronic small-vessel ischemic change seen elsewhere affecting the hemispheric white matter. No evidence of hemorrhage, hydrocephalus or extra-axial collection. Vascular: Major vessels at the base of the brain show flow. Skull and upper cervical spine: Negative Sinuses/Orbits: Clear/normal Other: None MRA HEAD FINDINGS Both internal carotid arteries widely patent through the skull base and siphon regions. The  anterior and middle cerebral vessels are patent. No large or medium vessel occlusion identified. There is some distal vessel atherosclerotic irregularity. Both vertebral arteries are widely patent to the basilar. No basilar stenosis. Posterior circulation branch vessels are patent. Distal PCA branches show some atherosclerotic irregularity. IMPRESSION: 1. No acute finding by MRI. 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 This explains the CT finding. 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 Mild chronic small-vessel ischemic change elsewhere affecting the brain. 2. No large or medium vessel occlusion or correctable proximal stenosis. Distal vessel atherosclerotic irregularity diffusely. Electronically Signed   By: Nelson Chimes M.D.   On: 12/17/2020 16:57   MR BRAIN WO CONTRAST  Result Date: 12/17/2020 CLINICAL DATA:  Confusion. Stroke suspected. Right frontal abnormality seen by previous CT. EXAM: MRI HEAD WITHOUT CONTRAST MRA HEAD WITHOUT CONTRAST TECHNIQUE: Multiplanar, multiecho pulse sequences of the brain and surrounding structures were obtained without intravenous contrast. Angiographic images of the head were obtained using MRA technique without contrast. COMPARISON:  Head CT yesterday. FINDINGS: MRI HEAD FINDINGS Brain: Diffusion imaging does not show any acute or subacute infarction. No focal abnormality affects the brainstem. Old  small 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 Mild chronic small-vessel ischemic change seen elsewhere affecting the hemispheric white matter. No evidence of hemorrhage, hydrocephalus or extra-axial collection. Vascular: Major vessels at the base of the brain show flow. Skull and upper cervical spine: Negative Sinuses/Orbits: Clear/normal Other: None MRA HEAD FINDINGS Both internal carotid  arteries widely patent through the skull base and siphon regions. The anterior and middle cerebral vessels are patent. No large or medium vessel occlusion identified. There is some distal vessel atherosclerotic irregularity. Both vertebral arteries are widely patent to the basilar. No basilar stenosis. Posterior circulation branch vessels are patent. Distal PCA branches show some atherosclerotic irregularity. IMPRESSION: 1. No acute finding by MRI. 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 This explains the CT finding. 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 Mild chronic small-vessel ischemic change elsewhere affecting the brain. 2. No large or medium vessel occlusion or correctable proximal stenosis. Distal vessel atherosclerotic irregularity diffusely. Electronically Signed   By: Nelson Chimes M.D.   On: 12/17/2020 16:57   DG Abd 2 Views  Result Date: 12/16/2020 CLINICAL DATA:  Ileus EXAM: ABDOMEN - 2 VIEW COMPARISON:  CT angiography of the abdomen and pelvis from December 14, 2020 FINDINGS: Peritoneal dialysis catheter projects over the RIGHT and central abdomen tracking towards the LEFT pelvis as on recent imaging. Signs of vascular disease and prior LEFT renal artery stenting as well as postoperative changes in the RIGHT upper quadrant as on the previous study. Since the prior exam a stent is been placed along the expected course of the SMA. Nonspecific bowel gas pattern, no overt signs of obstruction. Scattered gas-filled loops of bowel throughout the abdomen, relative paucity of gas. Anastomotic changes in the pelvis from prior colonic surgery. Decubitus view without signs of free air. On limited assessment no acute skeletal process. IMPRESSION: 1. Nonspecific bowel gas pattern without overt signs of obstruction or free air. 2. Peritoneal dialysis catheter remains in place. Electronically Signed   By: Zetta Bills M.D.   On: 12/16/2020 15:08         Scheduled Meds: . aspirin  81 mg Oral Daily  . Chlorhexidine Gluconate Cloth  6 each Topical Daily  . diclofenac Sodium  2 g Topical QID  . escitalopram  10 mg Oral Daily  . fluticasone furoate-vilanterol  1 puff Inhalation Daily   And  . umeclidinium bromide  1 puff Inhalation Daily  . gentamicin cream  1 application Topical Daily  . heparin  5,000 Units Subcutaneous Q8H  . insulin aspart  0-5 Units Subcutaneous QHS  . insulin aspart  0-6 Units Subcutaneous TID WC  . mirtazapine  7.5 mg Oral QHS  . pantoprazole (PROTONIX) IV  40 mg Intravenous Q24H  . potassium chloride  20 mEq Oral Daily  . sodium phosphate  2 enema Rectal UD   Continuous Infusions: . sodium chloride Stopped (12/17/20 0357)  . sodium chloride 10 mL/hr at 12/17/20 1216  . sodium chloride    . ampicillin-sulbactam (UNASYN) IV Stopped (12/17/20 1840)  . dialysis solution 1.5% low-MG/low-CA    . lactated ringers Stopped (12/18/20 0519)  . norepinephrine (LEVOPHED) Adult infusion 2 mcg/min (12/18/20 0928)  . potassium chloride       LOS: 6 days    Time spent: 34 minutes, more than 50% time spent on direct patient care.    Sharen Hones,  MD [facility]   To contact the attending provider between 7A-7P or the covering provider during after hours 7P-7A, please log into the web site [email or web address] and access using universal [place] password for that web site. If you do not have the password, please call the hospital operator.  12/18/2020, 10:04 AM

## 2020-12-18 NOTE — Progress Notes (Addendum)
[facility]) Hospital Liaison RN note:  Received request from Kathie Rhodes, NP for hospice services at home after discharge. Chart and patient information under review by [facility] physician and hospice eligibility has been approved.  Spoke with daughter, [name] to initiate education related to hospice philosophy, services and to answer any questions. Vera had questions and concerns regarding taking care of patient once she got home. Plan is to discharge home once DME can be delivered. DME is supposed to be delivered by 4:30 today. Per [name], she prefers to bring patient home in the am if needed to make sure an [facility] RN will be present.  DME needs discussed. Family requests a hospital bed, OBT and O2 PRN. Address has been verified and is correct on the facesheet. Contact is Vera to arrange for equipment delivery.  Please send signed and completed DNR home with patient/family. Please provide prescriptions at discharge as needed to ensure ongoing symptom management.  Please call with any hospice related questions or concerns.   Thank you for the opportunity to participate in this patient's care.  Zandra Abts, RN [facility] Liaison [phone]

## 2020-12-18 NOTE — Progress Notes (Signed)
Enteric precautions discontinued yesterday afternoon after discussion with Dr.Ravishankar. Antigen positive but toxin negative. Sigmoidoscopy does not suggest cdiff. Oral vanco also discontinued.

## 2020-12-18 NOTE — Plan of Care (Signed)
Pt transitioned to comfort care today. Levo and LR fluids off, 2L O2 remains on for comfort. Patient family has been cycling through visiting over the day. Patient smiling, interactive and enjoying time with her grandchildren, children, husband. Denies pain or discomfort at this time. Asleep when not engaged with family. Plan to d/c home on hospice when necessary equipment and RN made available at home, expected time frame apparently tmrw am.   Problem: Education: Goal: Knowledge of General Education information will improve Description: Including pain rating scale, medication(s)/side effects and non-pharmacologic comfort measures Outcome: Progressing   Problem: Coping: Goal: Level of anxiety will decrease Outcome: Progressing   Problem: Pain Managment: Goal: General experience of comfort will improve Outcome: Progressing   Problem: Skin Integrity: Goal: Risk for impaired skin integrity will decrease Outcome: Progressing   Problem: Education: Goal: Knowledge of the prescribed therapeutic regimen will improve Outcome: Progressing   Problem: Coping: Goal: Ability to identify and develop effective coping behavior will improve Outcome: Progressing   Problem: Clinical Measurements: Goal: Quality of life will improve Outcome: Progressing   Problem: Respiratory: Goal: Verbalizations of increased ease of respirations will increase Outcome: Progressing   Problem: Role Relationship: Goal: Family's ability to cope with current situation will improve Outcome: Progressing Goal: Ability to verbalize concerns, feelings, and thoughts to partner or family member will improve Outcome: Progressing   Problem: Pain Management: Goal: Satisfaction with pain management regimen will improve Outcome: Progressing   Problem: Health Behavior/Discharge Planning: Goal: Ability to manage health-related needs will improve Outcome: Not Applicable   Problem: Clinical Measurements: Goal: Ability to  maintain clinical measurements within normal limits will improve Outcome: Not Applicable Goal: Will remain free from infection Outcome: Not Applicable Goal: Diagnostic test results will improve Outcome: Not Applicable Goal: Respiratory complications will improve Outcome: Not Applicable Goal: Cardiovascular complication will be avoided Outcome: Not Applicable   Problem: Activity: Goal: Risk for activity intolerance will decrease Outcome: Not Applicable   Problem: Nutrition: Goal: Adequate nutrition will be maintained Outcome: Not Applicable   Problem: Elimination: Goal: Will not experience complications related to bowel motility Outcome: Not Applicable Goal: Will not experience complications related to urinary retention Outcome: Not Applicable   Problem: Safety: Goal: Ability to remain free from injury will improve Outcome: Not Applicable

## 2020-12-18 NOTE — Progress Notes (Signed)
Nutrition Brief Follow-Up Note  Chart reviewed. Patient now transitioning to comfort care.   No further nutrition interventions warranted at this time. Please consult RD as needed.   Henessy Rohrer King, MS, RD, LDN Pager number available on Amion 

## 2020-12-18 NOTE — Progress Notes (Signed)
SLP Cancellation Note  Patient Details Name: Anita Wells MRN: [id number] DOB: [birth year]   Cancelled treatment:       Reason Eval/Treat Not Completed: Discussed swallow evaluation with RN, patient remains lethargic but does awaken with stimulation. Sigmoidoscopy revealed ichemic bowel with ulcerations; palliative care consult is pending. SLP to hold swallow evaluation at this time, follow up if needs this afternoon. Contact SLP if needs arise sooner.  Deneise Lever, [place], [place] Speech-Language Pathologist [phone]  Anita Wells 12/18/2020, 9:24 AM

## 2020-12-18 NOTE — Consult Note (Signed)
Consultation Note Date: 12/18/2020   Patient Name: Anita Wells  DOB: 1941-01-25  MRN: [id number]  Age / Sex: 80 y.o., female  PCP: Dion Body, MD Referring Physician: Sharen Hones, MD  Reason for Consultation: Establishing goals of care  HPI/Patient Profile: 80 y.o. 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 Patient recently hospitalized for acute colitis 12/05/20-12/08/20. Sigmoidoscopy on 2/23 revealed diffuse ulceration in colon consistent with severe ischemia. On morning of 2/24 patient developed significant hypotension. Family opted for comfort measures only. PMT to assist.   Clinical Assessment and Goals of Care: I have reviewed medical records including EPIC notes, labs and imaging, received report from RN, assessed the patient and then met with patient's daughters and spouse to discuss diagnosis prognosis, GOC, EOL wishes, disposition and options.  I introduced Palliative Medicine as specialized medical care for people living with serious illness. It focuses on providing relief from the symptoms and stress of a serious illness. The goal is to improve quality of life for both the patient and the family.   We discussed patient's current illness and what it means in the larger context of patient's on-going co-morbidities.  Natural disease trajectory and expectations at EOL were discussed. Family understands situation. Family understands poor prognosis. We discussed uncertainty of time but days is likely. They express understanding. We discuss comfort focused interventions and they express understanding.   I attempted to elicit values and goals of care important to the  patient.  They tell me it is most important to have patient at home before she passes away.   Family asks about dialysis - we discuss focusing on comfort, avoiding unnecessary procedures, and that dialysis will not affect ultimate outcome.  Hospice services outpatient were explained and offered. Family is interested in support of hospice to transition patient home as soon as possible.   Questions and concerns were addressed. The family was encouraged to call with questions or concerns.   Primary Decision Maker NEXT OF KIN - spouse and children making decisions together  SUMMARY OF RECOMMENDATIONS   - home with hospice asap, hospice liaison and TOC aware - need hospital bed  Code Status/Advance Care Planning:  DNR  Prognosis:   Hours - Days  Discharge Planning: Home with Hospice      Primary Diagnoses: Present on Admission: . Colitis   I have reviewed the medical record, interviewed the patient and family, and examined the patient. The following aspects are pertinent.  Past Medical History:  Diagnosis Date  . Anemia   . Barrett's esophagus   . Carotid stenosis, bilateral   . Chronic kidney disease   . GERD (gastroesophageal reflux disease)   . Hyperlipidemia   . Hyperparathyroidism ([place])   . Hypertension   . Renal artery stenosis (HCC)    left   Social History   Socioeconomic History  . Marital status: Married    Spouse name: Not on file  . Number of children: Not on file  . Years of education: Not on file  . Highest education level: Not on file  Occupational History  . Not on file  Tobacco Use  . Smoking status: Current Every Day Smoker    Packs/day: 0.25    Years: 50.00    Pack years: 12.50    Types: Cigarettes  . Smokeless  tobacco: Never Used  . Tobacco comment: She states she is ready to quit 09/15/2020.  Vaping Use  . Vaping Use: Never used  Substance and Sexual Activity  . Alcohol use: Not Currently  . Drug use: Never  . Sexual activity: Not  on file  Other Topics Concern  . Not on file  Social History Narrative  . Not on file   Social Determinants of Health   Financial Resource Strain: Not on file  Food Insecurity: Not on file  Transportation Needs: Not on file  Physical Activity: Not on file  Stress: Not on file  Social Connections: Not on file   Family History  Problem Relation Age of Onset  . Diabetes Mellitus II Mother   . Hypertension Mother   . Alcohol abuse Father    Scheduled Meds: . Chlorhexidine Gluconate Cloth  6 each Topical Daily  . fluticasone furoate-vilanterol  1 puff Inhalation Daily   And  . umeclidinium bromide  1 puff Inhalation Daily  . gentamicin cream  1 application Topical Daily   Continuous Infusions: . sodium chloride Stopped (12/17/20 0357)  . sodium chloride 10 mL/hr at 12/17/20 1216  . sodium chloride     PRN Meds:.sodium chloride, acetaminophen **OR** acetaminophen, albuterol, fentaNYL (SUBLIMAZE) injection, glycopyrrolate **OR** glycopyrrolate **OR** glycopyrrolate, haloperidol **OR** haloperidol **OR** haloperidol lactate, morphine injection, morphine CONCENTRATE **OR** morphine CONCENTRATE, ondansetron **OR** ondansetron (ZOFRAN) IV, oxyCODONE-acetaminophen Allergies  Allergen Reactions  . Clopidogrel Swelling    Mouth swelling  . Ativan [Lorazepam] Other (See Comments)    Hypersensitivity, very very sensitive to ativan doses  . Ace Inhibitors Cough  . Amlodipine Other (See Comments)    Peripheral edema (tolerates low dose)   Review of Systems  Unable to perform ROS: Mental status change    Physical Exam Constitutional:      Comments: Lethargic, interacts when prompted  Cardiovascular:     Rate and Rhythm: Normal rate.  Pulmonary:     Effort: Pulmonary effort is normal.  Skin:    General: Skin is warm and dry.  Neurological:     Mental Status: She is disoriented.     Vital Signs: BP 129/73 (BP Location: Right Arm)   Pulse 71   Temp (!) 97.4 F (36.3 C)  (Axillary)   Resp 12   Ht _0  (1.676 m)   Wt 61.6 kg   SpO2 100%   BMI 21.92 kg/m  Pain Scale: 0-10 POSS *See Group Information*: S-Acceptable,Sleep, easy to arouse Pain Score: 0-No pain   SpO2: SpO2: 100 % O2 Device:SpO2: 100 % O2 Flow Rate: .O2 Flow Rate (L/min): 2 L/min  IO: Intake/output summary:   Intake/Output Summary (Last 24 hours) at 12/18/2020 1220 Last data filed at 12/18/2020 1138 Gross per 24 hour  Intake 1541.21 ml  Output --  Net 1541.21 ml    LBM: Last BM Date: 12/16/20 Baseline Weight: Weight: 54.4 kg Most recent weight: Weight: 61.6 kg     Palliative Assessment/Data: PPS 10%    Time Total: 60 minutes Greater than 50%  of this time was spent counseling and coordinating care related to the above assessment and plan.  Juel Burrow, DNP, AGNP-C Palliative Medicine Team [phone] Pager: [phone]

## 2020-12-18 NOTE — Progress Notes (Signed)
Dr. Earleen Newport in to see patient and family, Dr. Earleen Newport sent a secure chat to transfer patient to ICU for levophed drip due to low BP . This RN  Went to patient's room to reassess BP . IV fluid bolus was started as ordered. Report called to Clarise Cruz the ICU charge nurse. Patient transferred to ICU [place]

## 2020-12-18 NOTE — Progress Notes (Signed)
SLP Cancellation Note  Patient Details Name: Anita Wells MRN: [id number] DOB: Feb 07, 1941   Cancelled treatment:        Per MD, pt transitioning to comfort measures; SLP orders discontinued.    Deneise Lever, [place], CCC-SLP Speech-Language Pathologist                                                                                                 Aliene Altes 12/18/2020, 11:00 AM

## 2020-12-19 ENCOUNTER — Ambulatory Visit: Payer: Medicare Other

## 2020-12-19 MED ORDER — HALOPERIDOL 0.5 MG PO TABS: 0.5000 mg | ORAL_TABLET | ORAL | 0 refills | Status: AC | PRN

## 2020-12-19 MED ORDER — ONDANSETRON 4 MG PO TBDP: 4.0000 mg | ORAL_TABLET | Freq: Four times a day (QID) | ORAL | 0 refills | Status: AC | PRN

## 2020-12-19 MED ORDER — GLYCOPYRROLATE 1 MG PO TABS: 1.0000 mg | ORAL_TABLET | ORAL | 0 refills | Status: AC | PRN

## 2020-12-19 MED ORDER — MORPHINE SULFATE (CONCENTRATE) 10 MG/0.5ML PO SOLN: 5.0000 mg | ORAL | 0 refills | Status: AC | PRN

## 2020-12-19 NOTE — Progress Notes (Signed)
[facility]) Hospital Liaison RN note:  Spoke with daughter, [name] over the phone. All DME is in place and she has no questions. Plan is to discharge home today by [facility] between 2:30-3pm. Hospice admission nurse will be at home by 4:30pm. Hospital care team is aware.  Please send signed and completed DNR home with patient. Please provide any prescriptions at discharge as needed for ongoing symptom management. I will fax the discharge summary once it is available.  Please call with any hospice related questions or concerns.  Thank you for the opportunity to participate in this patient's care.  Zandra Abts, RN [facility] Liaison [phone]

## 2020-12-19 NOTE — TOC Transition Note (Signed)
Transition of Care [facility]) - CM/SW Discharge Note   Patient Details  Name: Anita Wells MRN: [id number] Date of Birth: 16-Oct-1941  Transition of Care [facility]) CM/SW Contact:  Shelbie Ammons, RN Phone Number: 12/19/2020, 8:58 AM   Clinical Narrative:   Patient will discharge to home this afternoon to be opened to hospice services through [facility]. RNCM completed ambulance transport paperwork and printed to floor. Spoke with patient's daughter Anita Wells to confirm address. [facility] transport arranged for 2:30-3pm.           Patient Goals and CMS Choice        Discharge Placement                       Discharge Plan and Services                                     Social Determinants of Health (SDOH) Interventions     Readmission Risk Interventions Readmission Risk Prevention Plan 12/13/2019  Transportation Screening Complete  Medication Review ([place]) Complete  PCP or Specialist appointment within 3-5 days of discharge Complete  HRI or [place] Not Applicable  Some recent data might be hidden

## 2020-12-19 NOTE — Discharge Summary (Addendum)
Physician Discharge Summary  Patient ID: Anita Wells MRN: [id number] DOB/AGE: 1941/07/22 80 y.o.  Admit date: 12/12/2020 Discharge date: 12/19/2020  Admission Diagnoses:  Discharge Diagnoses:  Active Problems:   Type 2 diabetes mellitus with ESRD (end-stage renal disease) (HCC)   Colitis   Lactic acidosis   Acute pancreatitis without infection or necrosis   Hypotension   Acute metabolic encephalopathy   Shock ([place])   PVD (peripheral vascular disease) (HCC)   Ischemia, bowel ([place])   Discharged Condition: Terminal  Hospital Course:  Patient with end-stage renal disease on peritoneal dialysis,peripheral vascular disease. Patient had a recent hospitalization for acute colitis from 12/05/2020 to 12/08/2020 and was sent home on Augmentin. Patient still had pain as outpatient and came back to the hospital and readmitted on 12/12/2020.Marland Kitchen Patient had initial CT scan that was consistent with colitis. We repeated a CT angio that did show blockage of the celiac artery and blockages in the SMA and IMA. The patient had worsening mental status and became hypotensive and was transferred to the ICU on 12/15/2020 for Levophed drip. Dr. Lucky Cowboy took to the vascular lab and did an angioplasty on the SMA lesion. Sigmoidoscopy was performed on 2/23, reviewed diffuse ulceration in the colon consistent withsevereischemia. 2/24.  Patient developed significant hypotension this morning, given 50 g albumin as well as Solu-Cortef.  Patient will be seen by palliative care today, consider comfort care.  #1. Hypovolemic shock secondary to bowel ischemia. Bowel ischemia status post SMA angioplasty. Leukemoid reaction secondary to bowel ischemia. Patient had recurrent hypotension, not able to receive any nutrition.  She will need total colectomy for severe ischemia, but she will not be able to survive the surgery.  Decision was made to go with comfort care.  Patient has been separate for home hospice.  Prognosis is  probably 1 week.  #2.  End-stage renal disease on peritoneal dialysis. Hyponatremia. As above  3.  Uncontrolled type 2 diabetes with hyperglycemia. No need for insulin.  4.  C. difficile colonization.  5.  COPD.  Addendum: 2/28. Sepsis ruled out.   Consults: General surgery, ID, nephrology, Pulmonology  Significant Diagnostic Studies:   Treatments: Antibiotics, peritoneal dialysis  Discharge Exam: Blood pressure 117/78, pulse (!) 37, temperature (!) 97.5 F (36.4 C), temperature source Axillary, resp. rate 10, height '5\' 6"'$  (1.676 m), weight 61.6 kg, SpO2 (!) 74 %. General appearance: alert, cooperative and comfortable, ill appearing Resp: clear to auscultation bilaterally Cardio: Regular, tachycadiac GI: soft, mild distention, tender, decreased bowel sounds Extremities: no edema  Disposition: Discharge disposition: 50-Hospice/Home       Discharge Instructions    Diet general   Complete by: As directed    As tolerated for comfort only   Discharge wound care:   Complete by: As directed    Hospice will follow   Increase activity slowly   Complete by: As directed      Allergies as of 12/19/2020      Reactions   Clopidogrel Swelling   Mouth swelling   Ativan [lorazepam] Other (See Comments)   Hypersensitivity, very very sensitive to ativan doses   Ace Inhibitors Cough   Amlodipine Other (See Comments)   Peripheral edema (tolerates low dose)      Medication List    STOP taking these medications   amLODipine 2.5 MG tablet Commonly known as: NORVASC   amoxicillin-clavulanate 500-125 MG tablet Commonly known as: AUGMENTIN   aspirin 81 MG chewable tablet   atorvastatin 40 MG tablet Commonly known as:  LIPITOR   calcium acetate 667 MG capsule Commonly known as: PHOSLO   escitalopram 10 MG tablet Commonly known as: LEXAPRO   Fluticasone-Salmeterol 250-50 MCG/DOSE Aepb Commonly known as: ADVAIR   insulin glargine 100 UNIT/ML injection Commonly  known as: LANTUS   mirtazapine 7.5 MG tablet Commonly known as: REMERON   multivitamin Tabs tablet   omeprazole 40 MG capsule Commonly known as: PRILOSEC   Potassium Chloride ER 20 MEQ Tbcr   Trelegy Ellipta 100-62.5-25 MCG/INH Aepb Generic drug: Fluticasone-Umeclidin-Vilant     TAKE these medications   albuterol 108 (90 Base) MCG/ACT inhaler Commonly known as: VENTOLIN HFA Inhale into the lungs.   diphenhydrAMINE 25 MG tablet Commonly known as: BENADRYL Take 25 mg by mouth at bedtime as needed.   glycopyrrolate 1 MG tablet Commonly known as: ROBINUL Take 1 tablet (1 mg total) by mouth every 4 (four) hours as needed (excessive secretions).   haloperidol 0.5 MG tablet Commonly known as: HALDOL Take 1 tablet (0.5 mg total) by mouth every 4 (four) hours as needed for agitation (or delirium).   morphine CONCENTRATE 10 MG/0.5ML Soln concentrated solution Place 0.25 mLs (5 mg total) under the tongue every 2 (two) hours as needed for moderate pain (or dyspnea).   ondansetron 4 MG disintegrating tablet Commonly known as: ZOFRAN-ODT Take 1 tablet (4 mg total) by mouth every 6 (six) hours as needed for nausea.   oxyCODONE-acetaminophen 5-325 MG tablet Commonly known as: PERCOCET/ROXICET Take 1 tablet by mouth every 8 (eight) hours as needed for severe pain.            Discharge Care Instructions  (From admission, onward)         Start     Ordered   12/19/20 0000  Discharge wound care:       Comments: Hospice will follow   12/19/20 1030          Follow-up Information    Dew, Erskine Squibb, MD Follow up in 1 month(s).   Specialties: Vascular Surgery, Radiology, Interventional Cardiology Why: Can see Dew or Arna Medici.  Will need mesenteric duplex with visit. Contact information: [place] [id number] [phone]              34 minutes Signed: Sharen Hones 12/19/2020, 10:30 AM

## 2020-12-20 LAB — BODY FLUID CULTURE W GRAM STAIN
Culture: NO GROWTH
Special Requests: NORMAL

## 2020-12-22 ENCOUNTER — Ambulatory Visit: Payer: Medicare Other

## 2020-12-24 ENCOUNTER — Ambulatory Visit: Payer: Medicare Other

## 2020-12-26 ENCOUNTER — Ambulatory Visit: Payer: Medicare Other

## 2021-01-23 DEATH — deceased

## 2021-05-14 IMAGING — DX DG CHEST 1V PORT
1 series · 1 of 1 positions shown · non-contrast
Comparison: Radiograph 04/04/2011

CLINICAL DATA: Pt came from dialysis today, pt does peritoneal
dyspnea

EXAM:
PORTABLE CHEST 1 VIEW

[chest ap]
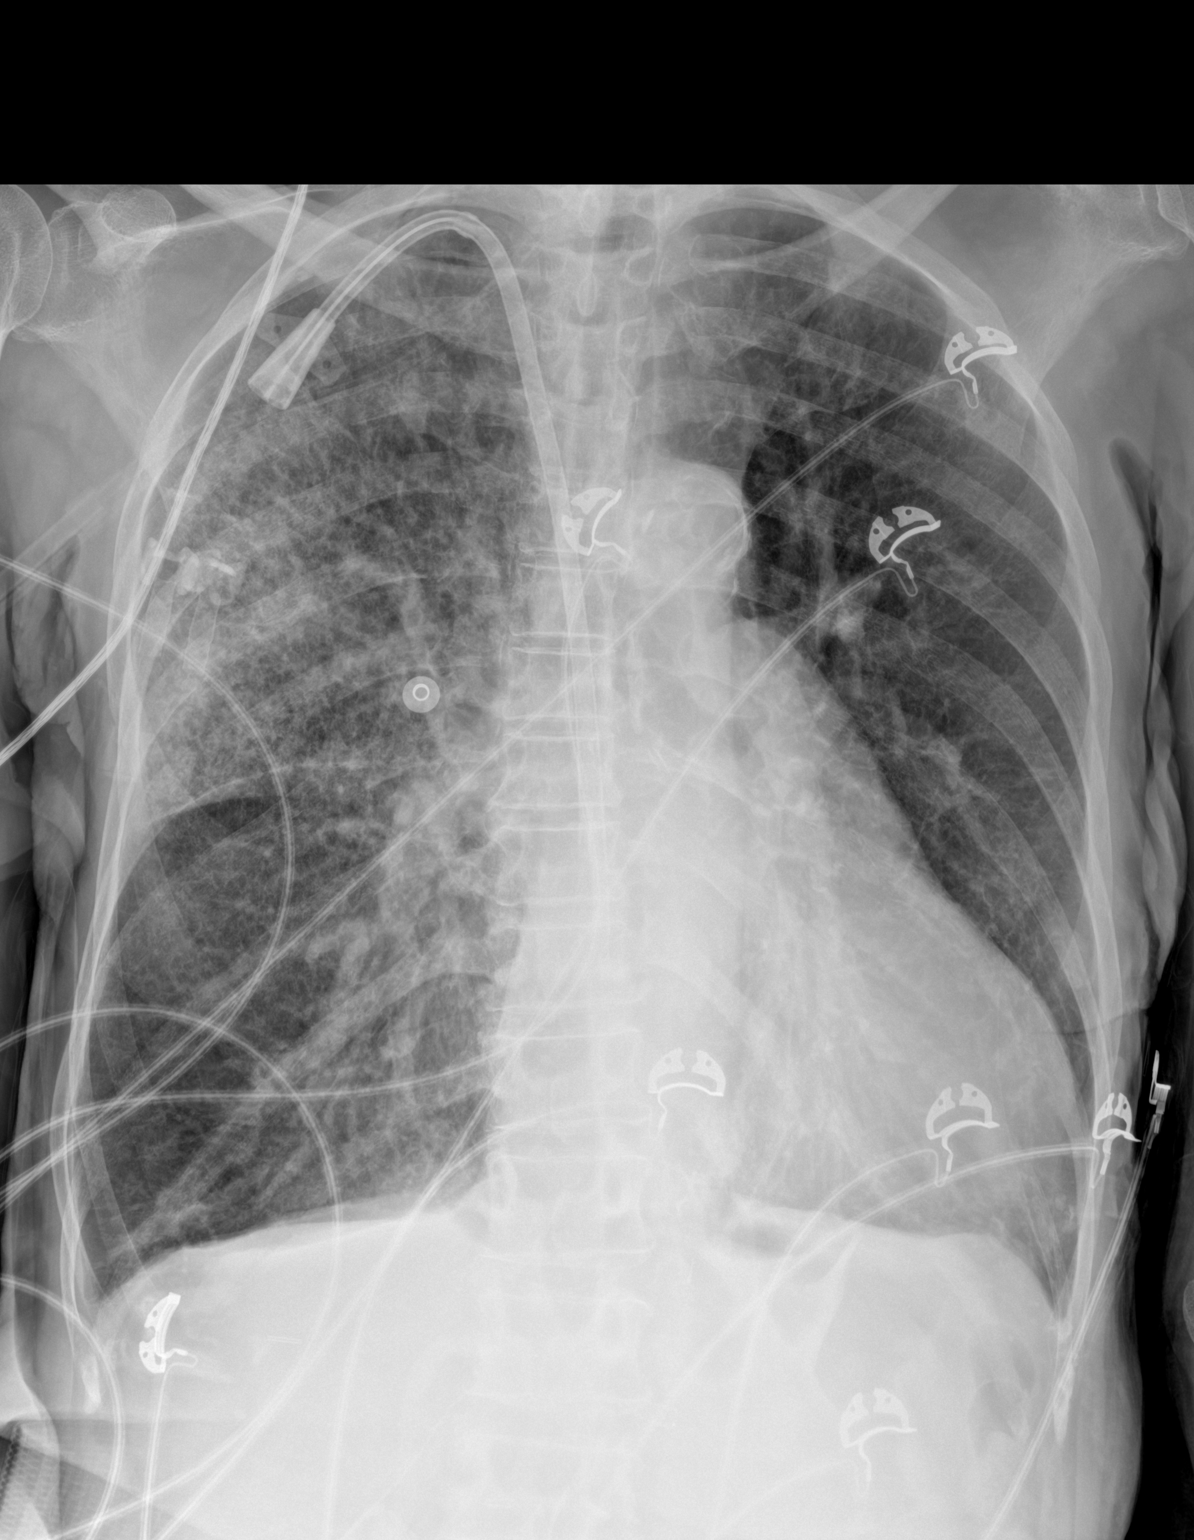

[1 of 1 positions shown; findings below may reference images not displayed]

FINDINGS: Large-bore RIGHT central venous line tips in the RIGHT atrium.
Normal cardiac silhouette. There is asymmetric diffuse airspace
disease in the RIGHT upper lobe and to a lesser degree in the RIGHT
lower lobe. Nipple shadows noted. No focal consolidation. No
pneumothorax. Lungs are hyperinflated.
IMPRESSION: Findings most suggestive of asymmetric pulmonary edema in the RIGHT
lung.

## 2022-10-05 IMAGING — MR MR MRA HEAD W/O CM
15 of 16 series · 36 of 48 positions shown · non-contrast
Comparison: Head CT yesterday.

CLINICAL DATA: Confusion. Stroke suspected. Right frontal
abnormality seen by previous CT.

EXAM:
MRI HEAD WITHOUT CONTRAST
MRA HEAD WITHOUT CONTRAST
TECHNIQUE: Multiplanar, multiecho pulse sequences of the brain and surrounding
structures were obtained without intravenous contrast. Angiographic
images of the head were obtained using MRA technique without
contrast.

[Series 9: ax dwi_tracew · axial · 3.0mm · 0.60mm/px · z∈[-106,+47]mm · 4 of 48 slices shown (1 of 2)]
[im 1/48]
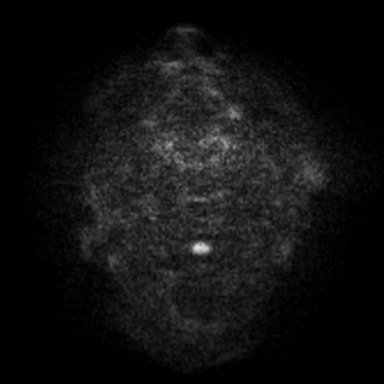
[im 16/48]
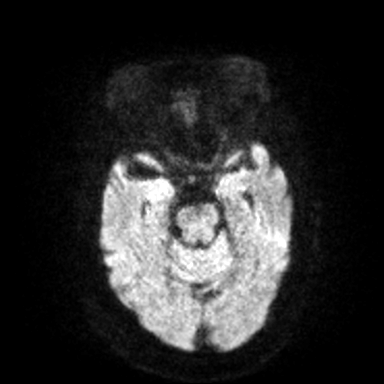
[im 32/48]
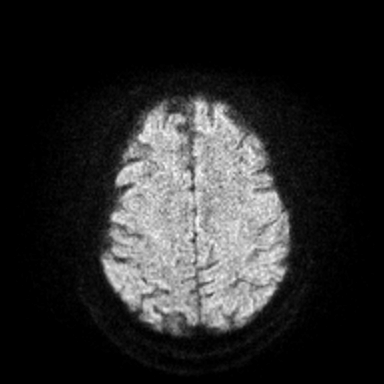
[im 48/48]
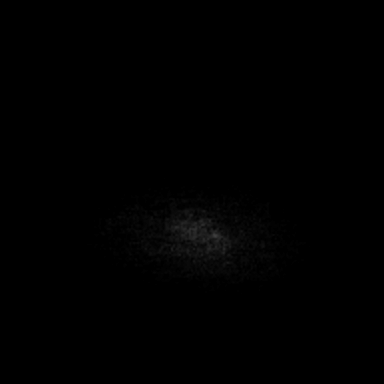

[Series 10: ax dwi_adc · axial · 3.0mm · 0.60mm/px · z∈[-106,+47]mm · 4 of 48 slices shown (1 of 2)]
[im 1/48]
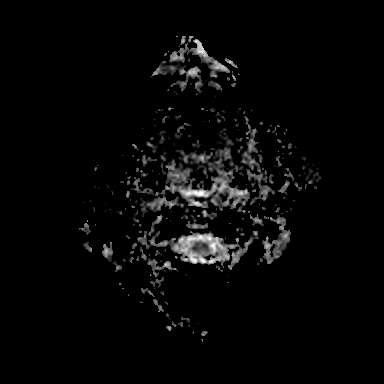
[im 16/48]
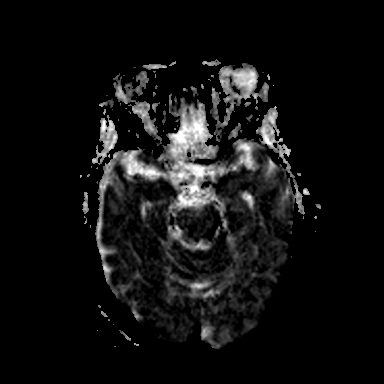
[im 32/48]
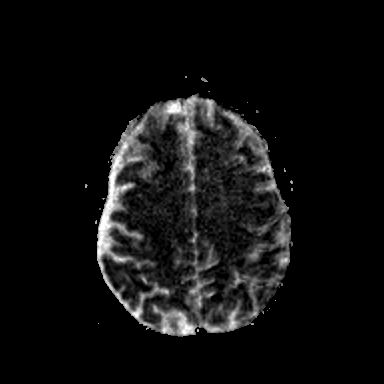
[im 48/48]
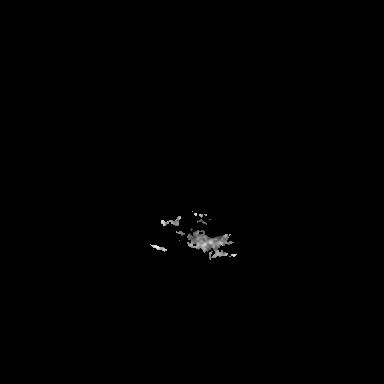

[Series 16: cor dwi_tracew · coronal · 5.0mm · 1.31mm/px · 2 of 38 slices shown (1 of 2)]
[im 1/38]
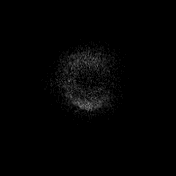
[im 38/38]
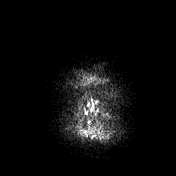

[Series 17: cor dwi_adc · coronal · 5.0mm · 1.31mm/px · 2 of 38 slices shown (1 of 2)]
[im 1/38]
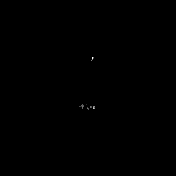
[im 38/38]
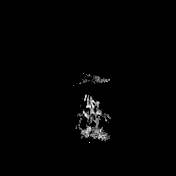

[Series 18: T1 · sagittal · 5.0mm · 0.94mm/px · 1 of 23 slices shown (1 of 3)]
[im 1/23]
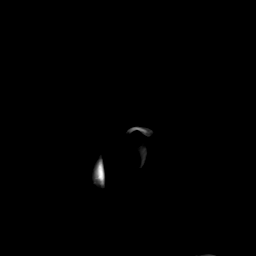

[Series 19: T2 · axial · 5.0mm · 0.45mm/px · z∈[-106,+47]mm · 2 of 27 slices shown]
[im 1/27]
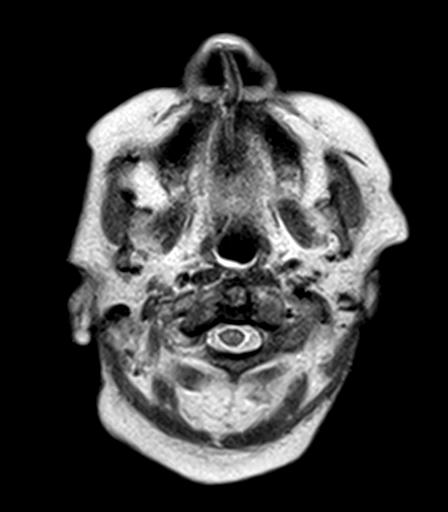
[im 27/27]
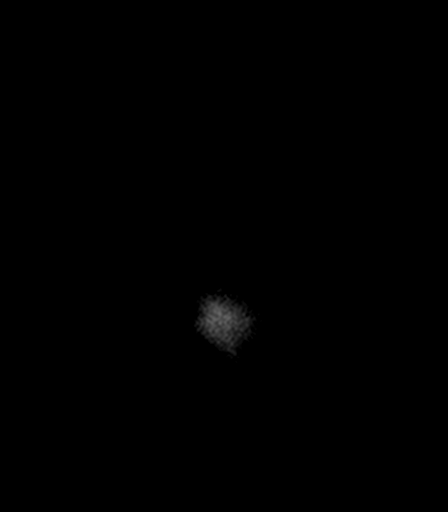

[Series 21: pha_images · axial · 3.0mm · 0.90mm/px · z∈[-108,+52]mm · 3 of 54 slices shown]
[im 1/54]
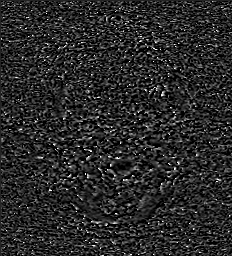
[im 27/54]
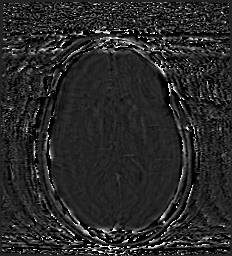
[im 54/54]
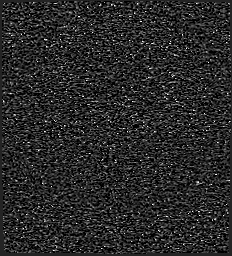

[Series 22: swi_images · axial · 3.0mm · 0.90mm/px · z∈[-111,+52]mm · 3 of 56 slices shown]
[im 1/56]
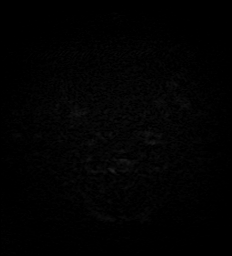
[im 28/56]
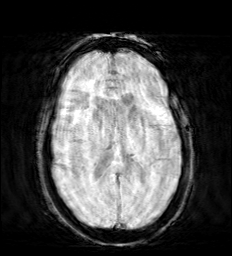
[im 56/56]
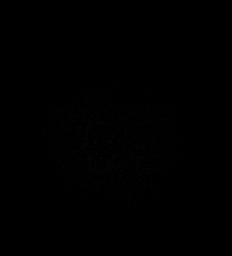

[Series 24: FLAIR · axial · 5.0mm · 1.20mm/px · z∈[-106,+48]mm · 2 of 27 slices shown]
[im 1/27]
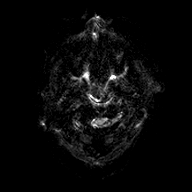
[im 27/27]
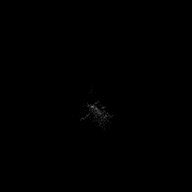

[Series 25: ax dwi_tracew · axial · 3.0mm · 1.31mm/px · z∈[-106,+47]mm · 3 of 48 slices shown (2 of 2)]
[im 1/48]
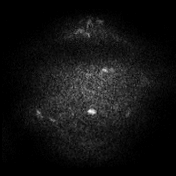
[im 24/48]
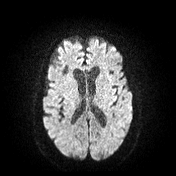
[im 48/48]
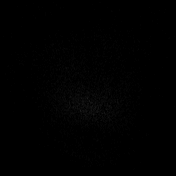

[Series 26: ax dwi_adc · axial · 3.0mm · 1.31mm/px · z∈[-106,+44]mm · 3 of 47 slices shown (2 of 2)]
[im 1/47]
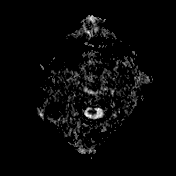
[im 24/47]
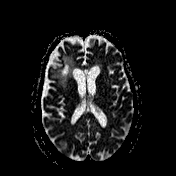
[im 47/47]
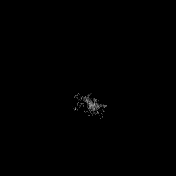

[Series 27: T1 · axial · 5.0mm · 0.90mm/px · z∈[-106,+47]mm · 2 of 27 slices shown (2 of 3)]
[im 1/27]
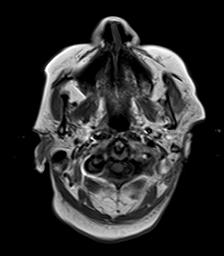
[im 27/27]
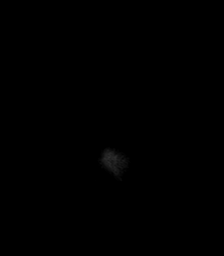

[Series 30: T1 · sagittal · 5.0mm · 0.94mm/px · 1 of 23 slices shown (3 of 3)]
[im 1/23]
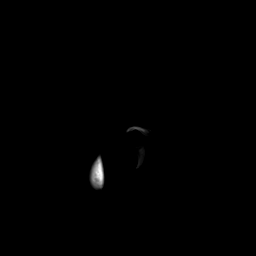

[Series 31: cor dwi_tracew · coronal · 5.0mm · 1.31mm/px · 2 of 40 slices shown (2 of 2)]
[im 1/40]
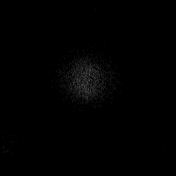
[im 40/40]
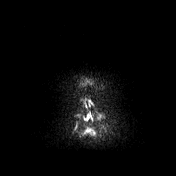

[Series 32: cor dwi_adc · coronal · 5.0mm · 1.31mm/px · 2 of 39 slices shown (2 of 2)]
[im 1/39]
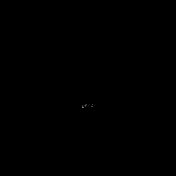
[im 39/39]
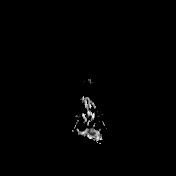

[36 of 48 positions shown; findings below may reference images not displayed]

FINDINGS: MRI HEAD FINDINGS

Brain: Diffusion imaging does not show any acute or subacute
infarction. No focal abnormality affects the brainstem. Old small
vessel cerebellar infarction on the left. Old right frontal cortical
and subcortical infarction with encephalomalacia and gliosis
explains the abnormal finding on CT. Mild chronic small-vessel
ischemic change seen elsewhere affecting the hemispheric white
matter. No evidence of hemorrhage, hydrocephalus or extra-axial
collection.

Vascular: Major vessels at the base of the brain show flow.

Skull and upper cervical spine: Negative

Sinuses/Orbits: Clear/normal

Other: None

MRA HEAD FINDINGS

Both internal carotid arteries widely patent through the skull base
and siphon regions. The anterior and middle cerebral vessels are
patent. No large or medium vessel occlusion identified. There is
some distal vessel atherosclerotic irregularity.

Both vertebral arteries are widely patent to the basilar. No basilar
stenosis. Posterior circulation branch vessels are patent. Distal
PCA branches show some atherosclerotic irregularity.
IMPRESSION: 1. No acute finding by MRI. Old right frontal cortical and
subcortical infarction with encephalomalacia and gliosis. This
explains the CT finding. Old small vessel cerebellar infarction on
the left. Mild chronic small-vessel ischemic change elsewhere
affecting the brain.
2. No large or medium vessel occlusion or correctable proximal
stenosis. Distal vessel atherosclerotic irregularity diffusely.
